# Patient Record
Sex: Female | Born: 1940 | Race: Black or African American | Hispanic: No | Smoking: Former smoker
Health system: Southern US, Community
[De-identification: ages and names within clinical notes are randomized; demographics above are authoritative.]

## PROBLEM LIST (undated history)

## (undated) DIAGNOSIS — E039 Hypothyroidism, unspecified: Secondary | ICD-10-CM

## (undated) DIAGNOSIS — N6019 Diffuse cystic mastopathy of unspecified breast: Secondary | ICD-10-CM

## (undated) DIAGNOSIS — M549 Dorsalgia, unspecified: Secondary | ICD-10-CM

## (undated) DIAGNOSIS — I872 Venous insufficiency (chronic) (peripheral): Secondary | ICD-10-CM

## (undated) DIAGNOSIS — R200 Anesthesia of skin: Secondary | ICD-10-CM

## (undated) DIAGNOSIS — D219 Benign neoplasm of connective and other soft tissue, unspecified: Secondary | ICD-10-CM

## (undated) DIAGNOSIS — I38 Endocarditis, valve unspecified: Secondary | ICD-10-CM

## (undated) DIAGNOSIS — M1990 Unspecified osteoarthritis, unspecified site: Secondary | ICD-10-CM

## (undated) DIAGNOSIS — E278 Other specified disorders of adrenal gland: Secondary | ICD-10-CM

## (undated) DIAGNOSIS — Z8679 Personal history of other diseases of the circulatory system: Secondary | ICD-10-CM

## (undated) DIAGNOSIS — M21612 Bunion of left foot: Secondary | ICD-10-CM

## (undated) DIAGNOSIS — J302 Other seasonal allergic rhinitis: Secondary | ICD-10-CM

## (undated) DIAGNOSIS — I10 Essential (primary) hypertension: Secondary | ICD-10-CM

## (undated) DIAGNOSIS — R7303 Prediabetes: Secondary | ICD-10-CM

## (undated) DIAGNOSIS — E279 Disorder of adrenal gland, unspecified: Secondary | ICD-10-CM

## (undated) DIAGNOSIS — Z8719 Personal history of other diseases of the digestive system: Secondary | ICD-10-CM

## (undated) DIAGNOSIS — Z862 Personal history of diseases of the blood and blood-forming organs and certain disorders involving the immune mechanism: Secondary | ICD-10-CM

## (undated) DIAGNOSIS — R600 Localized edema: Secondary | ICD-10-CM

## (undated) DIAGNOSIS — K5730 Diverticulosis of large intestine without perforation or abscess without bleeding: Secondary | ICD-10-CM

## (undated) DIAGNOSIS — M2042 Other hammer toe(s) (acquired), left foot: Secondary | ICD-10-CM

## (undated) DIAGNOSIS — I350 Nonrheumatic aortic (valve) stenosis: Secondary | ICD-10-CM

## (undated) DIAGNOSIS — L309 Dermatitis, unspecified: Secondary | ICD-10-CM

## (undated) DIAGNOSIS — M2550 Pain in unspecified joint: Secondary | ICD-10-CM

## (undated) DIAGNOSIS — N83209 Unspecified ovarian cyst, unspecified side: Secondary | ICD-10-CM

## (undated) DIAGNOSIS — K8020 Calculus of gallbladder without cholecystitis without obstruction: Secondary | ICD-10-CM

## (undated) HISTORY — DX: Diffuse cystic mastopathy of unspecified breast: N60.19

## (undated) HISTORY — DX: Benign neoplasm of connective and other soft tissue, unspecified: D21.9

## (undated) HISTORY — DX: Pain in unspecified joint: M25.50

## (undated) HISTORY — PX: KNEE ARTHROSCOPY: SUR90

## (undated) HISTORY — DX: Unspecified osteoarthritis, unspecified site: M19.90

## (undated) HISTORY — DX: Bunion of left foot: M21.612

## (undated) HISTORY — DX: Other hammer toe(s) (acquired), left foot: M20.42

## (undated) HISTORY — DX: Dorsalgia, unspecified: M54.9

## (undated) HISTORY — DX: Endocarditis, valve unspecified: I38

## (undated) HISTORY — DX: Other seasonal allergic rhinitis: J30.2

## (undated) HISTORY — DX: Essential (primary) hypertension: I10

## (undated) HISTORY — DX: Unspecified ovarian cyst, unspecified side: N83.209

## (undated) HISTORY — PX: SHOULDER SURGERY: SHX246

## (undated) HISTORY — PX: REDUCTION MAMMAPLASTY: SUR839

## (undated) HISTORY — DX: Dermatitis, unspecified: L30.9

## (undated) HISTORY — DX: Hypothyroidism, unspecified: E03.9

## (undated) HISTORY — DX: Personal history of other diseases of the digestive system: Z87.19

## (undated) HISTORY — PX: TUBAL LIGATION: SHX77

## (undated) HISTORY — DX: Localized edema: R60.0

## (undated) HISTORY — DX: Other specified disorders of adrenal gland: E27.8

## (undated) HISTORY — DX: Disorder of adrenal gland, unspecified: E27.9

## (undated) HISTORY — DX: Nonrheumatic aortic (valve) stenosis: I35.0

## (undated) HISTORY — PX: OTHER SURGICAL HISTORY: SHX169

---
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
## 1974-06-13 HISTORY — PX: BREAST RECONSTRUCTION: SHX9

## 1977-06-13 HISTORY — PX: BREAST EXCISIONAL BIOPSY: SUR124

## 1977-06-13 HISTORY — PX: MASTECTOMY, PARTIAL: SHX709

## 1979-06-14 HISTORY — PX: CERVICAL CONE BIOPSY: SUR198

## 2000-04-13 ENCOUNTER — Encounter: Payer: Self-pay | Admitting: Neurosurgery

## 2000-04-13 ENCOUNTER — Ambulatory Visit (HOSPITAL_COMMUNITY): Admission: RE | Admit: 2000-04-13 | Discharge: 2000-04-13 | Payer: Self-pay | Admitting: Neurosurgery

## 2001-03-14 ENCOUNTER — Ambulatory Visit (HOSPITAL_COMMUNITY): Admission: RE | Admit: 2001-03-14 | Discharge: 2001-03-14 | Payer: Self-pay | Admitting: Family Medicine

## 2001-03-14 ENCOUNTER — Encounter: Payer: Self-pay | Admitting: Family Medicine

## 2001-04-05 ENCOUNTER — Encounter: Payer: Self-pay | Admitting: Family Medicine

## 2001-04-05 ENCOUNTER — Ambulatory Visit (HOSPITAL_COMMUNITY): Admission: RE | Admit: 2001-04-05 | Discharge: 2001-04-05 | Payer: Self-pay | Admitting: Family Medicine

## 2002-01-31 ENCOUNTER — Emergency Department (HOSPITAL_COMMUNITY): Admission: EM | Admit: 2002-01-31 | Discharge: 2002-01-31 | Payer: Self-pay | Admitting: Emergency Medicine

## 2002-08-09 ENCOUNTER — Emergency Department (HOSPITAL_COMMUNITY): Admission: EM | Admit: 2002-08-09 | Discharge: 2002-08-09 | Payer: Self-pay | Admitting: Emergency Medicine

## 2002-08-09 ENCOUNTER — Encounter: Payer: Self-pay | Admitting: Emergency Medicine

## 2006-12-25 ENCOUNTER — Telehealth (INDEPENDENT_AMBULATORY_CARE_PROVIDER_SITE_OTHER): Payer: Self-pay | Admitting: *Deleted

## 2006-12-25 ENCOUNTER — Ambulatory Visit: Payer: Self-pay | Admitting: Internal Medicine

## 2006-12-25 DIAGNOSIS — N879 Dysplasia of cervix uteri, unspecified: Secondary | ICD-10-CM | POA: Insufficient documentation

## 2006-12-25 DIAGNOSIS — E039 Hypothyroidism, unspecified: Secondary | ICD-10-CM | POA: Insufficient documentation

## 2006-12-25 DIAGNOSIS — M129 Arthropathy, unspecified: Secondary | ICD-10-CM | POA: Insufficient documentation

## 2006-12-25 DIAGNOSIS — N83209 Unspecified ovarian cyst, unspecified side: Secondary | ICD-10-CM

## 2006-12-25 DIAGNOSIS — R9431 Abnormal electrocardiogram [ECG] [EKG]: Secondary | ICD-10-CM

## 2006-12-25 DIAGNOSIS — N6019 Diffuse cystic mastopathy of unspecified breast: Secondary | ICD-10-CM

## 2006-12-25 DIAGNOSIS — R609 Edema, unspecified: Secondary | ICD-10-CM

## 2006-12-26 ENCOUNTER — Encounter (INDEPENDENT_AMBULATORY_CARE_PROVIDER_SITE_OTHER): Payer: Self-pay | Admitting: Internal Medicine

## 2006-12-28 ENCOUNTER — Ambulatory Visit: Payer: Self-pay | Admitting: Internal Medicine

## 2006-12-29 ENCOUNTER — Ambulatory Visit (HOSPITAL_COMMUNITY): Admission: RE | Admit: 2006-12-29 | Discharge: 2006-12-29 | Payer: Self-pay | Admitting: Internal Medicine

## 2006-12-29 ENCOUNTER — Ambulatory Visit: Payer: Self-pay | Admitting: Cardiology

## 2006-12-29 ENCOUNTER — Encounter (INDEPENDENT_AMBULATORY_CARE_PROVIDER_SITE_OTHER): Payer: Self-pay | Admitting: Internal Medicine

## 2007-01-01 ENCOUNTER — Ambulatory Visit (HOSPITAL_COMMUNITY): Admission: RE | Admit: 2007-01-01 | Discharge: 2007-01-01 | Payer: Self-pay | Admitting: Internal Medicine

## 2007-01-01 ENCOUNTER — Telehealth (INDEPENDENT_AMBULATORY_CARE_PROVIDER_SITE_OTHER): Payer: Self-pay | Admitting: *Deleted

## 2007-01-02 ENCOUNTER — Telehealth (INDEPENDENT_AMBULATORY_CARE_PROVIDER_SITE_OTHER): Payer: Self-pay | Admitting: *Deleted

## 2007-01-02 ENCOUNTER — Encounter (INDEPENDENT_AMBULATORY_CARE_PROVIDER_SITE_OTHER): Payer: Self-pay | Admitting: Internal Medicine

## 2007-01-02 LAB — CONVERTED CEMR LAB
Alkaline Phosphatase: 74 units/L (ref 39–117)
BUN: 15 mg/dL (ref 6–23)
CO2: 24 meq/L (ref 19–32)
Cholesterol: 174 mg/dL (ref 0–200)
Creatinine, Ser: 0.79 mg/dL (ref 0.40–1.20)
Glucose, Bld: 85 mg/dL (ref 70–99)
HDL: 47 mg/dL (ref >39)
Total Bilirubin: 0.5 mg/dL (ref 0.3–1.2)
Total Protein: 6.9 g/dL (ref 6.0–8.3)
Triglycerides: 80 mg/dL (ref <150)
VLDL: 16 mg/dL (ref 0–40)

## 2007-01-04 ENCOUNTER — Ambulatory Visit: Payer: Self-pay | Admitting: Internal Medicine

## 2007-01-04 DIAGNOSIS — M76899 Other specified enthesopathies of unspecified lower limb, excluding foot: Secondary | ICD-10-CM | POA: Insufficient documentation

## 2007-01-04 DIAGNOSIS — L988 Other specified disorders of the skin and subcutaneous tissue: Secondary | ICD-10-CM | POA: Insufficient documentation

## 2007-01-05 ENCOUNTER — Encounter (INDEPENDENT_AMBULATORY_CARE_PROVIDER_SITE_OTHER): Payer: Self-pay | Admitting: Internal Medicine

## 2007-01-11 ENCOUNTER — Ambulatory Visit (HOSPITAL_COMMUNITY): Admission: RE | Admit: 2007-01-11 | Discharge: 2007-01-11 | Payer: Self-pay | Admitting: Internal Medicine

## 2007-01-12 ENCOUNTER — Telehealth (INDEPENDENT_AMBULATORY_CARE_PROVIDER_SITE_OTHER): Payer: Self-pay | Admitting: *Deleted

## 2007-01-16 ENCOUNTER — Encounter (INDEPENDENT_AMBULATORY_CARE_PROVIDER_SITE_OTHER): Payer: Self-pay | Admitting: Internal Medicine

## 2007-01-29 ENCOUNTER — Telehealth (INDEPENDENT_AMBULATORY_CARE_PROVIDER_SITE_OTHER): Payer: Self-pay | Admitting: *Deleted

## 2007-01-31 ENCOUNTER — Telehealth (INDEPENDENT_AMBULATORY_CARE_PROVIDER_SITE_OTHER): Payer: Self-pay | Admitting: *Deleted

## 2007-01-31 ENCOUNTER — Encounter (INDEPENDENT_AMBULATORY_CARE_PROVIDER_SITE_OTHER): Payer: Self-pay | Admitting: Internal Medicine

## 2007-02-05 ENCOUNTER — Telehealth (INDEPENDENT_AMBULATORY_CARE_PROVIDER_SITE_OTHER): Payer: Self-pay | Admitting: Internal Medicine

## 2007-02-06 ENCOUNTER — Telehealth (INDEPENDENT_AMBULATORY_CARE_PROVIDER_SITE_OTHER): Payer: Self-pay | Admitting: Internal Medicine

## 2007-02-06 ENCOUNTER — Encounter (INDEPENDENT_AMBULATORY_CARE_PROVIDER_SITE_OTHER): Payer: Self-pay | Admitting: Internal Medicine

## 2007-02-09 ENCOUNTER — Encounter (INDEPENDENT_AMBULATORY_CARE_PROVIDER_SITE_OTHER): Payer: Self-pay | Admitting: Internal Medicine

## 2007-07-10 ENCOUNTER — Ambulatory Visit: Payer: Self-pay | Admitting: Internal Medicine

## 2007-09-03 ENCOUNTER — Ambulatory Visit: Payer: Self-pay | Admitting: Internal Medicine

## 2007-09-03 DIAGNOSIS — J309 Allergic rhinitis, unspecified: Secondary | ICD-10-CM | POA: Insufficient documentation

## 2007-09-03 DIAGNOSIS — D179 Benign lipomatous neoplasm, unspecified: Secondary | ICD-10-CM | POA: Insufficient documentation

## 2007-09-06 ENCOUNTER — Encounter (INDEPENDENT_AMBULATORY_CARE_PROVIDER_SITE_OTHER): Payer: Self-pay | Admitting: Internal Medicine

## 2007-10-02 ENCOUNTER — Ambulatory Visit: Payer: Self-pay | Admitting: Internal Medicine

## 2007-10-02 DIAGNOSIS — R143 Flatulence: Secondary | ICD-10-CM

## 2007-10-02 DIAGNOSIS — H6690 Otitis media, unspecified, unspecified ear: Secondary | ICD-10-CM | POA: Insufficient documentation

## 2007-10-02 DIAGNOSIS — R142 Eructation: Secondary | ICD-10-CM

## 2007-10-02 DIAGNOSIS — R141 Gas pain: Secondary | ICD-10-CM

## 2007-10-18 ENCOUNTER — Telehealth (INDEPENDENT_AMBULATORY_CARE_PROVIDER_SITE_OTHER): Payer: Self-pay | Admitting: *Deleted

## 2007-12-19 ENCOUNTER — Telehealth (INDEPENDENT_AMBULATORY_CARE_PROVIDER_SITE_OTHER): Payer: Self-pay | Admitting: Internal Medicine

## 2007-12-25 ENCOUNTER — Ambulatory Visit: Payer: Self-pay | Admitting: Internal Medicine

## 2007-12-25 DIAGNOSIS — R209 Unspecified disturbances of skin sensation: Secondary | ICD-10-CM

## 2007-12-25 DIAGNOSIS — M79609 Pain in unspecified limb: Secondary | ICD-10-CM | POA: Insufficient documentation

## 2007-12-25 DIAGNOSIS — I10 Essential (primary) hypertension: Secondary | ICD-10-CM

## 2007-12-25 DIAGNOSIS — L259 Unspecified contact dermatitis, unspecified cause: Secondary | ICD-10-CM

## 2007-12-26 ENCOUNTER — Encounter (INDEPENDENT_AMBULATORY_CARE_PROVIDER_SITE_OTHER): Payer: Self-pay | Admitting: Internal Medicine

## 2007-12-26 LAB — CONVERTED CEMR LAB
BUN: 12 mg/dL (ref 6–23)
Cholesterol: 172 mg/dL (ref 0–200)
Creatinine, Ser: 0.83 mg/dL (ref 0.40–1.20)
Free T4: 1.58 ng/dL (ref 0.89–1.80)
Glucose, Bld: 88 mg/dL (ref 70–99)
LDL Cholesterol: 109 mg/dL — ABNORMAL HIGH (ref 0–99)
Potassium: 4.5 meq/L (ref 3.5–5.3)
VLDL: 20 mg/dL (ref 0–40)

## 2007-12-28 ENCOUNTER — Ambulatory Visit (HOSPITAL_COMMUNITY): Admission: RE | Admit: 2007-12-28 | Discharge: 2007-12-28 | Payer: Self-pay | Admitting: Internal Medicine

## 2008-01-11 ENCOUNTER — Encounter (INDEPENDENT_AMBULATORY_CARE_PROVIDER_SITE_OTHER): Payer: Self-pay | Admitting: Internal Medicine

## 2008-01-18 ENCOUNTER — Encounter (INDEPENDENT_AMBULATORY_CARE_PROVIDER_SITE_OTHER): Payer: Self-pay | Admitting: Internal Medicine

## 2008-01-22 ENCOUNTER — Telehealth: Payer: Self-pay | Admitting: Family Medicine

## 2008-01-30 ENCOUNTER — Ambulatory Visit: Payer: Self-pay | Admitting: Vascular Surgery

## 2008-02-11 ENCOUNTER — Ambulatory Visit: Payer: Self-pay | Admitting: Internal Medicine

## 2008-02-11 DIAGNOSIS — R252 Cramp and spasm: Secondary | ICD-10-CM

## 2008-02-13 ENCOUNTER — Encounter (INDEPENDENT_AMBULATORY_CARE_PROVIDER_SITE_OTHER): Payer: Self-pay | Admitting: Internal Medicine

## 2008-02-13 LAB — CONVERTED CEMR LAB
CO2: 26 meq/L (ref 19–32)
Calcium: 9.3 mg/dL (ref 8.4–10.5)
Magnesium: 2.2 mg/dL (ref 1.5–2.5)
Potassium: 4.3 meq/L (ref 3.5–5.3)
Sodium: 142 meq/L (ref 135–145)

## 2008-06-25 ENCOUNTER — Ambulatory Visit: Payer: Self-pay | Admitting: Internal Medicine

## 2008-07-23 ENCOUNTER — Encounter (INDEPENDENT_AMBULATORY_CARE_PROVIDER_SITE_OTHER): Payer: Self-pay | Admitting: Internal Medicine

## 2008-08-08 ENCOUNTER — Encounter (INDEPENDENT_AMBULATORY_CARE_PROVIDER_SITE_OTHER): Payer: Self-pay | Admitting: Internal Medicine

## 2008-08-15 ENCOUNTER — Ambulatory Visit: Payer: Self-pay | Admitting: Internal Medicine

## 2008-11-26 ENCOUNTER — Ambulatory Visit: Payer: Self-pay | Admitting: Internal Medicine

## 2008-11-26 DIAGNOSIS — N390 Urinary tract infection, site not specified: Secondary | ICD-10-CM | POA: Insufficient documentation

## 2008-11-26 LAB — CONVERTED CEMR LAB
Glucose, Urine, Semiquant: NEGATIVE
pH: 5.5

## 2008-11-27 ENCOUNTER — Encounter (INDEPENDENT_AMBULATORY_CARE_PROVIDER_SITE_OTHER): Payer: Self-pay | Admitting: Internal Medicine

## 2008-11-27 LAB — CONVERTED CEMR LAB: TSH: 2.429 microintl units/mL (ref 0.350–4.500)

## 2009-01-19 ENCOUNTER — Encounter: Payer: Self-pay | Admitting: Orthopedic Surgery

## 2009-01-19 ENCOUNTER — Ambulatory Visit (HOSPITAL_COMMUNITY): Admission: RE | Admit: 2009-01-19 | Discharge: 2009-01-19 | Payer: Self-pay | Admitting: Family Medicine

## 2009-01-22 ENCOUNTER — Encounter: Payer: Self-pay | Admitting: Orthopedic Surgery

## 2009-01-22 ENCOUNTER — Ambulatory Visit (HOSPITAL_COMMUNITY): Admission: RE | Admit: 2009-01-22 | Discharge: 2009-01-22 | Payer: Self-pay | Admitting: Family Medicine

## 2009-01-28 ENCOUNTER — Ambulatory Visit: Payer: Self-pay | Admitting: Orthopedic Surgery

## 2009-01-28 DIAGNOSIS — M25569 Pain in unspecified knee: Secondary | ICD-10-CM

## 2009-01-28 DIAGNOSIS — M1710 Unilateral primary osteoarthritis, unspecified knee: Secondary | ICD-10-CM

## 2009-01-28 DIAGNOSIS — M23302 Other meniscus derangements, unspecified lateral meniscus, unspecified knee: Secondary | ICD-10-CM

## 2009-02-04 ENCOUNTER — Telehealth: Payer: Self-pay | Admitting: Orthopedic Surgery

## 2009-02-06 ENCOUNTER — Ambulatory Visit (HOSPITAL_COMMUNITY): Admission: RE | Admit: 2009-02-06 | Discharge: 2009-02-06 | Payer: Self-pay | Admitting: Orthopedic Surgery

## 2009-02-06 ENCOUNTER — Ambulatory Visit: Payer: Self-pay | Admitting: Orthopedic Surgery

## 2009-02-11 ENCOUNTER — Encounter (HOSPITAL_COMMUNITY): Admission: RE | Admit: 2009-02-11 | Discharge: 2009-03-11 | Payer: Self-pay | Admitting: Orthopedic Surgery

## 2009-02-11 ENCOUNTER — Encounter: Payer: Self-pay | Admitting: Orthopedic Surgery

## 2009-02-13 ENCOUNTER — Encounter: Payer: Self-pay | Admitting: Orthopedic Surgery

## 2009-02-13 ENCOUNTER — Telehealth: Payer: Self-pay | Admitting: Orthopedic Surgery

## 2009-02-17 ENCOUNTER — Telehealth: Payer: Self-pay | Admitting: Orthopedic Surgery

## 2009-02-20 ENCOUNTER — Ambulatory Visit: Payer: Self-pay | Admitting: Internal Medicine

## 2009-02-24 ENCOUNTER — Ambulatory Visit: Payer: Self-pay | Admitting: Orthopedic Surgery

## 2009-03-13 ENCOUNTER — Encounter (HOSPITAL_COMMUNITY): Admission: RE | Admit: 2009-03-13 | Discharge: 2009-04-12 | Payer: Self-pay | Admitting: Orthopedic Surgery

## 2009-03-13 ENCOUNTER — Encounter: Payer: Self-pay | Admitting: Orthopedic Surgery

## 2009-04-10 ENCOUNTER — Encounter: Payer: Self-pay | Admitting: Orthopedic Surgery

## 2009-04-14 ENCOUNTER — Encounter: Payer: Self-pay | Admitting: Orthopedic Surgery

## 2009-04-27 ENCOUNTER — Ambulatory Visit: Payer: Self-pay | Admitting: Orthopedic Surgery

## 2009-07-28 ENCOUNTER — Ambulatory Visit: Payer: Self-pay | Admitting: Orthopedic Surgery

## 2010-04-13 ENCOUNTER — Encounter: Payer: Self-pay | Admitting: Orthopedic Surgery

## 2010-05-04 ENCOUNTER — Ambulatory Visit (HOSPITAL_COMMUNITY)
Admission: RE | Admit: 2010-05-04 | Discharge: 2010-05-04 | Payer: Self-pay | Source: Home / Self Care | Admitting: Family Medicine

## 2010-06-16 ENCOUNTER — Encounter: Payer: Self-pay | Admitting: Orthopedic Surgery

## 2010-06-16 ENCOUNTER — Ambulatory Visit
Admission: RE | Admit: 2010-06-16 | Discharge: 2010-06-16 | Payer: Self-pay | Source: Home / Self Care | Attending: Orthopedic Surgery | Admitting: Orthopedic Surgery

## 2010-06-23 ENCOUNTER — Encounter: Payer: Self-pay | Admitting: Orthopedic Surgery

## 2010-06-24 ENCOUNTER — Other Ambulatory Visit
Admission: RE | Admit: 2010-06-24 | Discharge: 2010-06-24 | Payer: Self-pay | Source: Home / Self Care | Admitting: Obstetrics & Gynecology

## 2010-06-25 ENCOUNTER — Telehealth: Payer: Self-pay | Admitting: Orthopedic Surgery

## 2010-06-29 ENCOUNTER — Ambulatory Visit (HOSPITAL_COMMUNITY): Admission: RE | Admit: 2010-06-29 | Payer: Self-pay | Source: Home / Self Care | Admitting: Obstetrics & Gynecology

## 2010-06-29 ENCOUNTER — Telehealth: Payer: Self-pay | Admitting: Orthopedic Surgery

## 2010-06-29 ENCOUNTER — Telehealth (INDEPENDENT_AMBULATORY_CARE_PROVIDER_SITE_OTHER): Payer: Self-pay | Admitting: *Deleted

## 2010-07-04 ENCOUNTER — Encounter: Payer: Self-pay | Admitting: Internal Medicine

## 2010-07-05 ENCOUNTER — Encounter: Payer: Self-pay | Admitting: Internal Medicine

## 2010-07-05 ENCOUNTER — Encounter: Payer: Self-pay | Admitting: Family Medicine

## 2010-07-08 ENCOUNTER — Encounter (HOSPITAL_COMMUNITY)
Admission: RE | Admit: 2010-07-08 | Discharge: 2010-07-13 | Payer: Self-pay | Source: Home / Self Care | Attending: Orthopedic Surgery | Admitting: Orthopedic Surgery

## 2010-07-13 ENCOUNTER — Telehealth: Payer: Self-pay | Admitting: Orthopedic Surgery

## 2010-07-13 NOTE — Letter (Signed)
Summary: M  M   Imported By: Jacklynn Ganong 05/13/2010 14:08:30  _____________________________________________________________________  External Attachment:    Type:   Image     Comment:   External Document

## 2010-07-13 NOTE — Assessment & Plan Note (Signed)
Summary: FOL UP LT KNEE/HAD ARTH SURG AUG2010/SEC HORIZ/CAF   Visit Type:  Follow-up Referring Provider:  Dr Loleta Chance  CC:  left knee pain.  History of Present Illness:  DOS: 8.27.2010  status post arthroscopy LEFT knee.  She wanted to do her own therapy she said she had too much to do and could not really go to the therapy sessions.  She comes back today saying her knee is tight and doesn't quite feel RIGHT and will go down.  She has a flexion contracture and lack of therapy after surgery  She has an anterior LEFT knee lipoma just superior to the patella it is bothering her and she wants it removed  She has a RIGHT shoulder lipoma and she also has a RIGHT medial ankle lipoma.  She would eventually like all of these removed.      Allergies: No Known Drug Allergies  Past History:  Past Medical History: Last updated: Feb 21, 2009 Hypothyroidism ? arthritis fibrocystic breast ovarian cyst genital herpes--with infrequent outbreaks hypertension Allergic rhinitis eczema  Family History: Last updated: 2009-02-21 father-deceased-pancreatic cancer mother-84-HTN,diet controlled DM, CVA x3, dementia brother-64-Crohns disease sister-62 sister-60-MS, DM 5 other siblings son-47 daughter-39 son-39 son-38-HTN daughter-38-hiatal hernia daughter-30 FH of Cancer:  Family History of Diabetes Family History of Arthritis  Past Surgical History: Tubal ligation-1979 L "partial mastectomy" for breast reduction breast reconstruction/? Coopers ligament [phone] cervix removal secondary to dysplasia--1993 Lipoma excision 1993--right shoulder arthroscopy LEFT knee, Dr. Romeo Apple, 2010.  Review of Systems MS:  See HPI. Derm:  See HPI.  Physical Exam  Additional Exam:  Examination of the LEFT knee there is a superior lipoma over the superior aspect of the patella over the quad tendon.  It is tender.  It is large proximally 4 cm x 3 cm its in the subcutaneous tissue.  She does  ambulate with a flexion contracture type gait.  Has no tenderness around the lateral knee or superior medial knee but the medial soft tissue structures are tender the joint lines a little tender the knee is stable she has full flexion her muscle strength is normal  RIGHT shoulder skin old scar and another lipoma under the scar.  This was previously removed 20 years ago.  Medial LEFT ankle and another lipoma large 5 x 4 cm  Reflexes are good sensation is normal she joined x3 mood is normal  Pulses are good in the upper and lower extremities  Mood and affect normal  Normal development attrition grooming hygiene body habitus large.   Impression & Recommendations:  Problem # 1:  LIPOMA (ICD-214.9) Assessment New  multiple lipomas LEFT knee, medial RIGHT ankle, RIGHT shoulder anteriorly  She will schedule removal of the LEFT knee lipoma and then removal of the shoulder and ankle lipoma and a separate sitting  Orders: Est. 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 One by mouth q 4 hrs as needed pain  Orders: Est. Patient Level IV (91478)  Patient Instructions: 1)  Please do exercises on sheet every other day 2)  Do prone hang every day for 30 minutes (the one with you laying on your stomach and letting your leg hand) 3)  call us for date to have lipoma excised from left knee

## 2010-07-14 ENCOUNTER — Encounter: Payer: Self-pay | Admitting: Obstetrics & Gynecology

## 2010-07-14 ENCOUNTER — Ambulatory Visit (HOSPITAL_COMMUNITY): Payer: Self-pay

## 2010-07-15 ENCOUNTER — Other Ambulatory Visit: Payer: Self-pay | Admitting: Obstetrics & Gynecology

## 2010-07-15 DIAGNOSIS — Z139 Encounter for screening, unspecified: Secondary | ICD-10-CM

## 2010-07-15 NOTE — Progress Notes (Signed)
Summary: phone with patient fol'g her contact to insurer  Phone Note Call from Patient   Caller: Patient Summary of Call: Call back from patient fol'g her contact to insurer on Fri, 06/25/10.  States spoke w/Barbara at [phone], and that insurance needs more clinical information.  Advised patient that denial letter received did not indicate that additional clinicals needed.  NOTE:  Case #[id number] Initial call taken by: Cammie Sickle,  June 29, 2010 6:08 PM Call placed to: Patient  Follow-up for Phone Call        Per contact w/insurer,provider line # [phone], spoke w/Ms. Sharlett Iles, clinical support specialist, transf'd to Mr. Aurora Mask, who states patient may have spoken w/either customer service or w/Appeals directly.  States patient can start a 1st level appeal if she has not already done so. Called back to patient; left voice mail message. Follow-up by: Cammie Sickle,  June 29, 2010 6:11 PM  Additional Follow-up for Phone Call Additional follow up Details #1::        Called back to patient to fol/up, left message w/ patient's brother. Additional Follow-up by: Cammie Sickle,  July 09, 2010 11:35 AM

## 2010-07-15 NOTE — Progress Notes (Signed)
Summary: spoke with patient, MRI denied by insurer  Phone Note Call from Patient   Summary of Call: 06/24/10 called patient to relay that, per insurer [employer],  MRI is denied. Lft message. 06/25/10 - patient called back.  I advised of denial per letter received-insurance company sending copy of letter to patient.   Patient will fol/up with her insurance company. Initial call taken by: Cammie Sickle,  June 25, 2010 11:10 AM

## 2010-07-15 NOTE — Letter (Signed)
Summary: HISTORY FORM   HISTORY FORM   Imported By: Eugenio Hoes 06/16/2010 12:24:13  _____________________________________________________________________  External Attachment:    Type:   Image     Comment:   External Document

## 2010-07-15 NOTE — Assessment & Plan Note (Signed)
Summary: rt knee pain/needs xray/medicare   Visit Type:  new problem Referring Provider:  Dr Loleta Chance  CC:  right knee pain.  History of Present Illness: I saw Erin Vazquez in the office today for an initial visit.  She is a 70 years old woman with the complaint of:  right knee  Medications: Levothyroxin 75 micrograms, Ibuprofen.  This is a 70 year old female had a LEFT knee arthroscopy in the past presents now with 3 weeks of severe RIGHT knee pain on the medial side with inability to ambulate without a significant limp and significant pain, which she rates 8/10 and described as sharp with locking and catching and severe swelling. Symptoms seemed to start with wearing the shape. Since that time. She has got a pair of orthotics from the [employer] store, and a new pair of shoes, and that seems to have taken away some of her calf and posterior thigh pain.    Allergies: No Known Drug Allergies  Review of Systems Constitutional:  Denies weight loss, weight gain, fever, chills, and fatigue. Cardiovascular:  Denies chest pain, palpitations, fainting, and murmurs. Respiratory:  Denies short of breath, wheezing, couch, tightness, pain on inspiration, and snoring . Gastrointestinal:  Denies heartburn, nausea, vomiting, diarrhea, constipation, and blood in your stools. Genitourinary:  Denies frequency, urgency, difficulty urinating, painful urination, flank pain, and bleeding in urine. Neurologic:  Complains of tingling; denies numbness, unsteady gait, dizziness, tremors, and seizure. Musculoskeletal:  Complains of joint pain, swelling, instability, stiffness, and muscle pain; denies redness and heat. Endocrine:  Denies excessive thirst, exessive urination, and heat or cold intolerance. Psychiatric:  Denies nervousness, depression, anxiety, and hallucinations. Skin:  Denies changes in the skin, poor healing, rash, itching, and redness. HEENT:  Denies blurred or double vision, eye pain, redness, and  watering. Immunology:  Denies seasonal allergies, sinus problems, and allergic to bee stings. Hemoatologic:  Denies easy bleeding and brusing.  Physical Exam  Additional Exam:  GEN: well developed, well nourished, normal grooming and hygiene, no deformity and normal body habitus.   CDV: pulses are normal, no edema, no erythema. no tenderness  Lymph: normal lymph nodes   Skin: no rashes, skin lesions or open sores   NEURO: normal coordination, reflexes, sensation.   Psyche: awake, alert and oriented. Mood normal   Gait: slightly abnormal with a slight limp.  RIGHT knee medial joint line tenderness is severe and significant. Her range of motion is approximately 115 with leg morphology preventing further flexion. She has positive McMurray sign. Normal motor strength and knee stability.   The upper extremities have normal appearance, ROM, strength and stability.      Impression & Recommendations:  Problem # 1:  DERANGEMENT MENISCUS (ICD-717.5)  Torn medial meniscus. Previous x-rays show symmetric joint space narrowing mild.  Recommend MRI.  Recommend Ultracet q.4 p.r.n. may take up to 2 tablets, #40, one refill  Orders: Est. Patient Level IV (29528)  Patient Instructions: 1)  mri come back for review   Orders Added: 1)  Est. Patient Level IV [41324]

## 2010-07-15 NOTE — Progress Notes (Signed)
Summary: Knee is still hurting a lot  Phone Note Call from Patient   Summary of Call: Erin Vazquez (02/26/1941) says the Tramadol has not helped her knee pain much at all.  She has been taking 800 mg  of Ibuprofen which has helped but she does not want to continue taking it because of the potential effects on her  liver.  The MRI was denied by her insurance because they require exercise and medicine to reduce swelling before an MRI is ok'd.  She says she needs to know what is wrong with her knee and asked if she can schedule another appointment or do you have other suggestions.  She is in a lot of pain.Please advise. Her # [phone] Initial call taken by: Jacklynn Ganong,  June 29, 2010 11:50 AM  Follow-up for Phone Call        continu the ibuprofen it has the least postential for problems  Follow-up by: Fuller Canada MD,  June 29, 2010 11:54 AM  Additional Follow-up for Phone Call Additional follow up Details #1::        She is concerned that she may have a torn meniscus. Has seen a chiropractor about 3 months for manipulation,electrode and home exercises and wants to know if the exercises would help or hinder a torn meniscus.  Said she will go to physical therapy if you think it would help her pain and then possibly get the MRI approved.  Said she will need pain medicine if she does PT Additional Follow-up by: Jacklynn Ganong,  June 29, 2010 12:15 PM    Additional Follow-up for Phone Call Additional follow up Details #2::    therapy is ok   again advise ibuprofen  Follow-up by: Fuller Canada MD,  June 29, 2010 12:23 PM  Additional Follow-up for Phone Call Additional follow up Details #3:: Details for Additional Follow-up Action Taken: Patient wants to go to [facility] for therapy, advised to take Ibuprofen  ok faxed order to [facility] Additional Follow-up by: Jacklynn Ganong,  June 29, 2010 1:31 PM

## 2010-07-15 NOTE — Letter (Signed)
Summary: [employer] Un.Healthcare denial  [place] Un.Healthcare denial   Imported By: Cammie Sickle 06/25/2010 16:22:50  _____________________________________________________________________  External Attachment:    Type:   Image     Comment:   External Document

## 2010-07-20 ENCOUNTER — Ambulatory Visit (HOSPITAL_COMMUNITY)
Admission: RE | Admit: 2010-07-20 | Discharge: 2010-07-20 | Disposition: A | Discharge status: Home / Self Care | Payer: Medicare Other | Source: Ambulatory Visit | Attending: Orthopedic Surgery | Admitting: Orthopedic Surgery

## 2010-07-20 ENCOUNTER — Ambulatory Visit (HOSPITAL_COMMUNITY)
Admission: RE | Admit: 2010-07-20 | Discharge: 2010-07-20 | Disposition: A | Discharge status: Home / Self Care | Payer: Medicare Other | Source: Ambulatory Visit | Attending: Obstetrics & Gynecology | Admitting: Obstetrics & Gynecology

## 2010-07-20 DIAGNOSIS — Z139 Encounter for screening, unspecified: Secondary | ICD-10-CM

## 2010-07-20 DIAGNOSIS — IMO0001 Reserved for inherently not codable concepts without codable children: Secondary | ICD-10-CM | POA: Insufficient documentation

## 2010-07-20 DIAGNOSIS — M25569 Pain in unspecified knee: Secondary | ICD-10-CM | POA: Insufficient documentation

## 2010-07-20 DIAGNOSIS — M25669 Stiffness of unspecified knee, not elsewhere classified: Secondary | ICD-10-CM | POA: Insufficient documentation

## 2010-07-20 DIAGNOSIS — M6281 Muscle weakness (generalized): Secondary | ICD-10-CM | POA: Insufficient documentation

## 2010-07-20 DIAGNOSIS — R262 Difficulty in walking, not elsewhere classified: Secondary | ICD-10-CM | POA: Insufficient documentation

## 2010-07-20 DIAGNOSIS — Z1231 Encounter for screening mammogram for malignant neoplasm of breast: Secondary | ICD-10-CM | POA: Insufficient documentation

## 2010-07-21 ENCOUNTER — Ambulatory Visit (HOSPITAL_COMMUNITY)
Admission: RE | Admit: 2010-07-21 | Discharge: 2010-07-21 | Disposition: A | Discharge status: Home / Self Care | Payer: Medicare Other | Source: Ambulatory Visit

## 2010-07-21 NOTE — Progress Notes (Signed)
Summary: [facility] insurance company will request records  Phone Note Call from Patient   Summary of Call: Patient says she received a letter from [employer] (auto ins) and the insurance company is going to request her records before she continues with any further treatment. Initial call taken by: Jacklynn Ganong,  July 13, 2010 11:49 AM

## 2010-07-23 ENCOUNTER — Ambulatory Visit (HOSPITAL_COMMUNITY)
Admission: RE | Admit: 2010-07-23 | Discharge: 2010-07-23 | Disposition: A | Discharge status: Home / Self Care | Payer: Medicare Other | Source: Ambulatory Visit

## 2010-07-27 ENCOUNTER — Ambulatory Visit (HOSPITAL_COMMUNITY)
Admission: RE | Admit: 2010-07-27 | Discharge: 2010-07-27 | Disposition: A | Discharge status: Home / Self Care | Payer: Medicare Other | Source: Ambulatory Visit | Attending: Physical Therapy | Admitting: Physical Therapy

## 2010-07-28 ENCOUNTER — Ambulatory Visit (HOSPITAL_COMMUNITY)
Admission: RE | Admit: 2010-07-28 | Discharge: 2010-07-28 | Disposition: A | Discharge status: Home / Self Care | Payer: Medicare Other | Source: Ambulatory Visit | Attending: Orthopedic Surgery | Admitting: Orthopedic Surgery

## 2010-07-28 DIAGNOSIS — IMO0001 Reserved for inherently not codable concepts without codable children: Secondary | ICD-10-CM | POA: Insufficient documentation

## 2010-07-28 DIAGNOSIS — M25669 Stiffness of unspecified knee, not elsewhere classified: Secondary | ICD-10-CM | POA: Insufficient documentation

## 2010-07-28 DIAGNOSIS — R262 Difficulty in walking, not elsewhere classified: Secondary | ICD-10-CM | POA: Insufficient documentation

## 2010-07-28 DIAGNOSIS — M25569 Pain in unspecified knee: Secondary | ICD-10-CM | POA: Insufficient documentation

## 2010-07-28 DIAGNOSIS — M6281 Muscle weakness (generalized): Secondary | ICD-10-CM | POA: Insufficient documentation

## 2010-07-30 ENCOUNTER — Ambulatory Visit (HOSPITAL_COMMUNITY)
Admission: RE | Admit: 2010-07-30 | Discharge: 2010-07-30 | Disposition: A | Discharge status: Home / Self Care | Payer: MEDICARE | Source: Ambulatory Visit | Attending: Orthopedic Surgery | Admitting: Orthopedic Surgery

## 2010-07-30 DIAGNOSIS — IMO0001 Reserved for inherently not codable concepts without codable children: Secondary | ICD-10-CM | POA: Insufficient documentation

## 2010-07-30 DIAGNOSIS — M25669 Stiffness of unspecified knee, not elsewhere classified: Secondary | ICD-10-CM | POA: Insufficient documentation

## 2010-07-30 DIAGNOSIS — M25569 Pain in unspecified knee: Secondary | ICD-10-CM | POA: Insufficient documentation

## 2010-08-03 ENCOUNTER — Ambulatory Visit (HOSPITAL_COMMUNITY): Payer: MEDICARE | Admitting: Physical Therapy

## 2010-08-04 ENCOUNTER — Ambulatory Visit (HOSPITAL_COMMUNITY)
Admission: RE | Admit: 2010-08-04 | Discharge: 2010-08-04 | Disposition: A | Discharge status: Home / Self Care | Payer: Medicare Other | Source: Ambulatory Visit | Attending: *Deleted | Admitting: *Deleted

## 2010-08-05 ENCOUNTER — Ambulatory Visit (HOSPITAL_COMMUNITY)
Admission: RE | Admit: 2010-08-05 | Discharge: 2010-08-05 | Disposition: A | Discharge status: Home / Self Care | Payer: Medicare Other | Source: Ambulatory Visit | Attending: *Deleted | Admitting: *Deleted

## 2010-08-09 ENCOUNTER — Ambulatory Visit (HOSPITAL_COMMUNITY)
Admission: RE | Admit: 2010-08-09 | Discharge: 2010-08-09 | Disposition: A | Discharge status: Home / Self Care | Payer: Medicare Other | Source: Ambulatory Visit | Attending: *Deleted | Admitting: *Deleted

## 2010-08-11 ENCOUNTER — Ambulatory Visit (HOSPITAL_COMMUNITY)
Admission: RE | Admit: 2010-08-11 | Discharge: 2010-08-11 | Disposition: A | Discharge status: Home / Self Care | Payer: Medicare Other | Source: Ambulatory Visit | Attending: Orthopedic Surgery | Admitting: Orthopedic Surgery

## 2010-08-13 ENCOUNTER — Inpatient Hospital Stay (HOSPITAL_COMMUNITY): Admission: RE | Admit: 2010-08-13 | Payer: MEDICARE | Source: Ambulatory Visit

## 2010-08-16 ENCOUNTER — Ambulatory Visit (HOSPITAL_COMMUNITY)
Admission: RE | Admit: 2010-08-16 | Discharge: 2010-08-16 | Disposition: A | Discharge status: Home / Self Care | Payer: Medicare Other | Source: Ambulatory Visit | Attending: Orthopedic Surgery | Admitting: Orthopedic Surgery

## 2010-08-16 DIAGNOSIS — M25569 Pain in unspecified knee: Secondary | ICD-10-CM | POA: Insufficient documentation

## 2010-08-16 DIAGNOSIS — IMO0001 Reserved for inherently not codable concepts without codable children: Secondary | ICD-10-CM | POA: Insufficient documentation

## 2010-08-16 DIAGNOSIS — M25669 Stiffness of unspecified knee, not elsewhere classified: Secondary | ICD-10-CM | POA: Insufficient documentation

## 2010-08-16 DIAGNOSIS — R262 Difficulty in walking, not elsewhere classified: Secondary | ICD-10-CM | POA: Insufficient documentation

## 2010-08-16 DIAGNOSIS — M6281 Muscle weakness (generalized): Secondary | ICD-10-CM | POA: Insufficient documentation

## 2010-08-18 ENCOUNTER — Ambulatory Visit (HOSPITAL_COMMUNITY)
Admission: RE | Admit: 2010-08-18 | Discharge: 2010-08-18 | Disposition: A | Discharge status: Home / Self Care | Payer: Medicare Other | Source: Ambulatory Visit | Attending: *Deleted | Admitting: *Deleted

## 2010-08-18 ENCOUNTER — Encounter: Payer: Self-pay | Admitting: Orthopedic Surgery

## 2010-08-19 ENCOUNTER — Ambulatory Visit (HOSPITAL_COMMUNITY)
Admission: RE | Admit: 2010-08-19 | Discharge: 2010-08-19 | Disposition: A | Discharge status: Home / Self Care | Payer: Medicare Other | Source: Ambulatory Visit | Attending: *Deleted | Admitting: *Deleted

## 2010-08-27 ENCOUNTER — Telehealth: Payer: Self-pay | Admitting: Orthopedic Surgery

## 2010-08-30 ENCOUNTER — Encounter: Payer: Self-pay | Admitting: Orthopedic Surgery

## 2010-08-31 NOTE — Miscellaneous (Signed)
Summary: Rehab Report  Rehab Report   Imported By: Cammie Sickle 08/26/2010 20:04:36  _____________________________________________________________________  External Attachment:    Type:   Image     Comment:   External Document

## 2010-09-06 ENCOUNTER — Telehealth: Payer: Self-pay | Admitting: *Deleted

## 2010-09-06 NOTE — Telephone Encounter (Signed)
Waiting on response from insurance company.

## 2010-09-07 NOTE — Telephone Encounter (Signed)
Waiting on insurance to give authorization for MRI

## 2010-09-09 NOTE — Miscellaneous (Signed)
  PT has been completed   no change in condition   still having knee pain   Clinical Lists Changes

## 2010-09-09 NOTE — Progress Notes (Signed)
Summary: Can we schedule appointment or try again to get MRI authorized?  Phone Note Call from Patient   Summary of Call: Erin Vazquez (09/24/1940) her insurance company denied the MRI you ordered because she had not had any PT.  She is now finished with PT and  is asking for an appointment to follow-up here for her knee pain.  Wants to know if we can try again to get the MRI authorized by her insurance  company, or if she can schedule an appointment here without getting the MRI.  Please advise. Her # is [phone] Initial call taken by: Jacklynn Ganong,  August 27, 2010 10:50 AM  Follow-up for Phone Call        try mri again  Follow-up by: Fuller Canada MD,  August 29, 2010 6:26 PM  Additional Follow-up for Phone Call Additional follow up Details #1::        Routed to [facility] and Bradly Chris for referral, thanks Additional Follow-up by: Jacklynn Ganong,  August 30, 2010 10:43 AM    Additional Follow-up for Phone Call Additional follow up Details #2::    I do not think they will approve it if she has not been reevaluated since having her PT. Follow-up by: Waldon Reining,  August 30, 2010 12:20 PM

## 2010-09-15 ENCOUNTER — Other Ambulatory Visit: Payer: Self-pay | Admitting: Orthopedic Surgery

## 2010-09-15 DIAGNOSIS — S83209A Unspecified tear of unspecified meniscus, current injury, unspecified knee, initial encounter: Secondary | ICD-10-CM

## 2010-09-18 LAB — CBC
Platelets: 250 10*3/uL (ref 150–400)
RDW: 14.4 % (ref 11.5–15.5)

## 2010-09-18 LAB — BASIC METABOLIC PANEL
BUN: 10 mg/dL (ref 6–23)
Calcium: 9.4 mg/dL (ref 8.4–10.5)
Creatinine, Ser: 0.83 mg/dL (ref 0.4–1.2)
GFR calc non Af Amer: >60 mL/min (ref >60)
Glucose, Bld: 80 mg/dL (ref 70–99)

## 2010-09-24 ENCOUNTER — Ambulatory Visit (HOSPITAL_COMMUNITY): Admission: RE | Admit: 2010-09-24 | Payer: Medicare Other | Source: Ambulatory Visit

## 2010-09-29 ENCOUNTER — Ambulatory Visit (HOSPITAL_COMMUNITY)
Admission: RE | Admit: 2010-09-29 | Discharge: 2010-09-29 | Disposition: A | Discharge status: Home / Self Care | Payer: Medicare Other | Source: Ambulatory Visit | Attending: Orthopedic Surgery | Admitting: Orthopedic Surgery

## 2010-09-29 DIAGNOSIS — M25569 Pain in unspecified knee: Secondary | ICD-10-CM | POA: Insufficient documentation

## 2010-09-29 DIAGNOSIS — M7120 Synovial cyst of popliteal space [Baker], unspecified knee: Secondary | ICD-10-CM | POA: Insufficient documentation

## 2010-09-29 DIAGNOSIS — M23329 Other meniscus derangements, posterior horn of medial meniscus, unspecified knee: Secondary | ICD-10-CM | POA: Insufficient documentation

## 2010-09-29 DIAGNOSIS — S83209A Unspecified tear of unspecified meniscus, current injury, unspecified knee, initial encounter: Secondary | ICD-10-CM

## 2010-10-19 ENCOUNTER — Telehealth: Payer: Self-pay | Admitting: Orthopedic Surgery

## 2010-10-19 NOTE — Telephone Encounter (Signed)
Appointment 10/20/10 with Dr Romeo Apple re-scheduled per patient (due to transportation) to 11/02/10.

## 2010-10-20 ENCOUNTER — Ambulatory Visit: Payer: Medicare Other | Admitting: Orthopedic Surgery

## 2010-10-26 NOTE — Op Note (Signed)
NAME:  Erin Vazquez, Erin Vazquez                ACCOUNT NO.:  [id number]   MEDICAL RECORD NO.:  [id number]          PATIENT TYPE:  AMB   LOCATION:  DAY                           FACILITY:  [facility]   PHYSICIAN:  Vickki Hearing, M.D.DATE OF BIRTH:  [birth year]   DATE OF PROCEDURE:  02/06/2009  DATE OF DISCHARGE:  02/06/2009                               OPERATIVE REPORT   HISTORY:  This is a 70 year old female who injured her left knee in her  yard.  She was walking.  Her flip flops got caught in a root and her  knee twisted and popped.  She felt medial knee pain and it worsened over  several days.  She also complained of some mild stiffness and aching  pain before her injury consistent with osteoarthritis.  She was taking  Vicodin for pain.  It did not help.  She had an MRI and x-rays and it  showed she had osteoarthritis, torn medial meniscus, a ruptured Baker  cyst, and extensive synovitis.   PREOPERATIVE DIAGNOSES:  Osteoarthritis, torn medial meniscus, left  knee.   POSTOPERATIVE DIAGNOSES:  Osteoarthritis, torn medial meniscus, left  knee.   PROCEDURE:  Arthroscopy left knee, partial medial meniscectomy.   SURGEON:  Vickki Hearing, M.D.   ASSISTANTS:  None.   ANESTHESIA:  General.   OPERATIVE FINDINGS:  Generalized osteoarthritis most notable on the  trochlear region, the patella, the lateral tibial plateau.  There was  also a root tear of the medial meniscus.   SPECIMENS:  There were no specimens.   COMPLICATIONS:  None.   DISPOSITION:  Counts were correct.  The patient to PACU in good  condition.   DESCRIPTION OF PROCEDURE:  Site marking was performed in the preop area,  patient identification was as well.  Chart update was done.  The patient  was taken to surgery, given Ancef.  She had general anesthesia.  Her  left leg was prepped and draped with chlorhexidine.  The right leg was  placed in a padded well leg holder.  After time-out was completed,  procedure began as  follows.   Scope was introduced through the lateral portal.  Diagnostic arthroscopy  was performed starting medially and progressing laterally and then into  the suprapatellar pouch.  Findings are as noted.   A medial working portal was established and a combination of Duckbill  forceps were used to trim the meniscal root tear until a stable rim was  created.  Meniscal fragments were removed with a motorized shaver.  A 50-  degree ArthroCare wand was used to continue balancing of the meniscus.  Meniscal stability was confirmed with a probe.   The scope was then placed in a lateral compartment and a gentle  debridement was done at the tibial plateau and then as well of the  patellofemoral trochlear region.   The knee was irrigated and injected with Marcaine with epinephrine.  Portals were closed with Steri-Strips.  Sterile dressing and Cryo Cuff  were applied.  The patient was reversed from anesthesia, taken to  recovery room in stable condition.  Therapy will  start on Wednesday.  She will be discharged with Norco 7.5 one q.4 p.r.n. for pain, #42 with  three refills.  She is discharged with a prescription for a walker.  She  is full weightbearing.  She is use her Cryo Cuff for 3 days, change the  bandage on Saturday and start knee exercises on Sunday.      Vickki Hearing, M.D.  Electronically Signed     SEH/MEDQ  D:  02/06/2009  T:  02/07/2009  Job:  [id number]

## 2010-10-26 NOTE — Procedures (Signed)
LOWER EXTREMITY VENOUS REFLUX EXAM   INDICATION:  Bilateral leg varicose veins with pain and swelling.   EXAM:  Using color-flow imaging and pulse Doppler spectral analysis, the  right and left common femoral, superficial femoral, popliteal, posterior  tibial, greater and lesser saphenous veins are evaluated.  There is no  evidence suggesting deep venous insufficiency in the right and left  lower extremity.   The right and left saphenofemoral junction is competent.  The right and  left GSV is competent with the caliber as described below.  The left  greater saphenous branch appears incompetent from mid thigh to below  knee.   The right and left proximal short saphenous vein demonstrates  competency.   GSV Diameter (used if found to be incompetent only)                                            Right    Left  Proximal Greater Saphenous Vein           cm       cm  Proximal-to-mid-thigh                     cm       cm  Mid thigh                                 cm       cm  Mid-distal thigh                          cm       cm  Distal thigh                              cm       cm  Knee                                      cm       cm   IMPRESSION:  1. Right and left greater saphenous vein appears to have no reflux.  2. The right and left greater saphenous vein is not aneurysmal.  3. The right and left greater saphenous vein is not tortuous.  The      deep system is competent.  4. The right and left lesser saphenous vein is competent.  5. There is a branch in the left mid thigh of the greater saphenous      vein which appears to have reflux.  6. No evidence of DVT noted in bilateral legs.       ___________________________________________  Larina Earthly, M.D.   MG/MEDQ  D:  01/30/2008  T:  01/30/2008  Job:  [id number]

## 2010-10-26 NOTE — Consult Note (Signed)
NAME:  Erin Vazquez, Erin Vazquez                ACCOUNT NO.:  [id number]   MEDICAL RECORD NO.:  [id number]          PATIENT TYPE:  OUT   LOCATION:  RAD                           FACILITY:  [facility]   PHYSICIAN:  Kassie Mends, M.D.      DATE OF BIRTH:  June 03, 1941   DATE OF CONSULTATION:  DATE OF DISCHARGE:                                 CONSULTATION   CHIEF COMPLAINT:  Abdominal pain, diarrhea, and reflux.   PHYSICIAN REQUESTING CONSULTATION:  W. Simone Curia, MD   HISTORY OF PRESENT ILLNESS:  Ms. Tolleson is a very pleasant 70 year old  lady who presents today for further evaluation of chronic abdominal  pain, diarrhea, and reflux.  She has a history of chronic GERD.  She  states she has been on multiple PPIs in the past.  She is taking  Prilosec 40 mg daily, Nexium 40 mg daily, Zegerid 40 mg daily, Prevacid  30 mg daily, all with minimal improvement.  She has never been on  Aciphex.  Currently, she is on Protonix and states it is not working.  She says she has heartburn daily.  Her symptoms are really worse at  night.  She often has regurgitation of fluid.  She complains some  burning.  She occasionally has dysphagia to solid foods.  Denies any  odynophagia.  She has occasional lower abdominal pain.  She states she  has had problems with a bowel movements for about 3 months.  She has had  a change to loose stools.  She has 3 and 4 every morning, sometimes in  the evenings as well.  If she eats too late, she has nocturnal diarrhea.  She denies any melena or rectal bleeding.  She never has a solid stool.  She denies any recent medication changes.  She denies any NSAID or  aspirin use.   CURRENT MEDICATIONS:  1. Verapamil 10 mg daily.  2. Metformin 500 mg b.i.d.  3. Premarin daily.  4. Protonix 40 mg daily.  5. Caltrate 600 mg plus vitamin D daily.   ALLERGIES:  SULFA and CODEINE.   PAST MEDICAL HISTORY:  1. Chronic GERD.  2. 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  She is due this week for another injection.  3. Hypertension.  4. Diabetes mellitus.  5. Back pain.  6. Hysterectomy.  7. Left breast cyst removed.  8. Left ankle surgery due to fracture.  9. Cholecystectomy.  10.Hysterectomy.  11.Colonoscopy by Dr. Karilyn Cota in 2005.  She had a transverse colon      polyp, which is benign and a very redundant colon.  12.History of IBS.  She had a tubular adenoma removed in April 2001,      at the time of colonoscopy in the mid sigmoid colon.  13.The patient has a history of pancreatitis in April 2006 after      undergoing a cholecystectomy 2 weeks prior.  MRCP was negative for      choledocholithiasis.   FAMILY HISTORY:  Mother had colon cancer and died at age 34.  SOCIAL HISTORY:  She is married, has 2 children.  She is retired from  [employer].  No tobacco, alcohol or drug use.   REVIEW OF SYSTEMS:  See HPI for GI.  CONSTITUTIONAL:  No unintentional  weight loss.  CARDIOPULMONARY:  No chest pain or shortness of breath.  GENITOURINARY:  No dysuria or hematuria.   PHYSICAL EXAMINATION:  VITAL SIGNS:  Weight 195, height 5 feet 5-1/2  inches, temperature 98, blood pressure 122/88, and pulse 80.  GENERAL:  Pleasant well-nourished, well-developed black female in no  acute distress.  SKIN:  Warm and dry.  No jaundice.  HEENT:  Sclerae nonicteric.  Peripheral mucosa moist and pink.  No  lesions, erythema or exudate.  NECK:  No lymphadenopathy or thyromegaly.  CHEST:  Lungs are clear to auscultation.  CARDIAC:  Reveals regular rate and rhythm.  Normal S1 and S2.  No  murmurs, rubs or gallops.  ABDOMEN:  Positive bowel sounds.  Abdomen is soft, nontender, and  nondistended.  No organomegaly or masses.  No rebound or guarding.  No  abdominal bruits or hernias.  LOWER EXTREMITIES:  No edema.   IMPRESSION:  The patient is very pleasant 70 year old lady who complains  of chronic gastroesophageal reflux disease with breakthrough symptoms  on  Protonix.  She has some occasional dysphagia to solid foods.  She has  failed multiple PPIs while in the course in the past.  In addition, she  has had a change in bowel movements and over the last 3 months, has had  more diarrhea.  She never has solid stool.  Her last colonoscopy was 3-  1/2 years ago and she does have history of tubular adenoma and family  history of colorectal cancer.  Based on change in bowel movements, we  will proceed with colonoscopy at this time.   PLAN:  1. Colonoscopy and EGD in the near future with Dr. Kassie Mends.  2. Stop Protonix.  Try Kapidex 60 mg daily, #15 samples provided.  3. Further recommendations to follow.      Tana Coast, P.A.      Kassie Mends, M.D.     LL/MEDQ  D:  02/04/2008  T:  02/05/2008  Job:  [id number]   cc:   Donna Bernard, M.D.  Fax: [phone]

## 2010-10-26 NOTE — Procedures (Signed)
NAME:  Erin Vazquez, Erin Vazquez                ACCOUNT NO.:  [id number]   MEDICAL RECORD NO.:  [id number]          PATIENT TYPE:  OUT   LOCATION:  RAD                           FACILITY:  [facility]   PHYSICIAN:  Gerrit Friends. Dietrich Pates, MD, FACCDATE OF BIRTH:  02/08/1941   DATE OF PROCEDURE:  12/29/2006  DATE OF DISCHARGE:                                ECHOCARDIOGRAM   REFERRING:  Erle Crocker, M.D.   CLINICAL DATA:  A 70 year old woman with EKG abnormalities and  peripheral edema suggesting congestive heart failure; history of  hypertension.   M-mode:  Aorta 3.3, left atrium 3.8, septum 1.6, posterior wall 1.3, LV  diastole 4.4, LV systole 3.3.   1. Technically adequate echocardiographic study.  2. Mild left atrial enlargement; right atrial size at the upper limit      of normal.  3. Normal right ventricular size and function.  4. Normal diameter of the proximal ascending aorta; mild calcification      of the wall and annulus.  5. Mild aortic valvular sclerosis.  6. Normal mitral, tricuspid and pulmonic valve; normal proximal      pulmonary artery.  7. Normal left ventricular size; mild to moderate hypertrophy; normal      regional and global function.  8. Normal IVC.      Gerrit Friends. Dietrich Pates, MD, [facility]  Electronically Signed     RMR/MEDQ  D:  12/29/2006  T:  12/30/2006  Job:  [id number]

## 2010-10-26 NOTE — Consult Note (Signed)
NEW PATIENT CONSULTATION   Vazquez, Erin L  DOB:  21-Sep-1940                                       01/30/2008  CHART#:[id number]   The patient presents today for evaluation of lower extremity discomfort.  She has multiple components of this.  She reports a tingling sensation  in her toes bilaterally, worse on the right than on the left, and also  reports swelling at the end of the day on both legs.  She does not have  any history of deep venous thrombosis or superficial thrombophlebitis.  She had undergone noninvasive vascular laboratory studies recently to  rule out lower extremity arterial insufficiency.  I have a copy of these  revealing a normal ankle arm indices bilaterally.  She does not have any  history of any tissue loss.  She has undergone a prior nerve conduction  study which she reported was normal and has undergone a prior CT scan  which I have for review which shows no evidence of pelvic abnormalities.  She does report tingling in her fingers and toes.   PAST MEDICAL HISTORY:  Significant for hypertension, hypothyroidism and  contact dermatitis.   SOCIAL HISTORY:  She quit smoking in 1967.  Does not drink alcohol.   PHYSICAL EXAMINATION:  General:  A well-developed, well-nourished black  female appearing her stated age of 89.  Vital signs:  Her blood pressure  136/74, pulse 71, respirations 18.  Vascular:  She does have palpable  dorsalis pedis pulses bilaterally.  She does not have any pitting edema.  She does have thickening in her lower extremities bilaterally.  She  underwent a formal venous duplex in our office today and this reveals no  evidence of reflux in her deep system.  She does have mild reflux in a  tributary branch rising off of her left great saphenous vein with no  evidence of reflux in her main great saphenous vein or small saphenous  vein.   I discussed this with the patient.  She essentially has had any arterial  or venous  pathology ruled out as the etiology for her discomfort.  I  explained the tingling sensation would typically be more related to  neuropathy than venous or arterial pathology.  She was reassured with  this and will see [name] again on an as-needed basis.  She understands the  importance of elevation when possible.   Larina Earthly, M.D.  Electronically Signed   TFE/MEDQ  D:  01/30/2008  T:  01/31/2008  Job:  [id number]   cc:   Erle Crocker, M.D.

## 2010-11-02 ENCOUNTER — Encounter: Payer: Self-pay | Admitting: Orthopedic Surgery

## 2010-11-02 ENCOUNTER — Ambulatory Visit (INDEPENDENT_AMBULATORY_CARE_PROVIDER_SITE_OTHER): Payer: Medicare Other | Admitting: Orthopedic Surgery

## 2010-11-02 DIAGNOSIS — M23302 Other meniscus derangements, unspecified lateral meniscus, unspecified knee: Secondary | ICD-10-CM

## 2010-11-02 DIAGNOSIS — IMO0002 Reserved for concepts with insufficient information to code with codable children: Secondary | ICD-10-CM

## 2010-11-02 DIAGNOSIS — M1710 Unilateral primary osteoarthritis, unspecified knee: Secondary | ICD-10-CM

## 2010-11-02 NOTE — Progress Notes (Signed)
MRI followup RIGHT knee  Complains of 3/10 pain  Currently on ibuprofen 800  MRI reviewed  Significant severe 3 compartment degenerative changes most notably medial compartment also has radial tear posterior horn medial meniscus  Discussed treatment options nonoperative treatment, arthroscopy, a replacement  Patient is not sure which route she wants to go, we'll call her back when she decides

## 2011-11-30 ENCOUNTER — Encounter: Payer: Self-pay | Admitting: Orthopedic Surgery

## 2011-11-30 ENCOUNTER — Ambulatory Visit: Payer: Medicare Other | Admitting: Orthopedic Surgery

## 2013-08-08 ENCOUNTER — Other Ambulatory Visit: Payer: Self-pay | Admitting: Obstetrics & Gynecology

## 2013-09-02 ENCOUNTER — Emergency Department (HOSPITAL_COMMUNITY)
Admission: EM | Admit: 2013-09-02 | Discharge: 2013-09-02 | Disposition: A | Discharge status: Home / Self Care | Payer: Medicare HMO | Attending: Emergency Medicine | Admitting: Emergency Medicine

## 2013-09-02 ENCOUNTER — Encounter (HOSPITAL_COMMUNITY): Payer: Self-pay | Admitting: Emergency Medicine

## 2013-09-02 ENCOUNTER — Emergency Department (HOSPITAL_COMMUNITY): Payer: Medicare HMO

## 2013-09-02 DIAGNOSIS — Z8742 Personal history of other diseases of the female genital tract: Secondary | ICD-10-CM | POA: Insufficient documentation

## 2013-09-02 DIAGNOSIS — S61209A Unspecified open wound of unspecified finger without damage to nail, initial encounter: Secondary | ICD-10-CM | POA: Insufficient documentation

## 2013-09-02 DIAGNOSIS — I10 Essential (primary) hypertension: Secondary | ICD-10-CM | POA: Insufficient documentation

## 2013-09-02 DIAGNOSIS — Y929 Unspecified place or not applicable: Secondary | ICD-10-CM | POA: Insufficient documentation

## 2013-09-02 DIAGNOSIS — E039 Hypothyroidism, unspecified: Secondary | ICD-10-CM | POA: Insufficient documentation

## 2013-09-02 DIAGNOSIS — Z79899 Other long term (current) drug therapy: Secondary | ICD-10-CM | POA: Insufficient documentation

## 2013-09-02 DIAGNOSIS — Z23 Encounter for immunization: Secondary | ICD-10-CM | POA: Insufficient documentation

## 2013-09-02 DIAGNOSIS — S61218A Laceration without foreign body of other finger without damage to nail, initial encounter: Secondary | ICD-10-CM

## 2013-09-02 DIAGNOSIS — Z87891 Personal history of nicotine dependence: Secondary | ICD-10-CM | POA: Insufficient documentation

## 2013-09-02 DIAGNOSIS — Z791 Long term (current) use of non-steroidal anti-inflammatories (NSAID): Secondary | ICD-10-CM | POA: Insufficient documentation

## 2013-09-02 DIAGNOSIS — M129 Arthropathy, unspecified: Secondary | ICD-10-CM | POA: Insufficient documentation

## 2013-09-02 DIAGNOSIS — Z8619 Personal history of other infectious and parasitic diseases: Secondary | ICD-10-CM | POA: Insufficient documentation

## 2013-09-02 DIAGNOSIS — W298XXA Contact with other powered powered hand tools and household machinery, initial encounter: Secondary | ICD-10-CM | POA: Insufficient documentation

## 2013-09-02 DIAGNOSIS — Y9389 Activity, other specified: Secondary | ICD-10-CM | POA: Insufficient documentation

## 2013-09-02 DIAGNOSIS — Z872 Personal history of diseases of the skin and subcutaneous tissue: Secondary | ICD-10-CM | POA: Insufficient documentation

## 2013-09-02 MED ORDER — CEPHALEXIN 500 MG PO CAPS: 500.0000 mg | ORAL_CAPSULE | Freq: Three times a day (TID) | ORAL | Status: DC

## 2013-09-02 MED ORDER — BUPIVACAINE HCL (PF) 0.5 % IJ SOLN
INTRAMUSCULAR | Status: AC
  Administered 2013-09-02: 30 mL
  Filled 2013-09-02: qty 30

## 2013-09-02 MED ORDER — BUPIVACAINE HCL (PF) 0.5 % IJ SOLN
30.0000 mL | Freq: Once | INTRAMUSCULAR | Status: AC
  Administered 2013-09-02: 30 mL

## 2013-09-02 MED ORDER — TETANUS-DIPHTH-ACELL PERTUSSIS 5-2.5-18.5 LF-MCG/0.5 IM SUSP
0.5000 mL | Freq: Once | INTRAMUSCULAR | Status: AC
  Administered 2013-09-02: 0.5 mL via INTRAMUSCULAR
  Filled 2013-09-02: qty 0.5

## 2013-09-02 MED ORDER — OXYCODONE-ACETAMINOPHEN 5-325 MG PO TABS
1.0000 | ORAL_TABLET | Freq: Once | ORAL | Status: DC
  Filled 2013-09-02: qty 1

## 2013-09-02 MED ORDER — HYDROCODONE-ACETAMINOPHEN 5-325 MG PO TABS: ORAL_TABLET | ORAL | Status: DC

## 2013-09-02 MED ORDER — NAPROXEN 375 MG PO TABS: ORAL_TABLET | ORAL | Status: DC

## 2013-09-02 NOTE — Discharge Instructions (Signed)
Elevate your hand. Use ice packs for pain. Take the pain medications as prescribed. Take the antibiotic to prevent infection. The sutures need to be removed in about 12 days. Recheck sooner for any signs of infection such as increased swelling, pain, drainage of pus, red streaks or fever. You should have the wound rechecked in the next 2-3 days.     Sutured Wound Care Sutures are stitches that can be used to close wounds. Wound care helps prevent pain and infection.  HOME CARE INSTRUCTIONS   Rest and elevate the injured area until all the pain and swelling are gone.  Only take over-the-counter or prescription medicines for pain, discomfort, or fever as directed by your caregiver.  After 48 hours, gently wash the area with mild soap and water once a day, or as directed. Rinse off the soap. Pat the area dry with a clean towel. Do not rub the wound. This may cause bleeding.  Follow your caregiver's instructions for how often to change the bandage (dressing). Stop using a dressing after 2 days or after the wound stops draining.  If the dressing sticks, moisten it with soapy water and gently remove it.  Apply ointment on the wound as directed.  Avoid stretching a sutured wound.  Drink enough fluids to keep your urine clear or pale yellow.  Follow up with your caregiver for suture removal as directed.  Use sunscreen on your wound for the next 3 to 6 months so the scar will not darken. SEEK IMMEDIATE MEDICAL CARE IF:   Your wound becomes red, swollen, hot, or tender.  You have increasing pain in the wound.  You have a red streak that extends from the wound.  There is pus coming from the wound.  You have a fever.  You have shaking chills.  There is a bad smell coming from the wound.  You have persistent bleeding from the wound. MAKE SURE YOU:   Understand these instructions.  Will watch your condition.  Will get help right away if you are not doing well or get  worse. Document Released: 07/07/2004 Document Revised: 08/22/2011 Document Reviewed: 10/03/2010 [facility] Patient Information 2014 [place], [place].    Laceration Care, Adult A laceration is a cut or lesion that goes through all layers of the skin and into the tissue just beneath the skin. TREATMENT  Some lacerations may not require closure. Some lacerations may not be able to be closed due to an increased risk of infection. It is important to see your caregiver as soon as possible after an injury to minimize the risk of infection and maximize the opportunity for successful closure. If closure is appropriate, pain medicines may be given, if needed. The wound will be cleaned to help prevent infection. Your caregiver will use stitches (sutures), staples, wound glue (adhesive), or skin adhesive strips to repair the laceration. These tools bring the skin edges together to allow for faster healing and a better cosmetic outcome. However, all wounds will heal with a scar. Once the wound has healed, scarring can be minimized by covering the wound with sunscreen during the day for 1 full year. HOME CARE INSTRUCTIONS  For sutures or staples:  Keep the wound clean and dry.  If you were given a bandage (dressing), you should change it at least once a day. Also, change the dressing if it becomes wet or dirty, or as directed by your caregiver.  Wash the wound with soap and water 2 times a day. Rinse the wound off  with water to remove all soap. Pat the wound dry with a clean towel.  After cleaning, apply a thin layer of the antibiotic ointment as recommended by your caregiver. This will help prevent infection and keep the dressing from sticking.  You may shower as usual after the first 24 hours. Do not soak the wound in water until the sutures are removed.  Only take over-the-counter or prescription medicines for pain, discomfort, or fever as directed by your caregiver.  Get your sutures or staples removed  as directed by your caregiver. For skin adhesive strips:  Keep the wound clean and dry.  Do not get the skin adhesive strips wet. You may bathe carefully, using caution to keep the wound dry.  If the wound gets wet, pat it dry with a clean towel.  Skin adhesive strips will fall off on their own. You may trim the strips as the wound heals. Do not remove skin adhesive strips that are still stuck to the wound. They will fall off in time. For wound adhesive:  You may briefly wet your wound in the shower or bath. Do not soak or scrub the wound. Do not swim. Avoid periods of heavy perspiration until the skin adhesive has fallen off on its own. After showering or bathing, gently pat the wound dry with a clean towel.  Do not apply liquid medicine, cream medicine, or ointment medicine to your wound while the skin adhesive is in place. This may loosen the film before your wound is healed.  If a dressing is placed over the wound, be careful not to apply tape directly over the skin adhesive. This may cause the adhesive to be pulled off before the wound is healed.  Avoid prolonged exposure to sunlight or tanning lamps while the skin adhesive is in place. Exposure to ultraviolet light in the first year will darken the scar.  The skin adhesive will usually remain in place for 5 to 10 days, then naturally fall off the skin. Do not pick at the adhesive film. You may need a tetanus shot if:  You cannot remember when you had your last tetanus shot.  You have never had a tetanus shot. If you get a tetanus shot, your arm may swell, get red, and feel warm to the touch. This is common and not a problem. If you need a tetanus shot and you choose not to have one, there is a rare chance of getting tetanus. Sickness from tetanus can be serious. SEEK MEDICAL CARE IF:   You have redness, swelling, or increasing pain in the wound.  You see a red line that goes away from the wound.  You have yellowish-white fluid  (pus) coming from the wound.  You have a fever.  You notice a bad smell coming from the wound or dressing.  Your wound breaks open before or after sutures have been removed.  You notice something coming out of the wound such as wood or glass.  Your wound is on your hand or foot and you cannot move a finger or toe. SEEK IMMEDIATE MEDICAL CARE IF:   Your pain is not controlled with prescribed medicine.  You have severe swelling around the wound causing pain and numbness or a change in color in your arm, hand, leg, or foot.  Your wound splits open and starts bleeding.  You have worsening numbness, weakness, or loss of function of any joint around or beyond the wound.  You develop painful lumps near the wound or  on the skin anywhere on your body. MAKE SURE YOU:   Understand these instructions.  Will watch your condition.  Will get help right away if you are not doing well or get worse. Document Released: 05/30/2005 Document Revised: 08/22/2011 Document Reviewed: 11/23/2010 [facility] Patient Information 2014 [place], [place].

## 2013-09-02 NOTE — ED Notes (Signed)
Pt calm,less anxious and reports pain improving. Pt alert and no longer diaphoretic.

## 2013-09-02 NOTE — ED Provider Notes (Signed)
CSN: [id number]     Arrival date & time 09/02/13  1642 History  This chart was scribed for Janice Norrie, MD by Maree Erie, ED Scribe. The patient was seen in room APA17/APA17. Patient's care was started at 4:55 PM.     Chief Complaint  Patient presents with  . Extremity Laceration     The history is provided by the patient. No language interpreter was used.    HPI Comments: Erin Vazquez is a 73 y.o. female who presents to the Emergency Department complaining of multiple left finger injuries that occurred just prior to arrival. She states that she was trimming her hedges with electric hedge trimmers when she lifted up the hedge trimmer and accidentally hit her index and middle left finger. She states she got diaphoretic immediately.  The bleeding is currently controlled. She is complaining of severe, constant pain to the lacerated fingers onset immediately after the injury was sustained. The patient is right hand dominant. She is unsure if she is up to date on her Tetanus vaccination. She is taking medication for HTN and hypothyroidism. She denies smoking or alcohol use.   PCP Dr French Ana  Past Medical History  Diagnosis Date  . Hypothyroid   . Arthritis   . Fibrocystic breast   . Ovarian cyst   . Genital herpes   . HTN (hypertension)   . Seasonal allergies   . Eczema    Past Surgical History  Procedure Laterality Date  . Tubal ligation    . Mastectomy, partial  left  . Breast reconstruction    . Cervix removal    . Shoulder surgery  lipoma excision  . Knee arthroscopy  left 2010 Dr. Aline Brochure   Family History  Problem Relation Age of Onset  . Cancer    . Heart disease    . Crohn's disease    . Diabetes    . Multiple sclerosis    . Hernia    . Arthritis     History  Substance Use Topics  . Smoking status: Former Research scientist (life sciences)  . Smokeless tobacco: Not on file  . Alcohol Use: No   employed  OB History   Grav Para Term Preterm Abortions TAB SAB Ect Mult Living                  Review of Systems  Constitutional: Negative for fever and chills.  Skin: Positive for wound.  All other systems reviewed and are negative.      Allergies  Review of patient's allergies indicates no known allergies.  Home Medications   Current Outpatient Rx  Name  Route  Sig  Dispense  Refill  . ibuprofen (ADVIL,MOTRIN) 200 MG tablet   Oral   Take 800 mg by mouth daily as needed for moderate pain or cramping.         Marland Kitchen levothyroxine (SYNTHROID, LEVOTHROID) 75 MCG tablet   Oral   Take 75 mcg by mouth daily.           Marland Kitchen losartan (COZAAR) 25 MG tablet   Oral   Take 25 mg by mouth daily.           . cephALEXin (KEFLEX) 500 MG capsule   Oral   Take 1 capsule (500 mg total) by mouth 3 (three) times daily.   21 capsule   0   . naproxen (NAPROSYN) 375 MG tablet      Take 1 po BID with a meal PRN pain  20 tablet   0    Triage Vitals: BP 210/97  Pulse 97  Resp 22  Ht 5\' 6"  (1.676 m)  Wt 225 lb (102.059 kg)  BMI 36.33 kg/m2  SpO2 100%  Vital signs normal except for hypertension which improved after her pain was controlled with the digital block   Physical Exam  Nursing note and vitals reviewed. Constitutional: She is oriented to person, place, and time. She appears well-developed and well-nourished.  Non-toxic appearance. She does not appear ill. No distress.  HENT:  Head: Normocephalic and atraumatic.  Right Ear: External ear normal.  Left Ear: External ear normal.  Nose: Nose normal. No mucosal edema or rhinorrhea.  Mouth/Throat: Oropharynx is clear and moist and mucous membranes are normal. No dental abscesses or uvula swelling.  Eyes: Conjunctivae and EOM are normal. Pupils are equal, round, and reactive to light.  Neck: Normal range of motion and full passive range of motion without pain. Neck supple.  Cardiovascular: Normal rate, regular rhythm and normal heart sounds.  Exam reveals no gallop and no friction rub.   No murmur  heard. Pulmonary/Chest: Effort normal and breath sounds normal. No respiratory distress. She has no wheezes. She has no rhonchi. She has no rales. She exhibits no tenderness and no crepitus.  Abdominal: Soft. Normal appearance and bowel sounds are normal. She exhibits no distension. There is no tenderness. There is no rebound and no guarding.  Musculoskeletal: Normal range of motion. She exhibits no edema and no tenderness.  Moves all extremities well.  Pt has intact flex/ext of her distal fingers.   Neurological: She is alert and oriented to person, place, and time. She has normal strength. No cranial nerve deficit.  Skin: Skin is warm, dry and intact. No rash noted. No erythema. No pallor.  Left middle finger has stellate laceration on volar aspect at the crease of DIP joint radially. Left index finger has flap laceration on volar aspect of distal phalanx.  See attached photos below of left finger lacerations.   Psychiatric: She has a normal mood and affect. Her speech is normal and behavior is normal. Her mood appears not anxious.           ED Course  Procedures (including critical care time) Medications  oxyCODONE-acetaminophen (PERCOCET/ROXICET) 5-325 MG per tablet 1 tablet (1 tablet Oral Not Given 09/02/13 1905)  bupivacaine (MARCAINE) 0.5 % injection 30 mL (30 mLs Infiltration Given by Other 09/02/13 1707)  Tdap (BOOSTRIX) injection 0.5 mL (0.5 mLs Intramuscular Given 09/02/13 1907)     DIAGNOSTIC STUDIES: Oxygen Saturation is 100% on [place], normal by my interpretation.    COORDINATION OF CARE: 5:02 PM -Will perform digital block of the second and third metatarsals on her left hand.Patient verbalizes understanding and agrees with treatment plan.  5:03 PM- Performed digital block successfully of the second and third left metatarsals using 13 cc of 0.5% bupivacaine. Patient tolerated procedure well. Her pain is improving. PT is more calm now.  Will send to radiology for imaging  prior to repairing lacerations.  6:11 PM -Updated patient on radiology results. She denies any pain post-digital block. Will perform laceration repair. Patient verbalizes understanding and agrees with treatment plan.  LACERATION REPAIR PROCEDURE NOTE The patient's identification was confirmed and consent was obtained. This procedure was performed by Janice Norrie, MD at 6:17 PM. Site: volar aspect of left middle finger  Sterile procedures observed Anesthetic used (type and amt): digital block described above Suture type/size: 4-0 Ethilon  Length:  0.5 cm # of Sutures: 2 Technique: simple, interrupted Antibx ointment applied Tetanus ordered Site anesthetized cleaned with sterile saline/betadine mix, explored without evidence of foreign body, wound well approximated, site covered with dry, sterile dressing.  Patient tolerated procedure well without complications. Instructions for care discussed verbally and patient provided with additional written instructions for homecare and f/u.  LACERATION REPAIR PROCEDURE NOTE The patient's identification was confirmed and consent was obtained. This procedure was performed by Janice Norrie, MD at 6:23 PM. Site: volar aspect of left index finger Sterile procedures observed Anesthetic used (type and amt): digital block described above Suture type/size:4-0 Ethilon Length: 2 cm # of Sutures: 5 Technique:simple, interrupted Antibx ointment applied Tetanus ordered Site anesthetized, cleaned with sterile saline/betadine mix, explored without evidence of foreign body, wound well approximated, site covered with dry, sterile dressing.  Patient tolerated procedure well without complications. Instructions for care discussed verbally and patient provided with additional written instructions for homecare and f/u.    Imaging Review Dg Hand Complete Left  09/02/2013   CLINICAL DATA:  Laceration of the index and middle fingers while trimming hedges.  EXAM:  LEFT HAND - COMPLETE 3+ VIEW  COMPARISON:  None.  FINDINGS: Laceration with flap of tissue noted distally in the index finger, without underlying foreign body or acute bony finding.  Smaller laceration radially in the midportion of the middle finger. Likewise, no acute bony findings or foreign body.  Degenerative findings at the first carpometacarpal joint. Atherosclerosis noted.  IMPRESSION: 1. Lacerations of the second and third fingers, without underlying bony abnormality or foreign body. 2. 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   Electronically Signed   By: Sherryl Barters M.D.   On: 09/02/2013 17:48     EKG Interpretation None      MDM  patient presents with stellate type lacerations on the volar aspect of her left index and middle finger. There was no obvious deep breathing in the wound however she was using a Market researcher. She has no evidence for bony injury. She has good range of motion. Her wounds were sutured and she was placed on prophylactic antibiotics.    Final diagnoses:  Laceration of finger, index  Laceration of middle finger   New Prescriptions   CEPHALEXIN (KEFLEX) 500 MG CAPSULE    Take 1 capsule (500 mg total) by mouth 3 (three) times daily.   HYDROCODONE-ACETAMINOPHEN (NORCO/VICODIN) 5-325 MG PER TABLET    Take 1 or 2 po Q 6hrs for pain   NAPROXEN (NAPROSYN) 375 MG TABLET    Take 1 po BID with a meal PRN pain    Plan discharge  Rolland Porter, MD, FACEP    I personally performed the services described in this documentation, which was scribed in my presence. The recorded information has been reviewed and considered.  Rolland Porter, MD, FACEP    Janice Norrie, MD 09/02/13 [phone]

## 2013-09-02 NOTE — ED Notes (Addendum)
Pt reports using an Chief of Staff when she cut two of her fingers on her left hand. Bleeding controlled. Pt alert and oriented but diaphoretic. Pt anxious and tearful in triage.

## 2013-09-02 NOTE — ED Notes (Signed)
Pt suture sites dressed with telfa and wrapped with conform dressing per verbal order of EDP. Pt tolerated well. CNS intact post dressing.

## 2013-09-02 NOTE — ED Notes (Signed)
EDP aware of pt pain level and v/s. EDP give verbal order to pull Bupivacaine for digital block. Med at bedside.

## 2013-09-13 ENCOUNTER — Encounter (HOSPITAL_COMMUNITY): Payer: Self-pay | Admitting: Emergency Medicine

## 2013-09-13 ENCOUNTER — Emergency Department (HOSPITAL_COMMUNITY)
Admission: EM | Admit: 2013-09-13 | Discharge: 2013-09-13 | Disposition: A | Discharge status: Home / Self Care | Payer: Medicare HMO | Attending: Emergency Medicine | Admitting: Emergency Medicine

## 2013-09-13 DIAGNOSIS — Z792 Long term (current) use of antibiotics: Secondary | ICD-10-CM | POA: Insufficient documentation

## 2013-09-13 DIAGNOSIS — Z4802 Encounter for removal of sutures: Secondary | ICD-10-CM

## 2013-09-13 DIAGNOSIS — Z87891 Personal history of nicotine dependence: Secondary | ICD-10-CM | POA: Insufficient documentation

## 2013-09-13 DIAGNOSIS — Z4801 Encounter for change or removal of surgical wound dressing: Secondary | ICD-10-CM | POA: Insufficient documentation

## 2013-09-13 DIAGNOSIS — Z79899 Other long term (current) drug therapy: Secondary | ICD-10-CM | POA: Insufficient documentation

## 2013-09-13 DIAGNOSIS — E039 Hypothyroidism, unspecified: Secondary | ICD-10-CM | POA: Insufficient documentation

## 2013-09-13 DIAGNOSIS — Z8739 Personal history of other diseases of the musculoskeletal system and connective tissue: Secondary | ICD-10-CM | POA: Insufficient documentation

## 2013-09-13 DIAGNOSIS — Z872 Personal history of diseases of the skin and subcutaneous tissue: Secondary | ICD-10-CM | POA: Insufficient documentation

## 2013-09-13 DIAGNOSIS — I10 Essential (primary) hypertension: Secondary | ICD-10-CM | POA: Insufficient documentation

## 2013-09-13 DIAGNOSIS — Z8619 Personal history of other infectious and parasitic diseases: Secondary | ICD-10-CM | POA: Insufficient documentation

## 2013-09-13 DIAGNOSIS — Z87448 Personal history of other diseases of urinary system: Secondary | ICD-10-CM | POA: Insufficient documentation

## 2013-09-13 MED ORDER — CEPHALEXIN 500 MG PO CAPS: 500.0000 mg | ORAL_CAPSULE | Freq: Three times a day (TID) | ORAL | Status: DC

## 2013-09-13 NOTE — Discharge Instructions (Signed)
Suture Removal, Care After Refer to this sheet in the next few weeks. These instructions provide you with information on caring for yourself after your procedure. Your health care provider may also give you more specific instructions. Your treatment has been planned according to current medical practices, but problems sometimes occur. Call your health care provider if you have any problems or questions after your procedure. WHAT TO EXPECT AFTER THE PROCEDURE After your stitches (sutures) are removed, it is typical to have the following:  Some discomfort and swelling in the wound area.  Slight redness in the area. HOME CARE INSTRUCTIONS   If you have skin adhesive strips over the wound area, do not take the strips off. They will fall off on their own in a few days. If the strips remain in place after 14 days, you may remove them.  Change any bandages (dressings) at least once a day or as directed by your health care provider. If the bandage sticks, soak it off with warm, soapy water.  Apply cream or ointment only as directed by your health care provider. If using cream or ointment, wash the area with soap and water 2 times a day to remove all the cream or ointment. Rinse off the soap and pat the area dry with a clean towel.  Keep the wound area dry and clean. If the bandage becomes wet or dirty, or if it develops a bad smell, change it as soon as possible.  Continue to protect the wound from injury.  Use sunscreen when out in the sun. New scars become sunburned easily. SEEK MEDICAL CARE IF:  You have increasing redness, swelling, or pain in the wound.  You see pus coming from the wound.  You have a fever.  You notice a bad smell coming from the wound or dressing.  Your wound breaks open (edges not staying together). Document Released: 02/22/2001 Document Revised: 03/20/2013 Document Reviewed: 01/09/2013 [employer] Patient Information 2014 [employer].  

## 2013-09-13 NOTE — ED Notes (Signed)
Pt has stitches in middle and index finger of lt hand, states she thinks the wound is infected, red, and swollen. Pt got the stitches 2 weeks ago.

## 2013-09-13 NOTE — ED Notes (Signed)
Ready for d/c when seen by me 

## 2013-09-15 NOTE — ED Provider Notes (Signed)
Medical screening examination/treatment/procedure(s) were performed by non-physician practitioner and as supervising physician I was immediately available for consultation/collaboration.  Leota Jacobsen, MD 09/15/13 [phone]

## 2013-09-15 NOTE — ED Provider Notes (Addendum)
CSN: [id number]     Arrival date & time 09/13/13  1713 History   First MD Initiated Contact with Patient 09/13/13 1806     Chief Complaint  Patient presents with  . Finger Injury     (Consider location/radiation/quality/duration/timing/severity/associated sxs/prior Treatment) HPI Comments: Erin Vazquez is a 73 y.o. female who presents to the Emergency Department requesting suture removal.  She reports laceration to her middle and index fingers that occurred two week ago and states she is "past due" to have sutures removed.  She also c/o redness and swelling to the affected fingers surrounding the wounds.  She denies fever, chills, drainage, difficulty moving the fingers, numbness or weakness of the fingers.    The history is provided by the patient.    Past Medical History  Diagnosis Date  . Hypothyroid   . Arthritis   . Fibrocystic breast   . Ovarian cyst   . Genital herpes   . HTN (hypertension)   . Seasonal allergies   . Eczema    Past Surgical History  Procedure Laterality Date  . Tubal ligation    . Mastectomy, partial  left  . Breast reconstruction    . Cervix removal    . Shoulder surgery  lipoma excision  . Knee arthroscopy  left 2010 Dr. Aline Brochure   Family History  Problem Relation Age of Onset  . Cancer    . Heart disease    . Crohn's disease    . Diabetes    . Multiple sclerosis    . Hernia    . Arthritis     History  Substance Use Topics  . Smoking status: Former Research scientist (life sciences)  . Smokeless tobacco: Not on file  . Alcohol Use: No   OB History   Grav Para Term Preterm Abortions TAB SAB Ect Mult Living                 Review of Systems  Constitutional: Negative for fever and chills.  Musculoskeletal: Negative for arthralgias, back pain and joint swelling.  Skin: Positive for wound.       Laceration left index and middle fingers  Neurological: Negative for dizziness, weakness and numbness.  Hematological: Does not bruise/bleed easily.  All other systems  reviewed and are negative.     Allergies  Review of patient's allergies indicates no known allergies.  Home Medications   Current Outpatient Rx  Name  Route  Sig  Dispense  Refill  . ibuprofen (ADVIL,MOTRIN) 200 MG tablet   Oral   Take 800 mg by mouth daily as needed for moderate pain or cramping.         Marland Kitchen levothyroxine (SYNTHROID, LEVOTHROID) 75 MCG tablet   Oral   Take 75 mcg by mouth daily.           Marland Kitchen losartan-hydrochlorothiazide (HYZAAR) 50-12.5 MG per tablet   Oral   Take 1 tablet by mouth daily.         . naproxen (NAPROSYN) 375 MG tablet   Oral   Take 375 mg by mouth 2 (two) times daily as needed for mild pain or moderate pain. Take 1 po BID with a meal PRN pain         . cephALEXin (KEFLEX) 500 MG capsule   Oral   Take 1 capsule (500 mg total) by mouth 3 (three) times daily.   21 capsule   0   . cephALEXin (KEFLEX) 500 MG capsule   Oral   Take 1 capsule (  500 mg total) by mouth 3 (three) times daily. For 7 days   21 capsule   0   . HYDROcodone-acetaminophen (NORCO/VICODIN) 5-325 MG per tablet   Oral   Take 1-2 tablets by mouth every 6 (six) hours as needed for moderate pain or severe pain. Take 1 or 2 po Q 6hrs for pain          BP 152/70  Pulse 64  Temp(Src) 97.8 F (36.6 C) (Oral)  Resp 18  Ht 5\' 6"  (1.676 m)  Wt 224 lb (101.606 kg)  BMI 36.17 kg/m2  SpO2 100% Physical Exam  Nursing note and vitals reviewed. Constitutional: She is oriented to person, place, and time. She appears well-developed and well-nourished. No distress.  HENT:  Head: Normocephalic and atraumatic.  Cardiovascular: Normal rate, regular rhythm, normal heart sounds and intact distal pulses.   No murmur heard. Pulmonary/Chest: Effort normal and breath sounds normal. No respiratory distress.  Musculoskeletal: Normal range of motion. She exhibits no edema and no tenderness.  Pt has full ROM of the fingers of the left hand.  Radial pulse brisk.  Distal sensation intact   Neurological: She is alert and oriented to person, place, and time. She exhibits normal muscle tone. Coordination normal.  Skin: Skin is warm. Laceration noted.  Laceration to the distal left index and middle fingers with sutures present.  Mild erythema surrounding the lac of the index finger.   Lacerations appears to be healing well. No  red streaks    ED Course  Procedures (including critical care time) Labs Review Labs Reviewed - No data to display Imaging Review No results found.   EKG Interpretation None       Sutures to the left middle and index fingers were removed by me without difficultly.  Wounds appear to be healing well.  Mild serous drainage present without clinical signs of infection.     MDM   Final diagnoses:  Visit for suture removal     Pt has slowly healing lacerations to the left middle and index fingers.  Remains NV intact.  Pt has full ROM of the fingers. Agrees to keep bandaged and f/u with her PMD for f/u.    Oneisha Ammons L. Morrisa Aldaba, PA-C 09/15/13 1224  Garvin Ellena L. Vanessa [place], PA-C 09/26/13 1528

## 2013-09-27 NOTE — ED Provider Notes (Signed)
Medical screening examination/treatment/procedure(s) were performed by non-physician practitioner and as supervising physician I was immediately available for consultation/collaboration.   EKG Interpretation None       Yannis Broce T Roseanne Juenger, MD 09/27/13 1820 

## 2013-10-30 ENCOUNTER — Emergency Department (HOSPITAL_COMMUNITY): Payer: Medicare HMO

## 2013-10-30 ENCOUNTER — Emergency Department (HOSPITAL_COMMUNITY)
Admission: EM | Admit: 2013-10-30 | Discharge: 2013-10-31 | Disposition: A | Discharge status: Home / Self Care | Payer: Medicare HMO | Attending: Emergency Medicine | Admitting: Emergency Medicine

## 2013-10-30 ENCOUNTER — Encounter (HOSPITAL_COMMUNITY): Payer: Self-pay | Admitting: Emergency Medicine

## 2013-10-30 DIAGNOSIS — M791 Myalgia, unspecified site: Secondary | ICD-10-CM

## 2013-10-30 DIAGNOSIS — Z8742 Personal history of other diseases of the female genital tract: Secondary | ICD-10-CM | POA: Insufficient documentation

## 2013-10-30 DIAGNOSIS — R509 Fever, unspecified: Secondary | ICD-10-CM | POA: Insufficient documentation

## 2013-10-30 DIAGNOSIS — H5789 Other specified disorders of eye and adnexa: Secondary | ICD-10-CM | POA: Insufficient documentation

## 2013-10-30 DIAGNOSIS — E860 Dehydration: Secondary | ICD-10-CM | POA: Insufficient documentation

## 2013-10-30 DIAGNOSIS — R5381 Other malaise: Secondary | ICD-10-CM | POA: Insufficient documentation

## 2013-10-30 DIAGNOSIS — Z87891 Personal history of nicotine dependence: Secondary | ICD-10-CM | POA: Insufficient documentation

## 2013-10-30 DIAGNOSIS — N390 Urinary tract infection, site not specified: Secondary | ICD-10-CM | POA: Insufficient documentation

## 2013-10-30 DIAGNOSIS — Z8619 Personal history of other infectious and parasitic diseases: Secondary | ICD-10-CM | POA: Insufficient documentation

## 2013-10-30 DIAGNOSIS — R5383 Other fatigue: Secondary | ICD-10-CM

## 2013-10-30 DIAGNOSIS — IMO0001 Reserved for inherently not codable concepts without codable children: Secondary | ICD-10-CM | POA: Insufficient documentation

## 2013-10-30 DIAGNOSIS — Z8709 Personal history of other diseases of the respiratory system: Secondary | ICD-10-CM | POA: Insufficient documentation

## 2013-10-30 DIAGNOSIS — Z8739 Personal history of other diseases of the musculoskeletal system and connective tissue: Secondary | ICD-10-CM | POA: Insufficient documentation

## 2013-10-30 DIAGNOSIS — J329 Chronic sinusitis, unspecified: Secondary | ICD-10-CM | POA: Insufficient documentation

## 2013-10-30 DIAGNOSIS — I10 Essential (primary) hypertension: Secondary | ICD-10-CM | POA: Insufficient documentation

## 2013-10-30 DIAGNOSIS — Z792 Long term (current) use of antibiotics: Secondary | ICD-10-CM | POA: Insufficient documentation

## 2013-10-30 DIAGNOSIS — J3489 Other specified disorders of nose and nasal sinuses: Secondary | ICD-10-CM | POA: Insufficient documentation

## 2013-10-30 DIAGNOSIS — E039 Hypothyroidism, unspecified: Secondary | ICD-10-CM | POA: Insufficient documentation

## 2013-10-30 DIAGNOSIS — M542 Cervicalgia: Secondary | ICD-10-CM | POA: Insufficient documentation

## 2013-10-30 DIAGNOSIS — Z872 Personal history of diseases of the skin and subcutaneous tissue: Secondary | ICD-10-CM | POA: Insufficient documentation

## 2013-10-30 LAB — COMPREHENSIVE METABOLIC PANEL
ALBUMIN: 3.5 g/dL (ref 3.5–5.2)
ALK PHOS: 91 U/L (ref 39–117)
ALT: 16 U/L (ref 0–35)
AST: 18 U/L (ref 0–37)
BUN: 21 mg/dL (ref 6–23)
CO2: 28 mEq/L (ref 19–32)
Calcium: 8.9 mg/dL (ref 8.4–10.5)
Chloride: 94 mEq/L — ABNORMAL LOW (ref 96–112)
Creatinine, Ser: 1.64 mg/dL — ABNORMAL HIGH (ref 0.50–1.10)
GFR calc Af Amer: 35 mL/min — ABNORMAL LOW (ref >90)
GFR calc non Af Amer: 30 mL/min — ABNORMAL LOW (ref >90)
Glucose, Bld: 118 mg/dL — ABNORMAL HIGH (ref 70–99)
POTASSIUM: 3.4 meq/L — AB (ref 3.7–5.3)
Sodium: 135 mEq/L — ABNORMAL LOW (ref 137–147)
Total Bilirubin: 1.2 mg/dL (ref 0.3–1.2)
Total Protein: 7.8 g/dL (ref 6.0–8.3)

## 2013-10-30 LAB — CBC WITH DIFFERENTIAL/PLATELET
BASOS ABS: 0 10*3/uL (ref 0.0–0.1)
Basophils Relative: 0 % (ref 0–1)
Eosinophils Absolute: 0.1 10*3/uL (ref 0.0–0.7)
Eosinophils Relative: 1 % (ref 0–5)
HCT: 37.3 % (ref 36.0–46.0)
Hemoglobin: 11.9 g/dL — ABNORMAL LOW (ref 12.0–15.0)
LYMPHS ABS: 1.1 10*3/uL (ref 0.7–4.0)
LYMPHS PCT: 9 % — AB (ref 12–46)
MCH: 22.4 pg — ABNORMAL LOW (ref 26.0–34.0)
MCHC: 31.9 g/dL (ref 30.0–36.0)
MCV: 70.2 fL — AB (ref 78.0–100.0)
MONOS PCT: 12 % (ref 3–12)
Monocytes Absolute: 1.4 10*3/uL — ABNORMAL HIGH (ref 0.1–1.0)
NEUTROS PCT: 78 % — AB (ref 43–77)
Neutro Abs: 9.1 10*3/uL — ABNORMAL HIGH (ref 1.7–7.7)
PLATELETS: 239 10*3/uL (ref 150–400)
RBC: 5.31 MIL/uL — ABNORMAL HIGH (ref 3.87–5.11)
RDW: 14.7 % (ref 11.5–15.5)
WBC: 11.7 10*3/uL — AB (ref 4.0–10.5)

## 2013-10-30 LAB — URINALYSIS, ROUTINE W REFLEX MICROSCOPIC
Bilirubin Urine: NEGATIVE
GLUCOSE, UA: NEGATIVE mg/dL
KETONES UR: NEGATIVE mg/dL
Nitrite: NEGATIVE
Protein, ur: 100 mg/dL — AB
Specific Gravity, Urine: 1.025 (ref 1.005–1.030)
Urobilinogen, UA: 1 mg/dL (ref 0.0–1.0)
pH: 6 (ref 5.0–8.0)

## 2013-10-30 LAB — URINE MICROSCOPIC-ADD ON

## 2013-10-30 MED ORDER — SODIUM CHLORIDE 0.9 % IV BOLUS (SEPSIS)
1000.0000 mL | Freq: Once | INTRAVENOUS | Status: AC
  Administered 2013-10-30: 1000 mL via INTRAVENOUS

## 2013-10-30 MED ORDER — KETOROLAC TROMETHAMINE 30 MG/ML IJ SOLN
30.0000 mg | Freq: Once | INTRAMUSCULAR | Status: AC
  Administered 2013-10-30: 30 mg via INTRAVENOUS
  Filled 2013-10-30: qty 1

## 2013-10-30 MED ORDER — DEXTROSE 5 % IV SOLN
1.0000 g | Freq: Once | INTRAVENOUS | Status: AC
  Administered 2013-10-30: 1 g via INTRAVENOUS
  Filled 2013-10-30: qty 10

## 2013-10-30 NOTE — ED Notes (Signed)
Patient c/o productive cough with thick white sputum x1 week, runny nose, and watery eyes x2 weeks. Patient reports taking claritin. Patient then states she started having burning with urination and cloudy urine on Sunday-started taking cranberry cystex and cystex plus. Patient reports burning sensation gone. Patient states "Now I just feel weak all over I have pain in my l started running a fever today 100.3 and my neck hurts."

## 2013-10-30 NOTE — ED Provider Notes (Signed)
CSN: [id number]     Arrival date & time 10/30/13  1753 History  This chart was scribed for Julianne Rice, MD by Rolanda Lundborg, ED Scribe. This patient was seen in room APA08/[facility]08 and the patient's care was started at 11:05 PM.    Chief Complaint  Patient presents with  . Fatigue  . Cough  . Fever   Patient is a 73 y.o. female presenting with cough. The history is provided by the patient. No language interpreter was used.  Cough Cough characteristics:  Productive Sputum characteristics:  White Severity:  Moderate Onset quality:  Gradual Duration:  1 week Timing:  Constant Associated symptoms: eye discharge (watery), fever, myalgias and rhinorrhea   Associated symptoms: no chest pain, no chills, no ear pain, no rash, no shortness of breath, no sore throat and no wheezing    HPI Comments: CHARDE MACFARLANE is a 73 y.o. female who presents to the Emergency Department complaining of a persistent productive cough with white sputum onset one week ago with associated fever, congestion, and generalized weakness. She states the fever started today with a Tmax of 100.3. She also reports seasonal allergies with watery eyes and rhinorrhea for the past 2 weeks. She has been taking Claritin.   She also reports bilateral lower back pain, dysuria, and cloudy urine that started 3 days ago. She started taking cranberry cystex and cystex plus with no relief. She has taken 3 doses of cephalexin 500mg  since yesterday that she had leftover.   She also reports non-radiating dull left thigh pain described as cramping. She also reports posterior neck pain.    Past Medical History  Diagnosis Date  . Hypothyroid   . Arthritis   . Fibrocystic breast   . Ovarian cyst   . Genital herpes   . HTN (hypertension)   . Seasonal allergies   . Eczema    Past Surgical History  Procedure Laterality Date  . Tubal ligation    . Mastectomy, partial  left  . Breast reconstruction    . Cervix removal    . Shoulder  surgery  lipoma excision  . Knee arthroscopy  left 2010 Dr. Aline Brochure   Family History  Problem Relation Age of Onset  . Cancer    . Heart disease    . Crohn's disease    . Diabetes    . Multiple sclerosis    . Hernia    . Arthritis     History  Substance Use Topics  . Smoking status: Former Smoker -- 1.00 packs/day for 4 years    Types: Cigarettes    Quit date: 06/14/1963  . Smokeless tobacco: Never Used  . Alcohol Use: No   OB History   Grav Para Term Preterm Abortions TAB SAB Ect Mult Living   6 6 4 2      6      Review of Systems  Constitutional: Positive for fever and fatigue. Negative for chills.  HENT: Positive for congestion, rhinorrhea and sinus pressure. Negative for ear discharge, ear pain and sore throat.   Eyes: Positive for discharge (watery).  Respiratory: Positive for cough. Negative for shortness of breath and wheezing.   Cardiovascular: Negative for chest pain, palpitations and leg swelling.  Gastrointestinal: Negative for nausea, vomiting, abdominal pain and diarrhea.  Genitourinary: Positive for dysuria. Negative for flank pain and pelvic pain.  Musculoskeletal: Positive for back pain, myalgias and neck pain. Negative for gait problem, joint swelling and neck stiffness.  Skin: Negative for rash and wound.  Neurological: Positive for weakness (generalized). Negative for dizziness and light-headedness.  All other systems reviewed and are negative.  A complete 10 system review of systems was obtained and all systems are negative except as noted in the HPI and PMH.     Allergies  Review of patient's allergies indicates no known allergies.  Home Medications   Prior to Admission medications   Medication Sig Start Date End Date Taking? Authorizing Provider  cephALEXin (KEFLEX) 500 MG capsule Take 1 capsule (500 mg total) by mouth 3 (three) times daily. For 7 days 09/13/13  Yes Tammy L. Triplett, PA-C  levothyroxine (SYNTHROID, LEVOTHROID) 75 MCG tablet Take  75 mcg by mouth daily.     Yes Historical Provider, MD  losartan-hydrochlorothiazide (HYZAAR) 50-12.5 MG per tablet Take 1 tablet by mouth daily. 08/18/13  Yes Historical Provider, MD   BP 130/67  Pulse 78  Temp(Src) 99.2 F (37.3 C) (Oral)  Resp 18  Ht 5\' 6"  (1.676 m)  Wt 225 lb (102.059 kg)  BMI 36.33 kg/m2  SpO2 100% Physical Exam  Nursing note and vitals reviewed. Constitutional: She is oriented to person, place, and time. She appears well-developed and well-nourished. No distress.  HENT:  Head: Normocephalic and atraumatic.  Mouth/Throat: Oropharynx is clear and moist. No oropharyngeal exudate.  Bilateral nasal mucosal edema. Left maxillary tenderness to percussion.  Eyes: EOM are normal. Pupils are equal, round, and reactive to light.  Neck: Normal range of motion. Neck supple.  No meningismus. No midline cervical tenderness. Mild bilateral cervical paraspinal muscle tenderness to palpation. Patient has full range of motion of her neck.  Cardiovascular: Normal rate and regular rhythm.   Pulmonary/Chest: Effort normal and breath sounds normal. No respiratory distress. She has no wheezes. She has no rales. She exhibits no tenderness.  Abdominal: Soft. Bowel sounds are normal. She exhibits no distension and no mass. There is tenderness (mild suprapubic tenderness to palpation. No rebound or guarding.). There is no rebound and no guarding.  Musculoskeletal: Normal range of motion. She exhibits no edema and no tenderness.  No CVA tenderness. Patient has bilateral lower lumbar paraspinal muscle tenderness to palpation. No midline thoracic or lumbar tenderness.  Neurological: She is alert and oriented to person, place, and time.  5/5 motor in all extremities. Sensation is intact.  Skin: Skin is warm and dry. No rash noted. No erythema.  Psychiatric: She has a normal mood and affect. Her behavior is normal.    ED Course  Procedures (including critical care time) Medications - No data  to display  DIAGNOSTIC STUDIES: Oxygen Saturation is 100% on RA, normal by my interpretation.    COORDINATION OF CARE: 11:17 PM- Discussed treatment plan with pt. Pt agrees to plan.    Labs Review Labs Reviewed  CBC WITH DIFFERENTIAL - Abnormal; Notable for the following:    WBC 11.7 (*)    RBC 5.31 (*)    Hemoglobin 11.9 (*)    MCV 70.2 (*)    MCH 22.4 (*)    Neutrophils Relative % 78 (*)    Lymphocytes Relative 9 (*)    Neutro Abs 9.1 (*)    Monocytes Absolute 1.4 (*)    All other components within normal limits  COMPREHENSIVE METABOLIC PANEL - Abnormal; Notable for the following:    Sodium 135 (*)    Potassium 3.4 (*)    Chloride 94 (*)    Glucose, Bld 118 (*)    Creatinine, Ser 1.64 (*)    GFR calc non  Af Amer 30 (*)    GFR calc Af Amer 35 (*)    All other components within normal limits  URINALYSIS, ROUTINE W REFLEX MICROSCOPIC - Abnormal; Notable for the following:    Color, Urine ORANGE (*)    APPearance CLOUDY (*)    Hgb urine dipstick MODERATE (*)    Protein, ur 100 (*)    Leukocytes, UA LARGE (*)    All other components within normal limits  URINE MICROSCOPIC-ADD ON - Abnormal; Notable for the following:    Squamous Epithelial / LPF FEW (*)    Bacteria, UA MANY (*)    All other components within normal limits    Imaging Review Dg Chest 2 View  10/30/2013   CLINICAL DATA:  Productive cough.  EXAM: CHEST  2 VIEW  COMPARISON:  None.  FINDINGS: There is cardiomegaly without edema. The lungs are clear. No pneumothorax or pleural effusion.  IMPRESSION: Cardiomegaly without acute disease.   Electronically Signed   By: Inge Rise M.D.   On: 10/30/2013 19:18     EKG Interpretation None      MDM   Final diagnoses:  None    I personally performed the services described in this documentation, which was scribed in my presence. The recorded information has been reviewed and is accurate. Patient says she's feeling much better after the IV fluids. She's  been given a dose of IV Rocephin in the emergency department. She's been informed that she needs to followup with her primary Dr. Return precautions have been given and she is voiced understanding.   Julianne Rice, MD 10/31/13 [phone]

## 2013-10-31 MED ORDER — IBUPROFEN 400 MG PO TABS: 400.0000 mg | ORAL_TABLET | Freq: Four times a day (QID) | ORAL | Status: DC | PRN

## 2013-10-31 MED ORDER — SULFAMETHOXAZOLE-TMP DS 800-160 MG PO TABS: 1.0000 | ORAL_TABLET | Freq: Two times a day (BID) | ORAL | Status: DC

## 2013-10-31 MED ORDER — OXYMETAZOLINE HCL 0.05 % NA SOLN: 1.0000 | Freq: Two times a day (BID) | NASAL | Status: DC

## 2013-10-31 MED ORDER — MOMETASONE FUROATE 50 MCG/ACT NA SUSP: 2.0000 | Freq: Every day | NASAL | Status: DC

## 2013-10-31 NOTE — Discharge Instructions (Signed)
Dehydration, Adult °Dehydration is when you lose more fluids from the body than you take in. Vital organs like the kidneys, brain, and heart cannot function without a proper amount of fluids and salt. Any loss of fluids from the body can cause dehydration.  °CAUSES  °· Vomiting. °· Diarrhea. °· Excessive sweating. °· Excessive urine output. °· Fever. °SYMPTOMS  °Mild dehydration °· Thirst. °· Dry lips. °· Slightly dry mouth. °Moderate dehydration °· Very dry mouth. °· Sunken eyes. °· Skin does not bounce back quickly when lightly pinched and released. °· Dark urine and decreased urine production. °· Decreased tear production. °· Headache. °Severe dehydration °· Very dry mouth. °· Extreme thirst. °· Rapid, weak pulse (more than 100 beats per minute at rest). °· Cold hands and feet. °· Not able to sweat in spite of heat and temperature. °· Rapid breathing. °· Blue lips. °· Confusion and lethargy. °· Difficulty being awakened. °· Minimal urine production. °· No tears. °DIAGNOSIS  °Your caregiver will diagnose dehydration based on your symptoms and your exam. Blood and urine tests will help confirm the diagnosis. The diagnostic evaluation should also identify the cause of dehydration. °TREATMENT  °Treatment of mild or moderate dehydration can often be done at home by increasing the amount of fluids that you drink. It is best to drink small amounts of fluid more often. Drinking too much at one time can make vomiting worse. Refer to the home care instructions below. °Severe dehydration needs to be treated at the hospital where you will probably be given intravenous (IV) fluids that contain water and electrolytes. °HOME CARE INSTRUCTIONS  °· Ask your caregiver about specific rehydration instructions. °· Drink enough fluids to keep your urine clear or pale yellow. °· Drink small amounts frequently if you have nausea and vomiting. °· Eat as you normally do. °· Avoid: °· Foods or drinks high in sugar. °· Carbonated  drinks. °· Juice. °· Extremely hot or cold fluids. °· Drinks with caffeine. °· Fatty, greasy foods. °· Alcohol. °· Tobacco. °· Overeating. °· Gelatin desserts. °· Wash your hands well to avoid spreading bacteria and viruses. °· Only take over-the-counter or prescription medicines for pain, discomfort, or fever as directed by your caregiver. °· Ask your caregiver if you should continue all prescribed and over-the-counter medicines. °· Keep all follow-up appointments with your caregiver. °SEEK MEDICAL CARE IF: °· You have abdominal pain and it increases or stays in one area (localizes). °· You have a rash, stiff neck, or severe headache. °· You are irritable, sleepy, or difficult to awaken. °· You are weak, dizzy, or extremely thirsty. °SEEK IMMEDIATE MEDICAL CARE IF:  °· You are unable to keep fluids down or you get worse despite treatment. °· You have frequent episodes of vomiting or diarrhea. °· You have blood or green matter (bile) in your vomit. °· You have blood in your stool or your stool looks black and tarry. °· You have not urinated in 6 to 8 hours, or you have only urinated a small amount of very dark urine. °· You have a fever. °· You faint. °MAKE SURE YOU:  °· Understand these instructions. °· Will watch your condition. °· Will get help right away if you are not doing well or get worse. °Document Released: 05/30/2005 Document Revised: 08/22/2011 Document Reviewed: 01/17/2011 °Exit[employer]® Patient Information ©2014 [employer]. °Urinary Tract Infection °Urinary tract infections (UTIs) can develop anywhere along your urinary tract. Your urinary tract is your body's drainage system for removing wastes and extra water.   water. Your urinary tract includes two kidneys, two ureters, a bladder, and a urethra. Your kidneys are a pair of bean-shaped organs. Each kidney is about the size of your fist. They are located below your ribs, one on each side of your spine. CAUSES Infections are caused by microbes, which are  microscopic organisms, including fungi, viruses, and bacteria. These organisms are so small that they can only be seen through a microscope. Bacteria are the microbes that most commonly cause UTIs. SYMPTOMS  Symptoms of UTIs may vary by age and gender of the patient and by the location of the infection. Symptoms in young women typically include a frequent and intense urge to urinate and a painful, burning feeling in the bladder or urethra during urination. Older women and men are more likely to be tired, shaky, and weak and have muscle aches and abdominal pain. A fever may mean the infection is in your kidneys. Other symptoms of a kidney infection include pain in your back or sides below the ribs, nausea, and vomiting. DIAGNOSIS To diagnose a UTI, your caregiver will ask you about your symptoms. Your caregiver also will ask to provide a urine sample. The urine sample will be tested for bacteria and white blood cells. White blood cells are made by your body to help fight infection. TREATMENT  Typically, UTIs can be treated with medication. Because most UTIs are caused by a bacterial infection, they usually can be treated with the use of antibiotics. The choice of antibiotic and length of treatment depend on your symptoms and the type of bacteria causing your infection. HOME CARE INSTRUCTIONS  If you were prescribed antibiotics, take them exactly as your caregiver instructs you. Finish the medication even if you feel better after you have only taken some of the medication.  Drink enough water and fluids to keep your urine clear or pale yellow.  Avoid caffeine, tea, and carbonated beverages. They tend to irritate your bladder.  Empty your bladder often. Avoid holding urine for long periods of time.  Empty your bladder before and after sexual intercourse.  After a bowel movement, women should cleanse from front to back. Use each tissue only once. SEEK MEDICAL CARE IF:   You have back pain.  You  develop a fever.  Your symptoms do not begin to resolve within 3 days. SEEK IMMEDIATE MEDICAL CARE IF:   You have severe back pain or lower abdominal pain.  You develop chills.  You have nausea or vomiting.  You have continued burning or discomfort with urination. MAKE SURE YOU:   Understand these instructions.  Will watch your condition.  Will get help right away if you are not doing well or get worse. Document Released: 03/09/2005 Document Revised: 11/29/2011 Document Reviewed: 07/08/2011 [facility] Patient Information 2014 [place].  Sinusitis Sinusitis is redness, soreness, and swelling (inflammation) of the paranasal sinuses. Paranasal sinuses are air pockets within the bones of your face (beneath the eyes, the middle of the forehead, or above the eyes). In healthy paranasal sinuses, mucus is able to drain out, and air is able to circulate through them by way of your nose. However, when your paranasal sinuses are inflamed, mucus and air can become trapped. This can allow bacteria and other germs to grow and cause infection. Sinusitis can develop quickly and last only a short time (acute) or continue over a long period (chronic). Sinusitis that lasts for more than 12 weeks is considered chronic.  CAUSES  Causes of sinusitis include:  Allergies.  Structural abnormalities, such as displacement of the cartilage that separates your nostrils (deviated septum), which can decrease the air flow through your nose and sinuses and affect sinus drainage.  Functional abnormalities, such as when the small hairs (cilia) that line your sinuses and help remove mucus do not work properly or are not present. SYMPTOMS  Symptoms of acute and chronic sinusitis are the same. The primary symptoms are pain and pressure around the affected sinuses. Other symptoms include:  Upper toothache.  Earache.  Headache.  Bad breath.  Decreased sense of smell and taste.  A cough, which worsens when  you are lying flat.  Fatigue.  Fever.  Thick drainage from your nose, which often is green and may contain pus (purulent).  Swelling and warmth over the affected sinuses. DIAGNOSIS  Your caregiver will perform a physical exam. During the exam, your caregiver may:  Look in your nose for signs of abnormal growths in your nostrils (nasal polyps).  Tap over the affected sinus to check for signs of infection.  View the inside of your sinuses (endoscopy) with a special imaging device with a light attached (endoscope), which is inserted into your sinuses. If your caregiver suspects that you have chronic sinusitis, one or more of the following tests may be recommended:  Allergy tests.  Nasal culture A sample of mucus is taken from your nose and sent to a lab and screened for bacteria.  Nasal cytology A sample of mucus is taken from your nose and examined by your caregiver to determine if your sinusitis is related to an allergy. TREATMENT  Most cases of acute sinusitis are related to a viral infection and will resolve on their own within 10 days. Sometimes medicines are prescribed to help relieve symptoms (pain medicine, decongestants, nasal steroid sprays, or saline sprays).  However, for sinusitis related to a bacterial infection, your caregiver will prescribe antibiotic medicines. These are medicines that will help kill the bacteria causing the infection.  Rarely, sinusitis is caused by a fungal infection. In theses cases, your caregiver will prescribe antifungal medicine. For some cases of chronic sinusitis, surgery is needed. Generally, these are cases in which sinusitis recurs more than 3 times per year, despite other treatments. HOME CARE INSTRUCTIONS   Drink plenty of water. Water helps thin the mucus so your sinuses can drain more easily.  Use a humidifier.  Inhale steam 3 to 4 times a day (for example, sit in the bathroom with the shower running).  Apply a warm, moist washcloth  to your face 3 to 4 times a day, or as directed by your caregiver.  Use saline nasal sprays to help moisten and clean your sinuses.  Take over-the-counter or prescription medicines for pain, discomfort, or fever only as directed by your caregiver. SEEK IMMEDIATE MEDICAL CARE IF:  You have increasing pain or severe headaches.  You have nausea, vomiting, or drowsiness.  You have swelling around your face.  You have vision problems.  You have a stiff neck.  You have difficulty breathing. MAKE SURE YOU:   Understand these instructions.  Will watch your condition.  Will get help right away if you are not doing well or get worse. Document Released: 05/30/2005 Document Revised: 08/22/2011 Document Reviewed: 06/14/2011 [facility] Patient Information 2014 [place], [place].

## 2013-10-31 NOTE — ED Notes (Signed)
Pt alert & oriented x4, stable gait. Patient given discharge instructions, paperwork & prescription(s). Patient  instructed to stop at the registration desk to finish any additional paperwork. Patient verbalized understanding. Pt left department w/ no further questions. 

## 2014-04-14 ENCOUNTER — Encounter (HOSPITAL_COMMUNITY): Payer: Self-pay | Admitting: Emergency Medicine

## 2014-12-17 ENCOUNTER — Ambulatory Visit (INDEPENDENT_AMBULATORY_CARE_PROVIDER_SITE_OTHER): Payer: Medicare Other | Admitting: Obstetrics and Gynecology

## 2014-12-17 ENCOUNTER — Encounter: Payer: Self-pay | Admitting: Obstetrics and Gynecology

## 2014-12-17 VITALS — BP 128/70 | HR 80 | Ht 66.5 in | Wt 221.0 lb

## 2014-12-17 DIAGNOSIS — N390 Urinary tract infection, site not specified: Secondary | ICD-10-CM | POA: Diagnosis not present

## 2014-12-17 DIAGNOSIS — N898 Other specified noninflammatory disorders of vagina: Secondary | ICD-10-CM | POA: Diagnosis not present

## 2014-12-17 LAB — POCT URINALYSIS DIPSTICK
GLUCOSE UA: NEGATIVE
Ketones, UA: NEGATIVE
NITRITE UA: POSITIVE

## 2014-12-17 NOTE — Progress Notes (Signed)
Erin Vazquez  Patient name: Erin Vazquez MRN [id number]  Date of birth: 30-Jun-1940  CC & HPI:  Erin Vazquez is a 74 y.o. female presenting today for constant vaginal discharge which has worsened recently. She reports having excess moisture throughout her life but states it continues to be bothersome. She denies vaginal pain at this time. She is not sexually active, has not been for "years".    She is also interested in becoming active and lifting weights and would like to be cleared for this.   ROS:  +vaginal discharge -vaginal pain Otherwise negative ROS  Pertinent History Reviewed:   Reviewed: Significant for  Medical         Past Medical History  Diagnosis Date  . Hypothyroid   . Arthritis   . Fibrocystic breast   . Ovarian cyst   . Genital herpes   . HTN (hypertension)   . Seasonal allergies   . Eczema                               Surgical Hx:    Past Surgical History  Procedure Laterality Date  . Tubal ligation    . Mastectomy, partial  left  . Breast reconstruction    . Cervix removal    . Shoulder surgery  lipoma excision  . Knee arthroscopy  left 2010 Dr. Aline Brochure   Medications: Reviewed & Updated - see associated section                       Current outpatient prescriptions:  .  levothyroxine (SYNTHROID, LEVOTHROID) 75 MCG tablet, Take 75 mcg by mouth daily.  , Disp: , Rfl:  .  losartan-hydrochlorothiazide (HYZAAR) 50-12.5 MG per tablet, Take 1 tablet by mouth daily., Disp: , Rfl:    Social History: Reviewed -  reports that she quit smoking about 49 years ago. Her smoking use included Cigarettes. She has a 4 pack-year smoking history. She has never used smokeless tobacco.  Objective Findings:  Vitals: Blood pressure 128/70, pulse 80, height 5' 6.5" (1.689 m), weight 221 lb (100.245 kg).  Physical Examination: General appearance - alert, well appearing, and in no distress and oriented to person, place, and time Mental status - alert,  oriented to person, place, and time, normal mood, behavior, speech, dress, motor activity, and thought processes Pelvic - VULVA: normal appearing vulva with no masses, tenderness or lesions,  VAGINA: normal appearing vagina with normal color, generous secretions clear, no lesions,  CERVIX: normal appearing cervix without discharge or lesions,  UTERUS: uterus is normal size, shape, consistency and nontender,  ADNEXA: normal adnexa in size, nontender and no masses, Skin: actinic keratosis  present left breast. Axilla: No adenopathy, right humeral head more prominent as noted by patient, patient reassured    Assessment & Plan:   A:  1. Normal axilla 2. Physiologic normal vaginal secretions, generous volume  P:  1. Reassure of normalcy of vaginal secretions  2. Follow up PRN 3. Consider brief tampon use 1-2 times weekly  This chart was scribed by Ludger Nutting, Medical Scribe, for Dr. Mallory Shirk on 12/17/14 at 11:08 AM. This chart was reviewed by Dr. Mallory Shirk for accuracy.   I personally performed the services described in this documentation, which was SCRIBED in my presence. The recorded information has been reviewed and considered accurate. It has been edited as necessary during review. Mallory Shirk  V, MD

## 2014-12-17 NOTE — Progress Notes (Signed)
Patient ID: Erin Vazquez, female   DOB: 1940/09/17, 74 y.o.   MRN: [id number] Pt here today for vaginal discharge. Pt states " I have always been wet, but I thought at my age it is a problem". Pt states that she has lost 8 pounds in a month and has not really been trying. Pt denies any vaginal pain. Pt states that she gets pain in her left thigh at times and it is very uncomfortable, and her toes will stretch out and spasm. Urine was dipped and showed positive nitrates, a trace of blood and protein and 1 of leukocytes. Urine will be sent to lab for culture. Pt denies any symptoms of this at this time.

## 2014-12-19 LAB — URINE CULTURE

## 2014-12-22 ENCOUNTER — Other Ambulatory Visit: Payer: Self-pay | Admitting: Obstetrics and Gynecology

## 2014-12-22 DIAGNOSIS — N3000 Acute cystitis without hematuria: Secondary | ICD-10-CM

## 2014-12-22 MED ORDER — CEPHALEXIN 500 MG PO CAPS: 500.0000 mg | ORAL_CAPSULE | Freq: Four times a day (QID) | ORAL | Status: DC

## 2015-01-01 DIAGNOSIS — I10 Essential (primary) hypertension: Secondary | ICD-10-CM | POA: Diagnosis not present

## 2015-01-01 DIAGNOSIS — E039 Hypothyroidism, unspecified: Secondary | ICD-10-CM | POA: Diagnosis not present

## 2015-01-01 DIAGNOSIS — D649 Anemia, unspecified: Secondary | ICD-10-CM | POA: Diagnosis not present

## 2015-01-01 DIAGNOSIS — D510 Vitamin B12 deficiency anemia due to intrinsic factor deficiency: Secondary | ICD-10-CM | POA: Diagnosis not present

## 2015-03-20 ENCOUNTER — Encounter: Payer: Self-pay | Admitting: Obstetrics and Gynecology

## 2015-03-20 ENCOUNTER — Ambulatory Visit (INDEPENDENT_AMBULATORY_CARE_PROVIDER_SITE_OTHER): Payer: Medicare Other | Admitting: Obstetrics and Gynecology

## 2015-03-20 VITALS — BP 130/80 | HR 64 | Ht 66.5 in | Wt 220.0 lb

## 2015-03-20 DIAGNOSIS — R202 Paresthesia of skin: Secondary | ICD-10-CM | POA: Diagnosis not present

## 2015-03-20 DIAGNOSIS — B360 Pityriasis versicolor: Secondary | ICD-10-CM

## 2015-03-20 MED ORDER — KETOCONAZOLE 2 % EX CREA: 1.0000 "application " | TOPICAL_CREAM | Freq: Every day | CUTANEOUS | Status: DC

## 2015-03-20 NOTE — Progress Notes (Signed)
[facility] Visit  Patient name: Erin Vazquez MRN [id number]  Date of birth: 12/08/1940  CC & HPI:  Erin Vazquez is a 74 y.o. female presenting today for a rash to her vaginal area. She has several unassociated concerns .  She is unsure if she could possibly be having an other outbreak of herpes.  1. She states she is also having a an itchy, burning, rash to her bilateral arms, thighs and neck. Pt reports  myalgias in left arm associated with long-term phone use, when she holds her left arm and holes found her history of prolonged period of time she develops pain and discomfort in the arm 2. She has a 4 cm cervical of lightened skin on the left forearm that may be yeast. She went to her PCP and was given Triamcinolone which has not given her any relief. 3. Pt notes that she has constant, vaginal moisture and wears a pad on a regular basis. She put tea tree oil on her pad to help with this and she began to have vaginal irritation that she states is not painful.  4. She states she has pain to her left 2nd finger and it feels tender to the touch. She previously cut the area with a hedge trimmer 1 year ago. She denies any known foreign body. She denies any redness to the area or swelling.  ROS:  10 Systems reviewed and all are negative for acute change except as noted in the HPI.  Pertinent History Reviewed:   Reviewed: Significant for genital herpes, arthritis, hypothyroid, and HTN Medical         Past Medical History  Diagnosis Date  . Hypothyroid   . Arthritis   . Fibrocystic breast   . Ovarian cyst   . Genital herpes   . HTN (hypertension)   . Seasonal allergies   . Eczema                               Surgical Hx:    Past Surgical History  Procedure Laterality Date  . Tubal ligation    . Mastectomy, partial  left  . Breast reconstruction    . Cervix removal    . Shoulder surgery  lipoma excision  . Knee arthroscopy  left 2010 Dr. Aline Brochure   Medications:  Reviewed & Updated - see associated section                       Current outpatient prescriptions:  .  Ascorbic Acid (VITAMIN C) 1000 MG tablet, Take 5,000 mg by mouth daily., Disp: , Rfl:  .  BIOTIN PO, Take by mouth daily., Disp: , Rfl:  .  Cholecalciferol (VITAMIN D-3 PO), Take 5,000 Units by mouth daily., Disp: , Rfl:  .  ferrous sulfate 325 (65 FE) MG tablet, Take 325 mg by mouth daily with breakfast., Disp: , Rfl:  .  levothyroxine (SYNTHROID, LEVOTHROID) 75 MCG tablet, Take 75 mcg by mouth daily.  , Disp: , Rfl:  .  losartan-hydrochlorothiazide (HYZAAR) 50-12.5 MG per tablet, Take 1 tablet by mouth daily., Disp: , Rfl:  .  UNABLE TO FIND, Tumeric-1 daily; Hawthorne FHLKT-6-2 tabs daily; Garlic- 1 daily, Disp: , Rfl:    Social History: Reviewed -  reports that she quit smoking about 49 years ago. Her smoking use included Cigarettes. She has a 4 pack-year smoking history. She has never used  smokeless tobacco.  Objective Findings:  Vitals: Blood pressure 130/80, pulse 64, height 5' 6.5" (1.689 m), weight 220 lb (99.791 kg).  Physical Examination: General appearance - alert, well appearing, and in no distress Mental status - alert, oriented to person, place, and time Pelvic -  1 cm depigmentation of left labia majora. Healthy appearance of internal lip. No masses normal epithelium otherwise Musculoskeletal - no joint tenderness, deformity or swelling Skin - 4cm circular area of blanched whitened skin around hair follicles most consistent with yeast   Assessment & Plan:   A:  1. Finger sensitivity due nerve regeneration 2. Tinea corpus 3. Shoulder and arm discomforts consider musculoskeletal   P:   1.  Pt educated about process of skin healing 2. Will give rx for ketoconazole 3. Gave stretches to do for mgmt of shoulder and arm pain. ?compartment syndrome   By signing my name below, I, Erling Conte, attest that this documentation has been prepared under the direction and  in the presence of Jonnie Kind, MD. Electronically Signed: Erling Conte, ED Scribe. 03/20/2015. 12:41 PM.  I personally performed the services described in this documentation, which was SCRIBED in my presence. The recorded information has been reviewed and considered accurate. It has been edited as necessary during review. Jonnie Kind, MD

## 2015-10-28 DIAGNOSIS — B081 Molluscum contagiosum: Secondary | ICD-10-CM | POA: Diagnosis not present

## 2015-10-28 DIAGNOSIS — L2089 Other atopic dermatitis: Secondary | ICD-10-CM | POA: Diagnosis not present

## 2015-12-07 DIAGNOSIS — I10 Essential (primary) hypertension: Secondary | ICD-10-CM | POA: Diagnosis not present

## 2015-12-07 DIAGNOSIS — Z6839 Body mass index (BMI) 39.0-39.9, adult: Secondary | ICD-10-CM | POA: Diagnosis not present

## 2015-12-07 DIAGNOSIS — E039 Hypothyroidism, unspecified: Secondary | ICD-10-CM | POA: Diagnosis not present

## 2015-12-07 DIAGNOSIS — E669 Obesity, unspecified: Secondary | ICD-10-CM | POA: Diagnosis not present

## 2016-01-06 DIAGNOSIS — R143 Flatulence: Secondary | ICD-10-CM | POA: Diagnosis not present

## 2016-01-06 DIAGNOSIS — N644 Mastodynia: Secondary | ICD-10-CM | POA: Diagnosis not present

## 2016-01-07 ENCOUNTER — Other Ambulatory Visit (HOSPITAL_COMMUNITY): Payer: Self-pay | Admitting: Family Medicine

## 2016-01-07 DIAGNOSIS — R143 Flatulence: Secondary | ICD-10-CM

## 2016-01-11 ENCOUNTER — Encounter: Payer: Self-pay | Admitting: Obstetrics and Gynecology

## 2016-01-11 ENCOUNTER — Ambulatory Visit (INDEPENDENT_AMBULATORY_CARE_PROVIDER_SITE_OTHER): Payer: PPO | Admitting: Obstetrics and Gynecology

## 2016-01-11 VITALS — BP 150/80 | Ht 66.5 in | Wt 242.0 lb

## 2016-01-11 DIAGNOSIS — D1720 Benign lipomatous neoplasm of skin and subcutaneous tissue of unspecified limb: Secondary | ICD-10-CM

## 2016-01-11 DIAGNOSIS — N644 Mastodynia: Secondary | ICD-10-CM | POA: Diagnosis not present

## 2016-01-11 DIAGNOSIS — R102 Pelvic and perineal pain: Secondary | ICD-10-CM

## 2016-01-11 NOTE — Progress Notes (Signed)
Patient ID: Erin Vazquez, female   DOB: [birth year], 75 y.o.   MRN: [id number]    [facility] Visit  @DATE @            Patient name: Erin Vazquez MRN [id number]  Date of birth: Apr 26, 1941  CC & HPI:  Erin Vazquez is a 75 y.o. female presenting today for increased belching. She also reports intermittent lateral right breast pain, which is worsened with right arm abduction. Pt also notes that she has noticed a mass in the area of her pain, but states this area has been evaluated by her PCP within the last month. She denies discharge from the area or from the nipple. Pt has a diagnostic mammogram scheduled tomorrow. Pt has h/o lipoma removal.   She also complains of ongoing, intermittent, stabbing pelvic pain, which is occasionally worsened with positional changes.   ROS:  Review of Systems  Genitourinary:       +pelvic pain  Musculoskeletal:       +lateral right breast pain, mass on right breast -breast discharge   Pertinent History Reviewed:   Reviewed: Significant for fibrocystic breast, breast reconstruction, partial mastectomy  Medical         Past Medical History:  Diagnosis Date  . Arthritis   . Eczema   . Fibrocystic breast   . Genital herpes   . HTN (hypertension)   . Hypothyroid   . Ovarian cyst   . Seasonal allergies                               Surgical Hx:    Past Surgical History:  Procedure Laterality Date  . BREAST RECONSTRUCTION    . CERVIX REMOVAL    . KNEE ARTHROSCOPY  left 2010 Dr. Aline Brochure  . MASTECTOMY, PARTIAL  left  . SHOULDER SURGERY  lipoma excision  . TUBAL LIGATION     Medications: Reviewed & Updated - see associated section                       Current Outpatient Prescriptions:  .  BIOTIN PO, Take by mouth daily., Disp: , Rfl:  .  Cholecalciferol (VITAMIN D-3 PO), Take 5,000 Units by mouth daily., Disp: , Rfl:  .  ferrous sulfate 325 (65 FE) MG tablet, Take 325 mg by mouth daily with breakfast., Disp: , Rfl:  .  levothyroxine  (SYNTHROID, LEVOTHROID) 75 MCG tablet, Take 75 mcg by mouth daily.  , Disp: , Rfl:  .  losartan-hydrochlorothiazide (HYZAAR) 50-12.5 MG per tablet, Take 1 tablet by mouth daily., Disp: , Rfl:  .  UNABLE TO FIND, Tumeric-1 daily; Hawthorne [id number] tabs daily; Garlic- 1 daily, Disp: , Rfl:    Social History: Reviewed -  reports that she quit smoking about 50 years ago. Her smoking use included Cigarettes. She has a 4.00 pack-year smoking history. She has never used smokeless tobacco.  Objective Findings:  Vitals: Blood pressure (!) 150/80, height 5' 6.5" (1.689 m), weight 242 lb (109.8 kg).  Physical Examination: General appearance - alert, well appearing, and in no distress Mental status - alert, oriented to person, place, and time Abdomen - soft, nontender, nondistended, no masses or organomegaly Breasts - breasts appear normal, no skin or nipple changes. 3.5 cm lipoma on right axilla, definitely separate from the humeral head.  Musculoskeletal - no joint tenderness, deformity or swelling. Evidence of a residual lipoma on  the right shoulder.  Extremities - peripheral pulses normal, no pedal edema, no clubbing or cyanosis Skin - normal coloration and turgor, no rashes, no suspicious skin lesions noted   Assessment & Plan:   A:  1. 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 No tenderness on abdominal exam  P:  1. F/u for diagnostic mammogram as scheduled  2. F/u in office in 1 year or as needed    By signing my name below, I, Hansel Feinstein, attest that this documentation has been prepared under the direction and in the presence of Jonnie Kind, MD. Electronically Signed: Hansel Feinstein, ED Scribe. 01/11/16. 10:59 AM.  I personally performed the services described in this documentation, which was SCRIBED in my presence. The recorded information has been reviewed and considered accurate. It has been edited as necessary during  review. Jonnie Kind, MD

## 2016-01-12 ENCOUNTER — Ambulatory Visit (HOSPITAL_COMMUNITY): Admission: RE | Admit: 2016-01-12 | Payer: PPO | Source: Ambulatory Visit

## 2016-01-12 ENCOUNTER — Ambulatory Visit (HOSPITAL_COMMUNITY)
Admission: RE | Admit: 2016-01-12 | Discharge: 2016-01-12 | Disposition: A | Discharge status: Home / Self Care | Payer: PPO | Source: Ambulatory Visit | Attending: Family Medicine | Admitting: Family Medicine

## 2016-01-12 DIAGNOSIS — K7689 Other specified diseases of liver: Secondary | ICD-10-CM | POA: Diagnosis not present

## 2016-01-12 DIAGNOSIS — R143 Flatulence: Secondary | ICD-10-CM | POA: Insufficient documentation

## 2016-01-12 DIAGNOSIS — N63 Unspecified lump in breast: Secondary | ICD-10-CM | POA: Diagnosis not present

## 2016-01-12 DIAGNOSIS — K8020 Calculus of gallbladder without cholecystitis without obstruction: Secondary | ICD-10-CM | POA: Insufficient documentation

## 2016-01-12 DIAGNOSIS — K760 Fatty (change of) liver, not elsewhere classified: Secondary | ICD-10-CM | POA: Diagnosis not present

## 2016-01-19 DIAGNOSIS — J209 Acute bronchitis, unspecified: Secondary | ICD-10-CM | POA: Diagnosis not present

## 2016-02-09 DIAGNOSIS — I10 Essential (primary) hypertension: Secondary | ICD-10-CM | POA: Diagnosis not present

## 2016-02-09 DIAGNOSIS — R143 Flatulence: Secondary | ICD-10-CM | POA: Diagnosis not present

## 2016-02-09 DIAGNOSIS — K8000 Calculus of gallbladder with acute cholecystitis without obstruction: Secondary | ICD-10-CM | POA: Diagnosis not present

## 2016-04-25 DIAGNOSIS — L258 Unspecified contact dermatitis due to other agents: Secondary | ICD-10-CM | POA: Diagnosis not present

## 2016-09-20 DIAGNOSIS — I10 Essential (primary) hypertension: Secondary | ICD-10-CM | POA: Diagnosis not present

## 2016-09-20 DIAGNOSIS — L26 Exfoliative dermatitis: Secondary | ICD-10-CM | POA: Diagnosis not present

## 2016-09-20 DIAGNOSIS — R32 Unspecified urinary incontinence: Secondary | ICD-10-CM | POA: Diagnosis not present

## 2016-09-20 DIAGNOSIS — R7303 Prediabetes: Secondary | ICD-10-CM | POA: Diagnosis not present

## 2016-09-20 DIAGNOSIS — Z111 Encounter for screening for respiratory tuberculosis: Secondary | ICD-10-CM | POA: Diagnosis not present

## 2016-09-20 DIAGNOSIS — E038 Other specified hypothyroidism: Secondary | ICD-10-CM | POA: Diagnosis not present

## 2016-11-02 DIAGNOSIS — R32 Unspecified urinary incontinence: Secondary | ICD-10-CM | POA: Diagnosis not present

## 2016-11-02 DIAGNOSIS — M179 Osteoarthritis of knee, unspecified: Secondary | ICD-10-CM | POA: Diagnosis not present

## 2016-11-02 DIAGNOSIS — E038 Other specified hypothyroidism: Secondary | ICD-10-CM | POA: Diagnosis not present

## 2016-11-02 DIAGNOSIS — I10 Essential (primary) hypertension: Secondary | ICD-10-CM | POA: Diagnosis not present

## 2016-11-02 DIAGNOSIS — R7303 Prediabetes: Secondary | ICD-10-CM | POA: Diagnosis not present

## 2016-12-07 DIAGNOSIS — M25572 Pain in left ankle and joints of left foot: Secondary | ICD-10-CM | POA: Diagnosis not present

## 2016-12-07 DIAGNOSIS — M1712 Unilateral primary osteoarthritis, left knee: Secondary | ICD-10-CM | POA: Diagnosis not present

## 2016-12-09 DIAGNOSIS — M1712 Unilateral primary osteoarthritis, left knee: Secondary | ICD-10-CM | POA: Diagnosis not present

## 2016-12-16 DIAGNOSIS — M1712 Unilateral primary osteoarthritis, left knee: Secondary | ICD-10-CM | POA: Diagnosis not present

## 2016-12-26 DIAGNOSIS — M1712 Unilateral primary osteoarthritis, left knee: Secondary | ICD-10-CM | POA: Diagnosis not present

## 2017-01-16 DIAGNOSIS — M7740 Metatarsalgia, unspecified foot: Secondary | ICD-10-CM | POA: Diagnosis not present

## 2017-01-16 DIAGNOSIS — M79675 Pain in left toe(s): Secondary | ICD-10-CM | POA: Diagnosis not present

## 2017-01-16 DIAGNOSIS — M1990 Unspecified osteoarthritis, unspecified site: Secondary | ICD-10-CM | POA: Diagnosis not present

## 2017-03-03 ENCOUNTER — Other Ambulatory Visit (HOSPITAL_COMMUNITY)
Admission: RE | Admit: 2017-03-03 | Discharge: 2017-03-03 | Disposition: A | Discharge status: Home / Self Care | Payer: PPO | Source: Ambulatory Visit | Attending: Obstetrics and Gynecology | Admitting: Obstetrics and Gynecology

## 2017-03-03 ENCOUNTER — Ambulatory Visit (INDEPENDENT_AMBULATORY_CARE_PROVIDER_SITE_OTHER): Payer: PPO | Admitting: Obstetrics and Gynecology

## 2017-03-03 ENCOUNTER — Encounter: Payer: Self-pay | Admitting: Obstetrics and Gynecology

## 2017-03-03 VITALS — BP 142/86 | HR 62 | Ht 66.0 in | Wt 237.6 lb

## 2017-03-03 DIAGNOSIS — Z01419 Encounter for gynecological examination (general) (routine) without abnormal findings: Secondary | ICD-10-CM

## 2017-03-03 DIAGNOSIS — Z1212 Encounter for screening for malignant neoplasm of rectum: Secondary | ICD-10-CM | POA: Diagnosis not present

## 2017-03-03 DIAGNOSIS — Z01411 Encounter for gynecological examination (general) (routine) with abnormal findings: Secondary | ICD-10-CM

## 2017-03-03 DIAGNOSIS — Z1211 Encounter for screening for malignant neoplasm of colon: Secondary | ICD-10-CM

## 2017-03-03 DIAGNOSIS — Z124 Encounter for screening for malignant neoplasm of cervix: Secondary | ICD-10-CM | POA: Diagnosis not present

## 2017-03-03 DIAGNOSIS — N898 Other specified noninflammatory disorders of vagina: Secondary | ICD-10-CM

## 2017-03-03 LAB — HEMOCCULT GUIAC POC 1CARD (OFFICE): FECAL OCCULT BLD: NEGATIVE

## 2017-03-03 LAB — POCT WET PREP WITH KOH: YEAST WET PREP PER HPF POC: NEGATIVE

## 2017-03-03 NOTE — Progress Notes (Addendum)
Assessment:  1. Annual Gyn Exam 2. 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 Clumps of small cells seen on wet prep, may represent an endocervical origin inflammation or other abnormal etiology, will treat for MetroGel and Diflucan and then reassess with endocervical curettage and endometrial biopsy  Plan:  1. pap smear done, next pap due in 3 years. 2. return annually or prn 3    Annual mammogram advised after age 79 4.   Prescribe Metrogel for 1 week and Diflucan 5.  Proceed withECC and endometrial biopsy after completion of MetroGel and Diflucan 6.  F/u in 3 weeks for ECC and endometrial biopsy   Subjective:  Erin Vazquez is a 76 y.o. female [phone] who presents for annual exam. No LMP recorded. Patient is postmenopausal. The patient has complaints today of constant heavy discharge that has been present for her entire life. She notes she has been to her PCP previously for the discharge but has not been diagnosed with anything. Pt is concerned about the smell associated with the discharge, but denies being able to smell it. She notes her sister was able to smell a 'fishy' smell. She has had constant, vaginal moisture and wears a pad on a regular basis. Pt was previously diagnosed with herpes, and is not sexually active, denies any active outbreaks in many years. She has had a mammogram in the past year.  Pt is also concerned about rashes around her neck, which have increased in frequency in the past 3 months.  Pt Is here today with her adopted daughter, she adopted her daughter in 2013, states she has had a few difficult years with her. There is a real control issue battle going on between the teenager and the adoptive mother  The following portions of the patient's history were reviewed and updated as appropriate: allergies, current medications, past family history, past medical history, past social history, past surgical history and  problem list. Past Medical History:  Diagnosis Date  . Arthritis   . Eczema   . Fibrocystic breast   . Genital herpes   . HTN (hypertension)   . Hypothyroid   . Ovarian cyst   . Seasonal allergies     Past Surgical History:  Procedure Laterality Date  . BREAST RECONSTRUCTION    . CERVIX REMOVAL    . KNEE ARTHROSCOPY  left 2010 Dr. Aline Brochure  . MASTECTOMY, PARTIAL  left  . SHOULDER SURGERY  lipoma excision  . TUBAL LIGATION       Current Outpatient Prescriptions:  .  BIOTIN PO, Take by mouth daily., Disp: , Rfl:  .  Cholecalciferol (VITAMIN D-3 PO), Take 5,000 Units by mouth daily., Disp: , Rfl:  .  ferrous sulfate 325 (65 FE) MG tablet, Take 325 mg by mouth daily with breakfast., Disp: , Rfl:  .  levothyroxine (SYNTHROID, LEVOTHROID) 75 MCG tablet, Take 75 mcg by mouth daily.  , Disp: , Rfl:  .  losartan-hydrochlorothiazide (HYZAAR) 50-12.5 MG per tablet, Take 1 tablet by mouth daily., Disp: , Rfl:  .  UNABLE TO FIND, Tumeric-1 daily; Hawthorne KVQQV-9-5 tabs daily; Garlic- 1 daily, Disp: , Rfl:   Review of Systems Constitutional: negative Gastrointestinal: negative Genitourinary: negative  Objective:  BP (!) 142/86 (BP Location: Left Arm, Patient Position: Sitting, Cuff Size: Large)   Pulse 62   Ht 5\' 6"  (1.676 m)   Wt 237 lb 9.6 oz (107.8 kg)   BMI 38.35 kg/m    BMI: Body mass index  is 38.35 kg/m.  General Appearance: Alert, appropriate appearance for age. No acute distress HEENT: Grossly normal Breast Exam: No masses or nodes.No dimpling, nipple retraction or discharge. Pelvic Exam:  External genitalia: appears normal, moderate amount of secretions Vaginal: atrophic vaginal tissue, Cervix: slight bleeding Adnexa: not done  Uterus:   Pap done, and GC/CHl  Wet Prep: -KOH -Yeast Small clumps of epithelial cells Extensive tiny thin Elongated rod shape bacteria equal in length to epithelial cells, present on both KOH and wet prep  Rectovaginal: normal  rectal, no masses and guaiac negative stool obtained  Skin: no rash or abnormalities Neurologic: Normal gait and speech, no tremor  Psychiatric: Alert and oriented, appropriate affect.  Urinalysis:Not done  Mallory Shirk. MD Pgr [phone] 8:59 AM    By signing my name below, I, Jabier Gauss, attest that this documentation has been prepared under the direction and in the presence of Jonnie Kind, MD. Electronically Signed: Jabier Gauss, Medical Scribe. 03/03/17. 8:59 AM.  I personally performed the services described in this documentation, which was SCRIBED in my presence. The recorded information has been reviewed and considered accurate. It has been edited as necessary during review. Jonnie Kind, MD

## 2017-03-06 ENCOUNTER — Telehealth: Payer: Self-pay | Admitting: Obstetrics and Gynecology

## 2017-03-06 NOTE — Telephone Encounter (Signed)
Patient called stating prescriptions were not sent to pharmacy from when she saw Dr Glo Herring last week. Informed patient I could call those in for her. Verbalized understanding.   Diflucan 150mg -take 1 tablet now then repeat in 3 days if symptoms persist. Dispense 2  Metrogel 0.45%-9 applicator at night x5 nights Dispense 70g  Orders called to pharmacy per last office note.

## 2017-03-07 LAB — CYTOLOGY - PAP
Chlamydia: NEGATIVE
Diagnosis: NEGATIVE
HPV (WINDOPATH): NOT DETECTED
Neisseria Gonorrhea: NEGATIVE

## 2017-03-24 ENCOUNTER — Ambulatory Visit (INDEPENDENT_AMBULATORY_CARE_PROVIDER_SITE_OTHER): Payer: PPO | Admitting: Obstetrics and Gynecology

## 2017-03-24 ENCOUNTER — Encounter: Payer: Self-pay | Admitting: Obstetrics and Gynecology

## 2017-03-24 VITALS — BP 170/70 | HR 75 | Ht 66.5 in | Wt 236.2 lb

## 2017-03-24 DIAGNOSIS — N898 Other specified noninflammatory disorders of vagina: Secondary | ICD-10-CM | POA: Diagnosis not present

## 2017-03-24 LAB — POCT WET PREP WITH KOH

## 2017-03-24 MED ORDER — METRONIDAZOLE 0.75 % VA GEL: 1.0000 | Freq: Every evening | VAGINAL | 1 refills | Status: DC | PRN

## 2017-03-24 NOTE — Progress Notes (Signed)
[facility] Visit  03/24/2017            Patient name: Erin Vazquez MRN [id number]  Date of birth: 10-21-1940  CC & HPI:  Erin Vazquez is a 76 y.o. female presenting today for f/u with ECC/endometrial biopsy for chronic leukorrhea, suspected vaginal infection. Pt was last seen on 03/03/17 for annual visit/pap smear (normal), and chronic leukorrhea with fishy smell. Pt was prescribed Diflucan and Metrogel, and asked to f/u in 3 weeks for further reassessment and endometrial biopsy/ECC. Pt states her discharge is less than it was 3 weeks ago, but still present.  Pt has had a tubal ligation.  Pt has an adopted daughter, who has given her a difficult time for the past few years.   ROS:  ROS  -fever +increased vaginal discharge All systems are negative except as noted in the HPI and PMH.     Pertinent History Reviewed:   Reviewed: Significant for tubal ligation, cervical removal, ovarian cyst, genital herpes Medical         Past Medical History:  Diagnosis Date  . Arthritis   . Eczema   . Fibrocystic breast   . Genital herpes   . HTN (hypertension)   . Hypothyroid   . Ovarian cyst   . Seasonal allergies                               Surgical Hx:    Past Surgical History:  Procedure Laterality Date  . BREAST RECONSTRUCTION    . CERVIX REMOVAL    . KNEE ARTHROSCOPY  left 2010 Dr. Aline Brochure  . MASTECTOMY, PARTIAL  left  . SHOULDER SURGERY  lipoma excision  . TUBAL LIGATION     Medications: Reviewed & Updated - see associated section                       Current Outpatient Prescriptions:  .  BIOTIN PO, Take by mouth daily., Disp: , Rfl:  .  Cholecalciferol (VITAMIN D-3 PO), Take 5,000 Units by mouth daily., Disp: , Rfl:  .  ferrous sulfate 325 (65 FE) MG tablet, Take 325 mg by mouth daily with breakfast., Disp: , Rfl:  .  levothyroxine (SYNTHROID, LEVOTHROID) 75 MCG tablet, Take 75 mcg by mouth daily.  , Disp: , Rfl:  .  losartan-hydrochlorothiazide (HYZAAR)  50-12.5 MG per tablet, Take 1 tablet by mouth daily., Disp: , Rfl:  .  UNABLE TO FIND, Tumeric-1 daily; Hawthorne OMVEH-2-0 tabs daily; Garlic- 1 daily; Herb for gallstones, Disp: , Rfl:    Social History: Reviewed -  reports that she quit smoking about 51 years ago. Her smoking use included Cigarettes. She has a 4.00 pack-year smoking history. She has never used smokeless tobacco.  Objective Findings:  Vitals: There were no vitals taken for this visit.  Physical Examination: General appearance - alert, well appearing, and in no distress Mental status - alert, oriented to person, place, and time Pelvic -  VULVA: normal appearing vulva with no masses, tenderness or lesions, modest amount of white , healthy looking discharge, looks like its coming from the cervix VAGINA: normal appearing vagina with normal color and discharge, no lesions,  CERVIX: normal UTERUS: not done ADNEXA: not done  Wet Prep/KOH done: Moderate white cells, non-specific   Assessment & Plan:   A:  1. Chronic leukorrhea, no source identified  P:  1. Continue Metrogel  1x/week, refill as needed 2. F/u PRN    By signing my name below, I, Izna Ahmed, attest that this documentation has been prepared under the direction and in the presence of Jonnie Kind, MD. Electronically Signed: Jabier Gauss, Medical Scribe. 03/24/17. 9:30 AM.  I personally performed the services described in this documentation, which was SCRIBED in my presence. The recorded information has been reviewed and considered accurate. It has been edited as necessary during review. Jonnie Kind, MD

## 2017-06-27 DIAGNOSIS — R7303 Prediabetes: Secondary | ICD-10-CM | POA: Diagnosis not present

## 2017-06-27 DIAGNOSIS — E038 Other specified hypothyroidism: Secondary | ICD-10-CM | POA: Diagnosis not present

## 2017-06-27 DIAGNOSIS — Z13220 Encounter for screening for lipoid disorders: Secondary | ICD-10-CM | POA: Diagnosis not present

## 2017-06-27 DIAGNOSIS — I10 Essential (primary) hypertension: Secondary | ICD-10-CM | POA: Diagnosis not present

## 2017-06-27 DIAGNOSIS — D179 Benign lipomatous neoplasm, unspecified: Secondary | ICD-10-CM | POA: Diagnosis not present

## 2017-06-27 DIAGNOSIS — M25579 Pain in unspecified ankle and joints of unspecified foot: Secondary | ICD-10-CM | POA: Diagnosis not present

## 2017-06-27 DIAGNOSIS — E2839 Other primary ovarian failure: Secondary | ICD-10-CM | POA: Diagnosis not present

## 2017-07-06 ENCOUNTER — Other Ambulatory Visit: Payer: Self-pay | Admitting: Internal Medicine

## 2017-07-06 DIAGNOSIS — E2839 Other primary ovarian failure: Secondary | ICD-10-CM

## 2017-07-19 ENCOUNTER — Other Ambulatory Visit: Payer: Self-pay

## 2017-07-19 DIAGNOSIS — I872 Venous insufficiency (chronic) (peripheral): Secondary | ICD-10-CM

## 2017-09-14 ENCOUNTER — Ambulatory Visit (HOSPITAL_COMMUNITY)
Admission: RE | Admit: 2017-09-14 | Discharge: 2017-09-14 | Disposition: A | Discharge status: Home / Self Care | Payer: PPO | Source: Ambulatory Visit | Attending: Vascular Surgery | Admitting: Vascular Surgery

## 2017-09-14 ENCOUNTER — Encounter: Payer: Self-pay | Admitting: Vascular Surgery

## 2017-09-14 ENCOUNTER — Ambulatory Visit: Payer: PPO | Admitting: Vascular Surgery

## 2017-09-14 VITALS — BP 152/92 | HR 61 | Temp 96.8°F | Resp 18 | Ht 64.0 in | Wt 234.0 lb

## 2017-09-14 DIAGNOSIS — I872 Venous insufficiency (chronic) (peripheral): Secondary | ICD-10-CM | POA: Insufficient documentation

## 2017-09-14 DIAGNOSIS — M7989 Other specified soft tissue disorders: Secondary | ICD-10-CM

## 2017-09-14 NOTE — Progress Notes (Signed)
Referring Physician: Dr Jeanie Cooks  Patient name: Erin Vazquez MRN: [id number] DOB: Aug 06, 1940 Sex: female  REASON FOR CONSULT: Left thigh pain leg swelling  HPI: Erin Vazquez is a 78 y.o. female, referred for evaluation of lateral left thigh pain and leg and ankle swelling.  She was previously seen by my partner Dr. early in 2012.  At that point she was complaining of swelling of her legs at the end of the day right worse than left.  She also had occasional numbness and tingling in her toes.  She had a noninvasive arterial exam at that point which was normal.  He also had a CT scan which showed no pelvic abnormalities.  The recommendation at that time was for leg elevation and compression therapy.  The patient states that she was intermittently compliant with her compression stockings currently she complains of primarily swelling at the ankle and tibial level.  She was also worried that she may be developing congestive heart failure.  She does not really complain of any significant varicosities or aching in her legs.  She gets an intermittent stabbing pain that occurs in her left lateral thigh.  This usually occurs while she is sitting or at nighttime when she is in bed.  Swelling gets worse around the ankles especially as she is on her feet all day.  He is trying to lose some weight.  Other medical problems include the joint arthritis, hypertension, of which are stable.  Past Medical History:  Diagnosis Date  . Arthritis   . Eczema   . Fibrocystic breast   . Genital herpes   . HTN (hypertension)   . Hypothyroid   . Ovarian cyst   . Seasonal allergies    Past Surgical History:  Procedure Laterality Date  . BREAST RECONSTRUCTION    . CERVIX REMOVAL    . KNEE ARTHROSCOPY  left 2010 Dr. Aline Brochure  . MASTECTOMY, PARTIAL  left  . SHOULDER SURGERY  lipoma excision  . TUBAL LIGATION      Family History  Problem Relation Age of Onset  . Diabetes Mother   . Hypertension Mother   .  Dementia Mother   . Cancer Father        pancreatic  . Multiple sclerosis Sister   . Drug abuse Brother   . Crohn's disease Brother   . Diabetes Maternal Grandmother   . Vision loss Maternal Grandmother   . Cancer Maternal Grandfather        prostate  . Cancer Paternal Grandmother        pancreatic    SOCIAL HISTORY: Social History   Socioeconomic History  . Marital status: Widowed    Spouse name: Not on file  . Number of children: Not on file  . Years of education: Not on file  . Highest education level: Not on file  Occupational History  . Occupation: retired    Fish farm manager: Engineer, manufacturing  Social Needs  . Financial resource strain: Not on file  . Food insecurity:    Worry: Not on file    Inability: Not on file  . Transportation needs:    Medical: Not on file    Non-medical: Not on file  Tobacco Use  . Smoking status: Former Smoker    Packs/day: 1.00    Years: 4.00    Pack years: 4.00    Types: Cigarettes    Last attempt to quit: 06/13/1965    Years since quitting: 52.2  . Smokeless tobacco: Never  Used  Substance and Sexual Activity  . Alcohol use: No  . Drug use: No  . Sexual activity: Not Currently    Birth control/protection: Post-menopausal  Lifestyle  . Physical activity:    Days per week: Not on file    Minutes per session: Not on file  . Stress: Not on file  Relationships  . Social connections:    Talks on phone: Not on file    Gets together: Not on file    Attends religious service: Not on file    Active member of club or organization: Not on file    Attends meetings of clubs or organizations: Not on file    Relationship status: Not on file  . Intimate partner violence:    Fear of current or ex partner: Not on file    Emotionally abused: Not on file    Physically abused: Not on file    Forced sexual activity: Not on file  Other Topics Concern  . Not on file  Social History Narrative  . Not on file    No Known Allergies  Current  Outpatient Medications  Medication Sig Dispense Refill  . BIOTIN PO Take by mouth daily.    . Cholecalciferol (VITAMIN D-3 PO) Take 5,000 Units by mouth daily.    . ferrous sulfate 325 (65 FE) MG tablet Take 325 mg by mouth daily with breakfast.    . levothyroxine (SYNTHROID, LEVOTHROID) 75 MCG tablet Take 75 mcg by mouth daily.      Marland Kitchen losartan-hydrochlorothiazide (HYZAAR) 50-12.5 MG per tablet Take 1 tablet by mouth daily.    . metroNIDAZOLE (METROGEL) 0.75 % vaginal gel Place 1 Applicatorful vaginally at bedtime as needed and may repeat dose one time if needed. Apply one applicatorful to vagina at bedtime for 5 days 70 g 1  . UNABLE TO FIND Tumeric-1 daily; Hawthorne BJSEG-3-1 tabs daily; Garlic- 1 daily; Herb for gallstones     No current facility-administered medications for this visit.     ROS:   General:  No weight loss, Fever, chills  HEENT: No recent headaches, no nasal bleeding, no visual changes, no sore throat  Neurologic: No dizziness, blackouts, seizures. No recent symptoms of stroke or mini- stroke. No recent episodes of slurred speech, or temporary blindness.  Cardiac: No recent episodes of chest pain/pressure, no shortness of breath at rest.  No shortness of breath with exertion.  Denies history of atrial fibrillation or irregular heartbeat  Vascular: No history of rest pain in feet.  No history of claudication.  No history of non-healing ulcer, No history of DVT   Pulmonary: No home oxygen, no productive cough, no hemoptysis,  No asthma or wheezing  Musculoskeletal:  [X]  Arthritis, [X]  Low back pain,  [X]  Joint pain  Hematologic:No history of hypercoagulable state.  No history of easy bleeding.  No history of anemia  Gastrointestinal: No hematochezia or melena,  No gastroesophageal reflux, no trouble swallowing  Urinary: [ ]  chronic Kidney disease, [ ]  on HD - [ ]  MWF or [ ]  TTHS, [ ]  Burning with urination, [ ]  Frequent urination, [ ]  Difficulty urinating;   Skin:  No rashes  Psychological: No history of anxiety,  No history of depression   Physical Examination  Vitals:   09/14/17 1216  BP: (!) 152/92  Pulse: 61  Resp: 18  Temp: (!) 96.8 F (36 C)  TempSrc: Oral  SpO2: 98%  Weight: 234 lb (106.1 kg)  Height: 5\' 4"  (1.626 m)  Body mass index is 40.17 kg/m.  General:  Alert and oriented, no acute distress HEENT: Normal Neck: No bruit or JVD Pulmonary: Clear to auscultation bilaterally Cardiac: Regular Rate and Rhythm without murmur Abdomen: Soft, non-tender, non-distended, no mass, obese  skin: No rash, few scattered reticular type varicosities in the thigh bilaterally also a few in the calf no prominent varicosities otherwise Extremity Pulses:  2+ radial, brachial, femoral, dorsalis pedis, absent posterior tibial pulses bilaterally Musculoskeletal: No deformity trace ankle and pretibial edema bilaterally  Neurologic: Upper and lower extremity motor 5/5 and symmetric  DATA:  Patient had a venous reflux exam today.  This did show evidence of reflux in the left common femoral and greater saphenous.  However the vein diameter was only 3 mm.  She also had some reflux in the right common femoral vein.  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  This probably contributes to her swelling symptoms.  I also discussed with her weight loss management as this can certainly improve leg swelling symptoms over time.  We also again discussed compression stockings.  Although she has reflux in her left greater saphenous vein it is of small diameter and not large enough to consider laser ablation.  I do not believe she has a component of peripheral arterial disease as she has palpable dorsalis pedis pulses bilaterally.  The patient also was concerned that her leg swelling might put her at risk for congestive heart failure.  I actually discussed with her that leg problems do not cause congestive heart failure however  that leg swelling can be an asymptomatic this and I would leave this at the discretion of Dr. Jeanie Cooks on whether or not she would need an echocardiogram.  She is not really symptomatic other than her leg swelling.   PLAN: Assured the patient today that her leg problems are not going to contribute to congestive heart failure.  I discussed with her wearing compression stockings and gave her a brochure regarding this to purchase these.  Also discussed with her leg elevation at the end of the day to relieve symptoms.  She will discuss further with Dr. Alvester Chou whether or not an echocardiogram is indicated.  She will follow-up with [name] on an as-needed basis.   Ruta Hinds, MD [facility] Office: [phone] Pager: [phone]

## 2017-10-06 ENCOUNTER — Encounter: Payer: Self-pay | Admitting: Podiatry

## 2017-10-06 ENCOUNTER — Ambulatory Visit (INDEPENDENT_AMBULATORY_CARE_PROVIDER_SITE_OTHER): Payer: PPO

## 2017-10-06 ENCOUNTER — Ambulatory Visit: Payer: PPO | Admitting: Podiatry

## 2017-10-06 DIAGNOSIS — M779 Enthesopathy, unspecified: Secondary | ICD-10-CM

## 2017-10-06 DIAGNOSIS — M19079 Primary osteoarthritis, unspecified ankle and foot: Secondary | ICD-10-CM

## 2017-10-06 NOTE — Progress Notes (Signed)
Subjective:    Patient ID: Erin Vazquez, female    DOB: 1940-07-26, 77 y.o.   MRN: [id number]  HPI [name] presents the office today for concerns of right foot pain which is been ongoing for about 2 years.  She states that she has flat feet as well.  She states that her ankle is also been hurting on both sides for the last couple of years.  She is tried over-the-counter inserts which helps some but she thinks that she is having more custom orthotic.  She gets pain mostly to the outside aspect of her ankle as well as an occasional sharp pain in the top of her foot.  She denies any recent injury or trauma.  She gets some occasional spasms in the muscles in her feet with walking at times.  She said no other treatment for this.  She denies any pain at night that wakes her up.  Denies any claudication symptoms.  No other concerns.   Review of Systems  All other systems reviewed and are negative.  Past Medical History:  Diagnosis Date  . Arthritis   . Eczema   . Fibrocystic breast   . Genital herpes   . HTN (hypertension)   . Hypothyroid   . Ovarian cyst   . Seasonal allergies     Past Surgical History:  Procedure Laterality Date  . BREAST RECONSTRUCTION    . CERVIX REMOVAL    . KNEE ARTHROSCOPY  left 2010 Dr. Aline Brochure  . MASTECTOMY, PARTIAL  left  . SHOULDER SURGERY  lipoma excision  . TUBAL LIGATION       Current Outpatient Medications:  .  BIOTIN PO, Take by mouth daily., Disp: , Rfl:  .  Cholecalciferol (VITAMIN D-3 PO), Take 5,000 Units by mouth daily., Disp: , Rfl:  .  ferrous sulfate 325 (65 FE) MG tablet, Take 325 mg by mouth daily with breakfast., Disp: , Rfl:  .  levothyroxine (SYNTHROID, LEVOTHROID) 75 MCG tablet, Take 75 mcg by mouth daily.  , Disp: , Rfl:  .  losartan-hydrochlorothiazide (HYZAAR) 50-12.5 MG per tablet, Take 1 tablet by mouth daily., Disp: , Rfl:  .  metroNIDAZOLE (METROGEL) 0.75 % vaginal gel, Place 1 Applicatorful vaginally at bedtime as needed  and may repeat dose one time if needed. Apply one applicatorful to vagina at bedtime for 5 days, Disp: 70 g, Rfl: 1 .  UNABLE TO FIND, Tumeric-1 daily; Hawthorne EZMOQ-9-4 tabs daily; Garlic- 1 daily; Herb for gallstones, Disp: , Rfl:   No Known Allergies  Social History   Socioeconomic History  . Marital status: Widowed    Spouse name: Not on file  . Number of children: Not on file  . Years of education: Not on file  . Highest education level: Not on file  Occupational History  . Occupation: retired    Fish farm manager: Engineer, manufacturing  Social Needs  . Financial resource strain: Not on file  . Food insecurity:    Worry: Not on file    Inability: Not on file  . Transportation needs:    Medical: Not on file    Non-medical: Not on file  Tobacco Use  . Smoking status: Former Smoker    Packs/day: 1.00    Years: 4.00    Pack years: 4.00    Types: Cigarettes    Last attempt to quit: 06/13/1965    Years since quitting: 52.3  . Smokeless tobacco: Never Used  Substance and Sexual Activity  . Alcohol use:  No  . Drug use: No  . Sexual activity: Not Currently    Birth control/protection: Post-menopausal  Lifestyle  . Physical activity:    Days per week: Not on file    Minutes per session: Not on file  . Stress: Not on file  Relationships  . Social connections:    Talks on phone: Not on file    Gets together: Not on file    Attends religious service: Not on file    Active member of club or organization: Not on file    Attends meetings of clubs or organizations: Not on file    Relationship status: Not on file  . Intimate partner violence:    Fear of current or ex partner: Not on file    Emotionally abused: Not on file    Physically abused: Not on file    Forced sexual activity: Not on file  Other Topics Concern  . Not on file  Social History Narrative  . Not on file         Objective:   Physical Exam General: AAO x3, NAD  Dermatological: Skin is warm, dry and supple  bilateral. Nails x 10 are well manicured; remaining integument appears unremarkable at this time. There are no open sores, no preulcerative lesions, no rash or signs of infection present.  Vascular: Dorsalis Pedis artery and Posterior Tibial artery pedal pulses are 2/4 bilateral with immedate capillary fill time. Pedal hair growth present. No varicosities and no lower extremity edema present bilateral. There is no pain with calf compression, swelling, warmth, erythema.   Neruologic: Grossly intact via light touch bilateral. Vibratory intact via tuning fork bilateral. Protective threshold with Semmes Wienstein monofilament intact to all pedal sites bilateral.   Musculoskeletal: There is a decrease in medial arch height upon weightbearing.  Nonweightbearing exam reveals ankle joint range of motion intact.  Subtalar joint is somewhat restricted.  There is tenderness mostly on the lateral aspect foot on the sinus tarsi bilaterally as well as the ATFL.  No pain to the PTFL, CFL, syndesmosis or deltoid ligament.  There is no gross ankle instability present bilaterally.  There is chronic swelling to bilateral ankles there is no erythema or increase in warmth.  Mild discomfort along the Lisfranc joint on the dorsal lateral aspect the right foot there is no specific area of tenderness today this is subjectively where she gets discomfort.  There is no specific area pinpoint bony tenderness or pain to vibratory sensation.  Achilles tendon appears to be intact.  Muscular strength 5/5 in all groups tested bilateral.  Gait: Unassisted, Nonantalgic.       Assessment & Plan:  77 year old female with chronic bilateral ankle, right foot pain likely due to arthritis/tendonitis, and mechanical changes -Treatment options discussed including all alternatives, risks, and complications -Etiology of symptoms were discussed -X-rays were obtained and reviewed with the patient.  No definitive evidence of acute fracture or  stress fracture identified.  Arthritic changes present. -Discussion regards to treatment options and she like to proceed with custom orthotics.  She is aware of the cost and she was molded for orthotics today. -We discussed shoe gear modifications as well. -Voltaren gel ordered. -We will see her back in 3 to 4 weeks to pick up orthotics or sooner if any issues are to arise.  She agrees with this plan.  Trula Slade DPM

## 2017-10-08 MED ORDER — DICLOFENAC SODIUM 1 % TD GEL: 2.0000 g | Freq: Four times a day (QID) | TRANSDERMAL | 2 refills | Status: DC

## 2017-10-10 DIAGNOSIS — S161XXA Strain of muscle, fascia and tendon at neck level, initial encounter: Secondary | ICD-10-CM | POA: Diagnosis not present

## 2017-10-10 DIAGNOSIS — S39012A Strain of muscle, fascia and tendon of lower back, initial encounter: Secondary | ICD-10-CM | POA: Diagnosis not present

## 2017-10-10 DIAGNOSIS — M25562 Pain in left knee: Secondary | ICD-10-CM | POA: Diagnosis not present

## 2017-10-10 DIAGNOSIS — S20211A Contusion of right front wall of thorax, initial encounter: Secondary | ICD-10-CM | POA: Diagnosis not present

## 2017-10-10 DIAGNOSIS — R1033 Periumbilical pain: Secondary | ICD-10-CM | POA: Diagnosis not present

## 2017-10-10 DIAGNOSIS — M25512 Pain in left shoulder: Secondary | ICD-10-CM | POA: Diagnosis not present

## 2017-10-10 DIAGNOSIS — K8020 Calculus of gallbladder without cholecystitis without obstruction: Secondary | ICD-10-CM | POA: Diagnosis not present

## 2017-10-10 DIAGNOSIS — R300 Dysuria: Secondary | ICD-10-CM | POA: Diagnosis not present

## 2017-10-18 DIAGNOSIS — D485 Neoplasm of uncertain behavior of skin: Secondary | ICD-10-CM | POA: Diagnosis not present

## 2017-10-19 DIAGNOSIS — M47812 Spondylosis without myelopathy or radiculopathy, cervical region: Secondary | ICD-10-CM | POA: Diagnosis not present

## 2017-10-19 DIAGNOSIS — D279 Benign neoplasm of unspecified ovary: Secondary | ICD-10-CM | POA: Diagnosis not present

## 2017-10-19 DIAGNOSIS — I10 Essential (primary) hypertension: Secondary | ICD-10-CM | POA: Diagnosis not present

## 2017-10-19 DIAGNOSIS — M4716 Other spondylosis with myelopathy, lumbar region: Secondary | ICD-10-CM | POA: Diagnosis not present

## 2017-10-19 DIAGNOSIS — R7303 Prediabetes: Secondary | ICD-10-CM | POA: Diagnosis not present

## 2017-10-19 DIAGNOSIS — M179 Osteoarthritis of knee, unspecified: Secondary | ICD-10-CM | POA: Diagnosis not present

## 2017-10-19 DIAGNOSIS — E038 Other specified hypothyroidism: Secondary | ICD-10-CM | POA: Diagnosis not present

## 2017-10-19 DIAGNOSIS — S8980XA Other specified injuries of unspecified lower leg, initial encounter: Secondary | ICD-10-CM | POA: Diagnosis not present

## 2017-10-23 ENCOUNTER — Other Ambulatory Visit: Payer: PPO | Admitting: Orthotics

## 2017-10-30 ENCOUNTER — Other Ambulatory Visit: Payer: PPO | Admitting: Orthotics

## 2017-11-08 ENCOUNTER — Ambulatory Visit: Payer: PPO | Admitting: Orthotics

## 2017-11-08 DIAGNOSIS — M1712 Unilateral primary osteoarthritis, left knee: Secondary | ICD-10-CM | POA: Diagnosis not present

## 2017-11-08 DIAGNOSIS — M779 Enthesopathy, unspecified: Secondary | ICD-10-CM

## 2017-11-08 DIAGNOSIS — M19079 Primary osteoarthritis, unspecified ankle and foot: Secondary | ICD-10-CM

## 2017-11-08 NOTE — Progress Notes (Signed)
Patient came in today to p/up functional foot orthotics.   The orthotics were assessed to both fit and function.  The F/O addressed the biomechanical issues/pathologies as intended, offering good longitudinal arch support, proper offloading, and foot support. There weren't any signs of discomfort or irritation.  The F/O fit properly in footwear with minimal trimming/adjustments. 

## 2017-11-21 DIAGNOSIS — I10 Essential (primary) hypertension: Secondary | ICD-10-CM | POA: Diagnosis not present

## 2017-11-21 DIAGNOSIS — M4716 Other spondylosis with myelopathy, lumbar region: Secondary | ICD-10-CM | POA: Diagnosis not present

## 2017-11-21 DIAGNOSIS — M179 Osteoarthritis of knee, unspecified: Secondary | ICD-10-CM | POA: Diagnosis not present

## 2017-11-21 DIAGNOSIS — R7303 Prediabetes: Secondary | ICD-10-CM | POA: Diagnosis not present

## 2017-11-21 DIAGNOSIS — E038 Other specified hypothyroidism: Secondary | ICD-10-CM | POA: Diagnosis not present

## 2017-11-29 DIAGNOSIS — M545 Low back pain: Secondary | ICD-10-CM | POA: Diagnosis not present

## 2017-12-05 ENCOUNTER — Other Ambulatory Visit: Payer: Self-pay

## 2017-12-05 DIAGNOSIS — D480 Neoplasm of uncertain behavior of bone and articular cartilage: Secondary | ICD-10-CM | POA: Diagnosis not present

## 2017-12-05 DIAGNOSIS — D1721 Benign lipomatous neoplasm of skin and subcutaneous tissue of right arm: Secondary | ICD-10-CM | POA: Diagnosis not present

## 2017-12-18 ENCOUNTER — Ambulatory Visit (HOSPITAL_COMMUNITY): Payer: PPO | Attending: Family Medicine

## 2017-12-18 DIAGNOSIS — G8929 Other chronic pain: Secondary | ICD-10-CM | POA: Diagnosis not present

## 2017-12-18 DIAGNOSIS — R262 Difficulty in walking, not elsewhere classified: Secondary | ICD-10-CM | POA: Insufficient documentation

## 2017-12-18 DIAGNOSIS — M545 Low back pain: Secondary | ICD-10-CM | POA: Insufficient documentation
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
## 2017-12-20 ENCOUNTER — Telehealth (HOSPITAL_COMMUNITY): Payer: Self-pay

## 2017-12-20 ENCOUNTER — Ambulatory Visit (HOSPITAL_COMMUNITY): Payer: PPO

## 2017-12-20 DIAGNOSIS — R262 Difficulty in walking, not elsewhere classified: Secondary | ICD-10-CM

## 2017-12-20 DIAGNOSIS — G8929 Other chronic pain: Secondary | ICD-10-CM

## 2017-12-20 DIAGNOSIS — M545 Low back pain: Secondary | ICD-10-CM | POA: Diagnosis not present

## 2017-12-20 NOTE — Telephone Encounter (Signed)
Called patient at 5:00 on her mobile number. PT explained that he spoke with referring ortho and they are in accordance for the patient to FU as able with orthopedics prior to resuming TP treatment. PT explained that her treatment appointments have been canceled through end of the month and that she can call to reschedule as needed once she has seen the doctor.  5:07 PM, 12/20/17 Etta Grandchild, PT, DPT Physical Therapist at [place] [phone] (office)
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
## 2017-12-22 DIAGNOSIS — M545 Low back pain: Secondary | ICD-10-CM | POA: Diagnosis not present

## 2017-12-26 DIAGNOSIS — I872 Venous insufficiency (chronic) (peripheral): Secondary | ICD-10-CM | POA: Diagnosis not present

## 2017-12-26 DIAGNOSIS — M4716 Other spondylosis with myelopathy, lumbar region: Secondary | ICD-10-CM | POA: Diagnosis not present

## 2017-12-26 DIAGNOSIS — R7303 Prediabetes: Secondary | ICD-10-CM | POA: Diagnosis not present

## 2017-12-26 DIAGNOSIS — I10 Essential (primary) hypertension: Secondary | ICD-10-CM | POA: Diagnosis not present

## 2017-12-26 DIAGNOSIS — M179 Osteoarthritis of knee, unspecified: Secondary | ICD-10-CM | POA: Diagnosis not present

## 2017-12-27 ENCOUNTER — Encounter (HOSPITAL_COMMUNITY): Payer: PPO | Admitting: Physical Therapy

## 2017-12-29 ENCOUNTER — Encounter (HOSPITAL_COMMUNITY): Payer: PPO | Admitting: Physical Therapy

## 2018-01-01 ENCOUNTER — Encounter (HOSPITAL_COMMUNITY): Payer: PPO | Admitting: Physical Therapy

## 2018-01-02 DIAGNOSIS — M545 Low back pain: Secondary | ICD-10-CM | POA: Diagnosis not present

## 2018-01-03 ENCOUNTER — Encounter (HOSPITAL_COMMUNITY): Payer: PPO | Admitting: Physical Therapy

## 2018-01-09 ENCOUNTER — Ambulatory Visit (HOSPITAL_COMMUNITY): Payer: PPO | Admitting: Physical Therapy

## 2018-01-09 ENCOUNTER — Telehealth (HOSPITAL_COMMUNITY): Payer: Self-pay | Admitting: Physical Therapy

## 2018-01-09 DIAGNOSIS — M25551 Pain in right hip: Secondary | ICD-10-CM | POA: Diagnosis not present

## 2018-01-09 DIAGNOSIS — M545 Low back pain: Secondary | ICD-10-CM | POA: Diagnosis not present

## 2018-01-09 DIAGNOSIS — M4316 Spondylolisthesis, lumbar region: Secondary | ICD-10-CM | POA: Diagnosis not present

## 2018-01-09 NOTE — Telephone Encounter (Signed)
PT did not show for appointment.  Left message regarding missed appointment and to call clinic as last note by Rebbeca Paul reflects a hold until patient returns to MD.  Marena Chancy if patient has called and scheduled remaining appointments or if these need to be cancelled.  Teena Irani, PTA/CLT [phone]

## 2018-01-10 ENCOUNTER — Telehealth (HOSPITAL_COMMUNITY): Payer: Self-pay | Admitting: Family Medicine

## 2018-01-10 NOTE — Telephone Encounter (Signed)
01/10/18  pt called to cx all appts for now.  she is getting an epidural on 8/9 and dr is going to have to figure out what is going on and she will be back so don't discharge her she says

## 2018-01-11 ENCOUNTER — Ambulatory Visit (HOSPITAL_COMMUNITY): Payer: PPO

## 2018-01-15 ENCOUNTER — Encounter (HOSPITAL_COMMUNITY): Payer: PPO | Admitting: Physical Therapy

## 2018-01-18 ENCOUNTER — Encounter (HOSPITAL_COMMUNITY): Payer: PPO | Admitting: Physical Therapy

## 2018-01-19 DIAGNOSIS — M4316 Spondylolisthesis, lumbar region: Secondary | ICD-10-CM | POA: Diagnosis not present

## 2018-01-19 DIAGNOSIS — M545 Low back pain: Secondary | ICD-10-CM | POA: Diagnosis not present

## 2018-01-22 ENCOUNTER — Encounter (HOSPITAL_COMMUNITY): Payer: PPO

## 2018-01-25 ENCOUNTER — Encounter (HOSPITAL_COMMUNITY): Payer: PPO | Admitting: Physical Therapy

## 2018-02-01 DIAGNOSIS — E038 Other specified hypothyroidism: Secondary | ICD-10-CM | POA: Diagnosis not present

## 2018-02-01 DIAGNOSIS — M4716 Other spondylosis with myelopathy, lumbar region: Secondary | ICD-10-CM | POA: Diagnosis not present

## 2018-02-01 DIAGNOSIS — R7303 Prediabetes: Secondary | ICD-10-CM | POA: Diagnosis not present

## 2018-02-01 DIAGNOSIS — I10 Essential (primary) hypertension: Secondary | ICD-10-CM | POA: Diagnosis not present

## 2018-02-22 DIAGNOSIS — M4316 Spondylolisthesis, lumbar region: Secondary | ICD-10-CM | POA: Diagnosis not present

## 2018-02-22 DIAGNOSIS — M545 Low back pain: Secondary | ICD-10-CM | POA: Diagnosis not present

## 2018-02-28 ENCOUNTER — Ambulatory Visit (HOSPITAL_COMMUNITY): Payer: PPO

## 2018-02-28 ENCOUNTER — Encounter (HOSPITAL_COMMUNITY): Payer: Self-pay

## 2018-03-08 ENCOUNTER — Other Ambulatory Visit: Payer: Self-pay

## 2018-03-08 ENCOUNTER — Ambulatory Visit (HOSPITAL_COMMUNITY): Payer: PPO | Attending: Physical Medicine and Rehabilitation

## 2018-03-08 ENCOUNTER — Encounter (HOSPITAL_COMMUNITY): Payer: Self-pay

## 2018-03-08 DIAGNOSIS — M545 Low back pain: Secondary | ICD-10-CM | POA: Diagnosis not present

## 2018-03-08 DIAGNOSIS — R29898 Other symptoms and signs involving the musculoskeletal system: Secondary | ICD-10-CM

## 2018-03-08 DIAGNOSIS — G8929 Other chronic pain: Secondary | ICD-10-CM | POA: Diagnosis not present

## 2018-03-08 DIAGNOSIS — M6281 Muscle weakness (generalized): Secondary | ICD-10-CM | POA: Diagnosis not present
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
## 2018-03-08 NOTE — Patient Instructions (Signed)
Access Code: [email or web address]  URL: [email or web address]  Date: 03/08/2018  Prepared by: Geraldine Solar   Exercises Supine Single Knee to Chest - 3-5 reps - 30-60sec hold - 1-2x daily - 7x weekly Supine Lower Trunk Rotation - 5-10 reps - 5-10seconds hold - 1x daily - 7x weekly

## 2018-03-09 ENCOUNTER — Ambulatory Visit (HOSPITAL_COMMUNITY): Payer: PPO

## 2018-03-09 ENCOUNTER — Encounter (HOSPITAL_COMMUNITY): Payer: Self-pay

## 2018-03-09 ENCOUNTER — Other Ambulatory Visit: Payer: Self-pay

## 2018-03-09 DIAGNOSIS — M545 Low back pain: Secondary | ICD-10-CM | POA: Diagnosis not present

## 2018-03-09 DIAGNOSIS — R29898 Other symptoms and signs involving the musculoskeletal system: Secondary | ICD-10-CM

## 2018-03-09 DIAGNOSIS — G8929 Other chronic pain: Secondary | ICD-10-CM

## 2018-03-09 DIAGNOSIS — M6281 Muscle weakness (generalized): Secondary | ICD-10-CM
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
## 2018-03-12 ENCOUNTER — Ambulatory Visit (HOSPITAL_COMMUNITY): Payer: PPO

## 2018-03-12 DIAGNOSIS — M545 Low back pain: Principal | ICD-10-CM

## 2018-03-12 DIAGNOSIS — G8929 Other chronic pain: Secondary | ICD-10-CM

## 2018-03-12 DIAGNOSIS — M6281 Muscle weakness (generalized): Secondary | ICD-10-CM

## 2018-03-12 DIAGNOSIS — R29898 Other symptoms and signs involving the musculoskeletal system: Secondary | ICD-10-CM
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
## 2018-03-14 ENCOUNTER — Telehealth (HOSPITAL_COMMUNITY): Payer: Self-pay | Admitting: Family Medicine

## 2018-03-14 ENCOUNTER — Ambulatory Visit (HOSPITAL_COMMUNITY): Payer: PPO

## 2018-03-14 NOTE — Telephone Encounter (Signed)
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

## 2018-03-15 ENCOUNTER — Ambulatory Visit (HOSPITAL_COMMUNITY): Payer: PPO | Attending: Physical Medicine and Rehabilitation

## 2018-03-15 ENCOUNTER — Other Ambulatory Visit: Payer: Self-pay

## 2018-03-15 ENCOUNTER — Encounter (HOSPITAL_COMMUNITY): Payer: Self-pay

## 2018-03-15 DIAGNOSIS — R29898 Other symptoms and signs involving the musculoskeletal system: Secondary | ICD-10-CM | POA: Insufficient documentation

## 2018-03-15 DIAGNOSIS — R262 Difficulty in walking, not elsewhere classified: Secondary | ICD-10-CM | POA: Diagnosis not present

## 2018-03-15 DIAGNOSIS — G8929 Other chronic pain: Secondary | ICD-10-CM | POA: Insufficient documentation

## 2018-03-15 DIAGNOSIS — M6281 Muscle weakness (generalized): Secondary | ICD-10-CM | POA: Diagnosis not present

## 2018-03-15 DIAGNOSIS — M545 Low back pain: Secondary | ICD-10-CM | POA: Diagnosis not present
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
## 2018-03-21 ENCOUNTER — Ambulatory Visit (HOSPITAL_COMMUNITY): Payer: PPO

## 2018-03-21 ENCOUNTER — Encounter (HOSPITAL_COMMUNITY): Payer: Self-pay

## 2018-03-21 DIAGNOSIS — R29898 Other symptoms and signs involving the musculoskeletal system: Secondary | ICD-10-CM

## 2018-03-21 DIAGNOSIS — M545 Low back pain, unspecified: Secondary | ICD-10-CM

## 2018-03-21 DIAGNOSIS — G8929 Other chronic pain: Secondary | ICD-10-CM

## 2018-03-21 DIAGNOSIS — R262 Difficulty in walking, not elsewhere classified: Secondary | ICD-10-CM

## 2018-03-21 DIAGNOSIS — M6281 Muscle weakness (generalized): Secondary | ICD-10-CM | POA: Diagnosis not present
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
## 2018-03-23 ENCOUNTER — Encounter (HOSPITAL_COMMUNITY): Payer: Self-pay

## 2018-03-23 ENCOUNTER — Ambulatory Visit (HOSPITAL_COMMUNITY): Payer: PPO

## 2018-03-23 DIAGNOSIS — M6281 Muscle weakness (generalized): Secondary | ICD-10-CM

## 2018-03-23 DIAGNOSIS — G8929 Other chronic pain: Secondary | ICD-10-CM

## 2018-03-23 DIAGNOSIS — R262 Difficulty in walking, not elsewhere classified: Secondary | ICD-10-CM

## 2018-03-23 DIAGNOSIS — M545 Low back pain: Principal | ICD-10-CM

## 2018-03-23 DIAGNOSIS — R29898 Other symptoms and signs involving the musculoskeletal system: Secondary | ICD-10-CM
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
## 2018-03-28 ENCOUNTER — Ambulatory Visit (HOSPITAL_COMMUNITY): Payer: PPO

## 2018-03-28 ENCOUNTER — Encounter (HOSPITAL_COMMUNITY): Payer: Self-pay

## 2018-03-28 DIAGNOSIS — M6281 Muscle weakness (generalized): Secondary | ICD-10-CM

## 2018-03-28 DIAGNOSIS — R29898 Other symptoms and signs involving the musculoskeletal system: Secondary | ICD-10-CM

## 2018-03-28 DIAGNOSIS — G8929 Other chronic pain: Secondary | ICD-10-CM

## 2018-03-28 DIAGNOSIS — M545 Low back pain: Principal | ICD-10-CM

## 2018-03-28 DIAGNOSIS — R262 Difficulty in walking, not elsewhere classified: Secondary | ICD-10-CM
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
## 2018-03-30 ENCOUNTER — Ambulatory Visit (HOSPITAL_COMMUNITY): Payer: PPO

## 2018-03-30 ENCOUNTER — Encounter (HOSPITAL_COMMUNITY): Payer: Self-pay

## 2018-03-30 DIAGNOSIS — R262 Difficulty in walking, not elsewhere classified: Secondary | ICD-10-CM

## 2018-03-30 DIAGNOSIS — M6281 Muscle weakness (generalized): Secondary | ICD-10-CM | POA: Diagnosis not present

## 2018-03-30 DIAGNOSIS — M545 Low back pain: Principal | ICD-10-CM

## 2018-03-30 DIAGNOSIS — R29898 Other symptoms and signs involving the musculoskeletal system: Secondary | ICD-10-CM

## 2018-03-30 DIAGNOSIS — G8929 Other chronic pain: Secondary | ICD-10-CM
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
## 2018-04-03 ENCOUNTER — Other Ambulatory Visit: Payer: Self-pay

## 2018-04-03 ENCOUNTER — Ambulatory Visit (HOSPITAL_COMMUNITY): Payer: PPO

## 2018-04-03 ENCOUNTER — Telehealth (HOSPITAL_COMMUNITY): Payer: Self-pay

## 2018-04-03 ENCOUNTER — Encounter (HOSPITAL_COMMUNITY): Payer: Self-pay

## 2018-04-03 DIAGNOSIS — M6281 Muscle weakness (generalized): Secondary | ICD-10-CM | POA: Diagnosis not present

## 2018-04-03 DIAGNOSIS — R262 Difficulty in walking, not elsewhere classified: Secondary | ICD-10-CM

## 2018-04-03 DIAGNOSIS — M545 Low back pain, unspecified: Secondary | ICD-10-CM

## 2018-04-03 DIAGNOSIS — R29898 Other symptoms and signs involving the musculoskeletal system: Secondary | ICD-10-CM

## 2018-04-03 DIAGNOSIS — G8929 Other chronic pain: Secondary | ICD-10-CM
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
## 2018-04-03 NOTE — Telephone Encounter (Signed)
I called Erin Vazquez regarding her 8:15 Am appointment that she had not arrived for. She stated she thought her appointments were for Wednesday and Friday this week. I offered her a 9:45 appointment today and she agreed to come in for that.  Kipp Brood, PT, DPT Physical Therapist with [facility]  04/03/2018 8:38 AM

## 2018-04-06 ENCOUNTER — Encounter (HOSPITAL_COMMUNITY): Payer: Self-pay

## 2018-04-06 ENCOUNTER — Ambulatory Visit (HOSPITAL_COMMUNITY): Payer: PPO

## 2018-04-06 DIAGNOSIS — M545 Low back pain, unspecified: Secondary | ICD-10-CM

## 2018-04-06 DIAGNOSIS — R262 Difficulty in walking, not elsewhere classified: Secondary | ICD-10-CM

## 2018-04-06 DIAGNOSIS — M6281 Muscle weakness (generalized): Secondary | ICD-10-CM | POA: Diagnosis not present

## 2018-04-06 DIAGNOSIS — G8929 Other chronic pain: Secondary | ICD-10-CM

## 2018-04-06 DIAGNOSIS — R29898 Other symptoms and signs involving the musculoskeletal system: Secondary | ICD-10-CM
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
## 2018-04-11 ENCOUNTER — Ambulatory Visit (HOSPITAL_COMMUNITY): Payer: PPO

## 2018-04-11 ENCOUNTER — Encounter (HOSPITAL_COMMUNITY): Payer: Self-pay

## 2018-04-11 DIAGNOSIS — D485 Neoplasm of uncertain behavior of skin: Secondary | ICD-10-CM | POA: Diagnosis not present

## 2018-04-11 DIAGNOSIS — M6281 Muscle weakness (generalized): Secondary | ICD-10-CM | POA: Diagnosis not present

## 2018-04-11 DIAGNOSIS — R262 Difficulty in walking, not elsewhere classified: Secondary | ICD-10-CM

## 2018-04-11 DIAGNOSIS — M545 Low back pain, unspecified: Secondary | ICD-10-CM

## 2018-04-11 DIAGNOSIS — G8929 Other chronic pain: Secondary | ICD-10-CM

## 2018-04-11 DIAGNOSIS — R29898 Other symptoms and signs involving the musculoskeletal system: Secondary | ICD-10-CM
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
## 2018-04-11 NOTE — Patient Instructions (Signed)
Piriformis Stretch, Supine    Lie supine, one ankle crossed onto opposite knee. Holding bottom leg behind knee, gently pull legs toward chest until stretch is felt in buttock of top leg. Hold 30 seconds. For deeper stretch gently push top knee away from body.  Repeat 3 times per session. Do 1-2sessions per day.  Copyright  VHI. All rights reserved.   Piriformis Stretch, Sitting    Sit, one ankle on opposite knee, same-side hand on crossed knee. Push down on knee, keeping spine straight. Lean torso forward, with flat back, until tension is felt in hamstrings and gluteals of crossed-leg side. Hold 30 seconds.  Repeat 3 times per session. Do 1-2 sessions per day.  Copyright  VHI. All rights reserved.

## 2018-04-12 DIAGNOSIS — R7303 Prediabetes: Secondary | ICD-10-CM | POA: Diagnosis not present

## 2018-04-12 DIAGNOSIS — Z23 Encounter for immunization: Secondary | ICD-10-CM | POA: Diagnosis not present

## 2018-04-12 DIAGNOSIS — E038 Other specified hypothyroidism: Secondary | ICD-10-CM | POA: Diagnosis not present

## 2018-04-12 DIAGNOSIS — I10 Essential (primary) hypertension: Secondary | ICD-10-CM | POA: Diagnosis not present

## 2018-04-12 DIAGNOSIS — K8080 Other cholelithiasis without obstruction: Secondary | ICD-10-CM | POA: Diagnosis not present

## 2018-04-12 DIAGNOSIS — M4316 Spondylolisthesis, lumbar region: Secondary | ICD-10-CM | POA: Diagnosis not present

## 2018-04-12 DIAGNOSIS — M545 Low back pain: Secondary | ICD-10-CM | POA: Diagnosis not present

## 2018-04-12 DIAGNOSIS — D179 Benign lipomatous neoplasm, unspecified: Secondary | ICD-10-CM | POA: Diagnosis not present

## 2018-04-13 ENCOUNTER — Encounter (HOSPITAL_COMMUNITY): Payer: Self-pay

## 2018-04-13 ENCOUNTER — Ambulatory Visit (HOSPITAL_COMMUNITY): Payer: PPO | Attending: Physical Medicine and Rehabilitation

## 2018-04-13 DIAGNOSIS — R29898 Other symptoms and signs involving the musculoskeletal system: Secondary | ICD-10-CM | POA: Diagnosis not present

## 2018-04-13 DIAGNOSIS — G8929 Other chronic pain: Secondary | ICD-10-CM

## 2018-04-13 DIAGNOSIS — R262 Difficulty in walking, not elsewhere classified: Secondary | ICD-10-CM | POA: Insufficient documentation

## 2018-04-13 DIAGNOSIS — M545 Low back pain: Secondary | ICD-10-CM | POA: Insufficient documentation

## 2018-04-13 DIAGNOSIS — M6281 Muscle weakness (generalized): Secondary | ICD-10-CM | POA: Insufficient documentation
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
## 2018-04-15 DIAGNOSIS — S62102A Fracture of unspecified carpal bone, left wrist, initial encounter for closed fracture: Secondary | ICD-10-CM | POA: Diagnosis not present

## 2018-04-15 DIAGNOSIS — R609 Edema, unspecified: Secondary | ICD-10-CM | POA: Diagnosis not present

## 2018-04-15 DIAGNOSIS — M7989 Other specified soft tissue disorders: Secondary | ICD-10-CM | POA: Diagnosis not present

## 2018-04-15 DIAGNOSIS — S80919A Unspecified superficial injury of unspecified knee, initial encounter: Secondary | ICD-10-CM | POA: Diagnosis not present

## 2018-04-15 DIAGNOSIS — Y92410 Unspecified street and highway as the place of occurrence of the external cause: Secondary | ICD-10-CM | POA: Diagnosis not present

## 2018-04-15 DIAGNOSIS — S99911A Unspecified injury of right ankle, initial encounter: Secondary | ICD-10-CM | POA: Diagnosis not present

## 2018-04-15 DIAGNOSIS — Y998 Other external cause status: Secondary | ICD-10-CM | POA: Diagnosis not present

## 2018-04-15 DIAGNOSIS — S52592A Other fractures of lower end of left radius, initial encounter for closed fracture: Secondary | ICD-10-CM | POA: Diagnosis not present

## 2018-04-15 DIAGNOSIS — S93401A Sprain of unspecified ligament of right ankle, initial encounter: Secondary | ICD-10-CM | POA: Diagnosis not present

## 2018-04-16 ENCOUNTER — Telehealth (HOSPITAL_COMMUNITY): Payer: Self-pay | Admitting: Family Medicine
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
## 2018-04-17 ENCOUNTER — Ambulatory Visit (HOSPITAL_COMMUNITY): Payer: PPO

## 2018-04-19 ENCOUNTER — Encounter (HOSPITAL_COMMUNITY): Payer: PPO

## 2018-04-19 ENCOUNTER — Other Ambulatory Visit: Payer: Self-pay

## 2018-04-19 NOTE — Patient Outreach (Signed)
[facility]) Care Management  04/19/2018  Erin Vazquez 12/31/1940 [id number]  TELEPHONE SCREENING Referral date: 04/18/18 Referral source: utilization management Referral reason: transportation assistance to doctor appointments Insurance: health team advantage.  Attempt #1   Telephone call to patient regarding referral. Unable to reach patient. HIPAA compliant voice message left with call back phone number.   PLAN: RNCM will attempt 2nd telephone call to patient within 4 business days. RNCM will send outreach letter.   Quinn Plowman RN,BSN, [place] Telephonic  [phone]

## 2018-04-20 ENCOUNTER — Ambulatory Visit: Payer: Self-pay

## 2018-04-23 ENCOUNTER — Other Ambulatory Visit: Payer: Self-pay

## 2018-04-23 ENCOUNTER — Encounter (HOSPITAL_BASED_OUTPATIENT_CLINIC_OR_DEPARTMENT_OTHER): Payer: Self-pay

## 2018-04-23 DIAGNOSIS — Z9012 Acquired absence of left breast and nipple: Secondary | ICD-10-CM | POA: Diagnosis not present

## 2018-04-23 DIAGNOSIS — M25571 Pain in right ankle and joints of right foot: Secondary | ICD-10-CM | POA: Diagnosis not present

## 2018-04-23 DIAGNOSIS — R200 Anesthesia of skin: Secondary | ICD-10-CM | POA: Diagnosis not present

## 2018-04-23 DIAGNOSIS — Z79899 Other long term (current) drug therapy: Secondary | ICD-10-CM | POA: Diagnosis not present

## 2018-04-23 DIAGNOSIS — K8020 Calculus of gallbladder without cholecystitis without obstruction: Secondary | ICD-10-CM | POA: Diagnosis not present

## 2018-04-23 DIAGNOSIS — K5730 Diverticulosis of large intestine without perforation or abscess without bleeding: Secondary | ICD-10-CM | POA: Diagnosis not present

## 2018-04-23 DIAGNOSIS — D509 Iron deficiency anemia, unspecified: Secondary | ICD-10-CM | POA: Diagnosis not present

## 2018-04-23 DIAGNOSIS — M1990 Unspecified osteoarthritis, unspecified site: Secondary | ICD-10-CM | POA: Diagnosis not present

## 2018-04-23 DIAGNOSIS — N6012 Diffuse cystic mastopathy of left breast: Secondary | ICD-10-CM | POA: Diagnosis not present

## 2018-04-23 DIAGNOSIS — R7303 Prediabetes: Secondary | ICD-10-CM | POA: Diagnosis not present

## 2018-04-23 DIAGNOSIS — Z9851 Tubal ligation status: Secondary | ICD-10-CM | POA: Diagnosis not present

## 2018-04-23 DIAGNOSIS — S52502A Unspecified fracture of the lower end of left radius, initial encounter for closed fracture: Secondary | ICD-10-CM | POA: Diagnosis present

## 2018-04-23 DIAGNOSIS — E039 Hypothyroidism, unspecified: Secondary | ICD-10-CM | POA: Diagnosis not present

## 2018-04-23 DIAGNOSIS — Z87891 Personal history of nicotine dependence: Secondary | ICD-10-CM | POA: Diagnosis not present

## 2018-04-23 DIAGNOSIS — Z6841 Body Mass Index (BMI) 40.0 and over, adult: Secondary | ICD-10-CM | POA: Diagnosis not present

## 2018-04-23 DIAGNOSIS — N83209 Unspecified ovarian cyst, unspecified side: Secondary | ICD-10-CM | POA: Diagnosis not present

## 2018-04-23 DIAGNOSIS — I517 Cardiomegaly: Secondary | ICD-10-CM | POA: Diagnosis not present

## 2018-04-23 DIAGNOSIS — I872 Venous insufficiency (chronic) (peripheral): Secondary | ICD-10-CM | POA: Diagnosis not present

## 2018-04-23 DIAGNOSIS — I10 Essential (primary) hypertension: Secondary | ICD-10-CM | POA: Diagnosis not present

## 2018-04-23 DIAGNOSIS — L309 Dermatitis, unspecified: Secondary | ICD-10-CM | POA: Diagnosis not present

## 2018-04-23 DIAGNOSIS — M25532 Pain in left wrist: Secondary | ICD-10-CM | POA: Diagnosis not present

## 2018-04-23 NOTE — Progress Notes (Signed)
Spoke with:  [name] NPO:  After Midnight, no gum, candy, or mints   Arrival time: 0871XB Labs: Istat 8, EKG AM medications: Levothyroxine Pre op orders: No Ride home:  Holley Bouche (sitter) [phone]

## 2018-04-23 NOTE — Patient Outreach (Signed)
[facility]) Care Management  04/23/2018  KATJA BLUE 07/30/1940 [id number]  TELEPHONE SCREENING Referral date: 04/18/18 Referral source: utilization management Referral reason: transportation assistance to doctor appointments Insurance: health team advantage.  Attempt #2  Telephone call to patient regarding referral. Unable to reach patient. HIPAA compliant voice message left with call back phone number.   PLAN: RNCM will attempt 3rd telephone call to patient within 4 business days.   Quinn Plowman RN,BSN, [place] Telephonic  [phone]

## 2018-04-24 ENCOUNTER — Encounter (HOSPITAL_BASED_OUTPATIENT_CLINIC_OR_DEPARTMENT_OTHER): Payer: Self-pay

## 2018-04-24 ENCOUNTER — Ambulatory Visit (HOSPITAL_BASED_OUTPATIENT_CLINIC_OR_DEPARTMENT_OTHER): Payer: PPO | Admitting: Anesthesiology

## 2018-04-24 ENCOUNTER — Ambulatory Visit (HOSPITAL_BASED_OUTPATIENT_CLINIC_OR_DEPARTMENT_OTHER)
Admission: RE | Admit: 2018-04-24 | Discharge: 2018-04-24 | Disposition: A | Discharge status: Home / Self Care | Payer: PPO | Source: Ambulatory Visit | Attending: Orthopedic Surgery | Admitting: Orthopedic Surgery

## 2018-04-24 ENCOUNTER — Encounter (HOSPITAL_BASED_OUTPATIENT_CLINIC_OR_DEPARTMENT_OTHER)
Admission: RE | Disposition: A | Discharge status: Home / Self Care | Payer: Self-pay | Source: Ambulatory Visit | Attending: Orthopedic Surgery

## 2018-04-24 DIAGNOSIS — S62102A Fracture of unspecified carpal bone, left wrist, initial encounter for closed fracture: Secondary | ICD-10-CM | POA: Diagnosis not present

## 2018-04-24 DIAGNOSIS — D509 Iron deficiency anemia, unspecified: Secondary | ICD-10-CM | POA: Insufficient documentation

## 2018-04-24 DIAGNOSIS — M1990 Unspecified osteoarthritis, unspecified site: Secondary | ICD-10-CM | POA: Insufficient documentation

## 2018-04-24 DIAGNOSIS — N6012 Diffuse cystic mastopathy of left breast: Secondary | ICD-10-CM | POA: Insufficient documentation

## 2018-04-24 DIAGNOSIS — I872 Venous insufficiency (chronic) (peripheral): Secondary | ICD-10-CM | POA: Insufficient documentation

## 2018-04-24 DIAGNOSIS — I517 Cardiomegaly: Secondary | ICD-10-CM | POA: Insufficient documentation

## 2018-04-24 DIAGNOSIS — L309 Dermatitis, unspecified: Secondary | ICD-10-CM | POA: Diagnosis not present

## 2018-04-24 DIAGNOSIS — K8020 Calculus of gallbladder without cholecystitis without obstruction: Secondary | ICD-10-CM | POA: Diagnosis not present

## 2018-04-24 DIAGNOSIS — R200 Anesthesia of skin: Secondary | ICD-10-CM | POA: Insufficient documentation

## 2018-04-24 DIAGNOSIS — K5730 Diverticulosis of large intestine without perforation or abscess without bleeding: Secondary | ICD-10-CM | POA: Insufficient documentation

## 2018-04-24 DIAGNOSIS — E039 Hypothyroidism, unspecified: Secondary | ICD-10-CM | POA: Insufficient documentation

## 2018-04-24 DIAGNOSIS — Z9012 Acquired absence of left breast and nipple: Secondary | ICD-10-CM | POA: Insufficient documentation

## 2018-04-24 DIAGNOSIS — Z87891 Personal history of nicotine dependence: Secondary | ICD-10-CM | POA: Insufficient documentation

## 2018-04-24 DIAGNOSIS — N83209 Unspecified ovarian cyst, unspecified side: Secondary | ICD-10-CM | POA: Diagnosis not present

## 2018-04-24 DIAGNOSIS — R7303 Prediabetes: Secondary | ICD-10-CM | POA: Insufficient documentation

## 2018-04-24 DIAGNOSIS — S52502A Unspecified fracture of the lower end of left radius, initial encounter for closed fracture: Secondary | ICD-10-CM | POA: Diagnosis not present

## 2018-04-24 DIAGNOSIS — Z9851 Tubal ligation status: Secondary | ICD-10-CM | POA: Insufficient documentation

## 2018-04-24 DIAGNOSIS — Z6841 Body Mass Index (BMI) 40.0 and over, adult: Secondary | ICD-10-CM | POA: Insufficient documentation

## 2018-04-24 DIAGNOSIS — Z79899 Other long term (current) drug therapy: Secondary | ICD-10-CM | POA: Insufficient documentation

## 2018-04-24 DIAGNOSIS — I10 Essential (primary) hypertension: Secondary | ICD-10-CM | POA: Diagnosis not present

## 2018-04-24 HISTORY — DX: Personal history of diseases of the blood and blood-forming organs and certain disorders involving the immune mechanism: Z86.2

## 2018-04-24 HISTORY — DX: Anesthesia of skin: R20.0

## 2018-04-24 HISTORY — DX: Prediabetes: R73.03

## 2018-04-24 HISTORY — DX: Venous insufficiency (chronic) (peripheral): I87.2

## 2018-04-24 HISTORY — PX: OPEN REDUCTION INTERNAL FIXATION (ORIF) DISTAL RADIAL FRACTURE: SHX5989

## 2018-04-24 HISTORY — DX: Personal history of other diseases of the circulatory system: Z86.79

## 2018-04-24 HISTORY — DX: Diverticulosis of large intestine without perforation or abscess without bleeding: K57.30

## 2018-04-24 HISTORY — DX: Calculus of gallbladder without cholecystitis without obstruction: K80.20

## 2018-04-24 LAB — POCT I-STAT, CHEM 8
BUN: 21 mg/dL (ref 8–23)
CALCIUM ION: 1.14 mmol/L — AB (ref 1.15–1.40)
CHLORIDE: 104 mmol/L (ref 98–111)
Creatinine, Ser: 0.7 mg/dL (ref 0.44–1.00)
GLUCOSE: 135 mg/dL — AB (ref 70–99)
HCT: 38 % (ref 36.0–46.0)
Hemoglobin: 12.9 g/dL (ref 12.0–15.0)
Potassium: 4.1 mmol/L (ref 3.5–5.1)
Sodium: 140 mmol/L (ref 135–145)
TCO2: 26 mmol/L (ref 22–32)

## 2018-04-24 SURGERY — OPEN REDUCTION INTERNAL FIXATION (ORIF) DISTAL RADIUS FRACTURE
Anesthesia: Monitor Anesthesia Care | Site: Wrist | Laterality: Left

## 2018-04-24 MED ORDER — OXYCODONE HCL 5 MG/5ML PO SOLN
5.0000 mg | Freq: Once | ORAL | Status: DC | PRN
  Filled 2018-04-24: qty 5

## 2018-04-24 MED ORDER — DEXAMETHASONE SODIUM PHOSPHATE 4 MG/ML IJ SOLN
INTRAMUSCULAR | Status: DC | PRN
  Administered 2018-04-24: 4 mg via INTRAVENOUS

## 2018-04-24 MED ORDER — ONDANSETRON HCL 4 MG/2ML IJ SOLN
INTRAMUSCULAR | Status: AC
  Filled 2018-04-24: qty 2

## 2018-04-24 MED ORDER — ACETAMINOPHEN 500 MG PO TABS
1000.0000 mg | ORAL_TABLET | Freq: Once | ORAL | Status: AC
  Administered 2018-04-24: 1000 mg via ORAL
  Filled 2018-04-24: qty 2

## 2018-04-24 MED ORDER — HYDROMORPHONE HCL 1 MG/ML IJ SOLN
0.2500 mg | INTRAMUSCULAR | Status: DC | PRN
  Filled 2018-04-24: qty 0.5

## 2018-04-24 MED ORDER — DOCUSATE SODIUM 100 MG PO CAPS: 100.0000 mg | ORAL_CAPSULE | Freq: Two times a day (BID) | ORAL | 0 refills | Status: DC

## 2018-04-24 MED ORDER — ONDANSETRON HCL 4 MG/2ML IJ SOLN
INTRAMUSCULAR | Status: DC | PRN
  Administered 2018-04-24: 4 mg via INTRAVENOUS

## 2018-04-24 MED ORDER — GABAPENTIN 100 MG PO CAPS: 100.0000 mg | ORAL_CAPSULE | Freq: Three times a day (TID) | ORAL | 0 refills | Status: DC

## 2018-04-24 MED ORDER — DEXAMETHASONE SODIUM PHOSPHATE 10 MG/ML IJ SOLN
INTRAMUSCULAR | Status: AC
  Filled 2018-04-24: qty 1

## 2018-04-24 MED ORDER — LIDOCAINE HCL (CARDIAC) PF 100 MG/5ML IV SOSY
PREFILLED_SYRINGE | INTRAVENOUS | Status: DC | PRN
  Administered 2018-04-24: 40 mg via INTRAVENOUS
  Administered 2018-04-24: 60 mg via INTRAVENOUS

## 2018-04-24 MED ORDER — ACETAMINOPHEN 500 MG PO TABS: 1000.0000 mg | ORAL_TABLET | Freq: Three times a day (TID) | ORAL | 0 refills | Status: AC

## 2018-04-24 MED ORDER — PROPOFOL 10 MG/ML IV BOLUS
INTRAVENOUS | Status: AC
  Filled 2018-04-24: qty 20

## 2018-04-24 MED ORDER — MIDAZOLAM HCL 2 MG/2ML IJ SOLN
INTRAMUSCULAR | Status: AC
  Filled 2018-04-24: qty 2

## 2018-04-24 MED ORDER — LIDOCAINE 2% (20 MG/ML) 5 ML SYRINGE
INTRAMUSCULAR | Status: AC
  Filled 2018-04-24: qty 5

## 2018-04-24 MED ORDER — FENTANYL CITRATE (PF) 100 MCG/2ML IJ SOLN
INTRAMUSCULAR | Status: AC
  Filled 2018-04-24: qty 2

## 2018-04-24 MED ORDER — PROPOFOL 10 MG/ML IV BOLUS
INTRAVENOUS | Status: AC
  Filled 2018-04-24: qty 40

## 2018-04-24 MED ORDER — FENTANYL CITRATE (PF) 100 MCG/2ML IJ SOLN
100.0000 ug | INTRAMUSCULAR | Status: DC | PRN
  Administered 2018-04-24: 50 ug via INTRAVENOUS
  Filled 2018-04-24: qty 2

## 2018-04-24 MED ORDER — MIDAZOLAM HCL 2 MG/2ML IJ SOLN
2.0000 mg | INTRAMUSCULAR | Status: DC | PRN
  Administered 2018-04-24: 1 mg via INTRAVENOUS
  Filled 2018-04-24: qty 2

## 2018-04-24 MED ORDER — LACTATED RINGERS IV SOLN
INTRAVENOUS | Status: DC
  Administered 2018-04-24 (×2): via INTRAVENOUS
  Filled 2018-04-24: qty 1000

## 2018-04-24 MED ORDER — PROMETHAZINE HCL 25 MG/ML IJ SOLN
6.2500 mg | INTRAMUSCULAR | Status: DC | PRN
  Filled 2018-04-24: qty 1

## 2018-04-24 MED ORDER — OXYCODONE HCL 5 MG PO TABS
5.0000 mg | ORAL_TABLET | Freq: Once | ORAL | Status: DC | PRN
  Filled 2018-04-24: qty 1

## 2018-04-24 MED ORDER — ONDANSETRON HCL 4 MG PO TABS: 4.0000 mg | ORAL_TABLET | Freq: Three times a day (TID) | ORAL | 0 refills | Status: DC | PRN

## 2018-04-24 MED ORDER — CHLORHEXIDINE GLUCONATE 4 % EX LIQD
60.0000 mL | Freq: Once | CUTANEOUS | Status: DC
  Filled 2018-04-24: qty 118

## 2018-04-24 MED ORDER — CEFAZOLIN SODIUM-DEXTROSE 2-4 GM/100ML-% IV SOLN
INTRAVENOUS | Status: AC
  Filled 2018-04-24: qty 100

## 2018-04-24 MED ORDER — CEFAZOLIN SODIUM-DEXTROSE 2-4 GM/100ML-% IV SOLN
2.0000 g | INTRAVENOUS | Status: AC
  Administered 2018-04-24: 2 g via INTRAVENOUS
  Filled 2018-04-24: qty 100

## 2018-04-24 MED ORDER — ROPIVACAINE HCL 5 MG/ML IJ SOLN
INTRAMUSCULAR | Status: DC | PRN
  Administered 2018-04-24: 30 mL via PERINEURAL

## 2018-04-24 MED ORDER — FENTANYL CITRATE (PF) 100 MCG/2ML IJ SOLN
INTRAMUSCULAR | Status: DC | PRN
  Administered 2018-04-24 (×2): 25 ug via INTRAVENOUS

## 2018-04-24 MED ORDER — PROPOFOL 500 MG/50ML IV EMUL
INTRAVENOUS | Status: DC | PRN
  Administered 2018-04-24: 25 ug/kg/min via INTRAVENOUS

## 2018-04-24 MED ORDER — OXYCODONE HCL 5 MG PO TABS: 5.0000 mg | ORAL_TABLET | ORAL | 0 refills | Status: AC | PRN

## 2018-04-24 MED ORDER — ACETAMINOPHEN 500 MG PO TABS
ORAL_TABLET | ORAL | Status: AC
  Filled 2018-04-24: qty 2

## 2018-04-24 SURGICAL SUPPLY — 92 items
BANDAGE ACE 4X5 VEL STRL LF (GAUZE/BANDAGES/DRESSINGS) ×4 IMPLANT
BIT DRILL 2.2 SS TIBIAL (BIT) ×2 IMPLANT
BLADE SURG 15 STRL LF DISP TIS (BLADE) ×1 IMPLANT
BLADE SURG 15 STRL SS (BLADE) ×3
BNDG CMPR 9X4 STRL LF SNTH (GAUZE/BANDAGES/DRESSINGS)
BNDG COHESIVE 4X5 TAN STRL (GAUZE/BANDAGES/DRESSINGS) ×3 IMPLANT
BNDG ESMARK 4X9 LF (GAUZE/BANDAGES/DRESSINGS) IMPLANT
CANISTER SUCT 1200ML W/VALVE (MISCELLANEOUS) ×3 IMPLANT
CHLORAPREP W/TINT 26ML (MISCELLANEOUS) ×3 IMPLANT
CLOSURE STERI-STRIP 1/2X4 (GAUZE/BANDAGES/DRESSINGS)
CLOSURE WOUND 1/2 X4 (GAUZE/BANDAGES/DRESSINGS) ×1
CLSR STERI-STRIP ANTIMIC 1/2X4 (GAUZE/BANDAGES/DRESSINGS) IMPLANT
COVER BACK TABLE 60X90IN (DRAPES) ×3 IMPLANT
COVER WAND RF STERILE (DRAPES) ×6 IMPLANT
CUFF TOURN SGL LL 18 NRW (TOURNIQUET CUFF) ×3 IMPLANT
CUFF TOURNIQUET SINGLE 18IN (TOURNIQUET CUFF) IMPLANT
DECANTER SPIKE VIAL GLASS SM (MISCELLANEOUS) IMPLANT
DRAPE EXTREMITY T 121X128X90 (DRAPE) ×3 IMPLANT
DRAPE IMP U-DRAPE 54X76 (DRAPES) ×3 IMPLANT
DRAPE OEC MINIVIEW 54X84 (DRAPES) ×3 IMPLANT
DRAPE U-SHAPE 47X51 STRL (DRAPES) ×3 IMPLANT
DRSG PAD ABDOMINAL 8X10 ST (GAUZE/BANDAGES/DRESSINGS) IMPLANT
ELECT REM PT RETURN 9FT ADLT (ELECTROSURGICAL) ×3
ELECTRODE REM PT RTRN 9FT ADLT (ELECTROSURGICAL) ×1 IMPLANT
GAUZE SPONGE 4X4 12PLY STRL (GAUZE/BANDAGES/DRESSINGS) ×3 IMPLANT
GAUZE XEROFORM 1X8 LF (GAUZE/BANDAGES/DRESSINGS) IMPLANT
GLOVE BIO SURGEON STRL SZ7 (GLOVE) ×2 IMPLANT
GLOVE BIO SURGEON STRL SZ7.5 (GLOVE) ×3 IMPLANT
GLOVE BIO SURGEON STRL SZ8 (GLOVE) ×2 IMPLANT
GLOVE BIOGEL PI IND STRL 7.5 (GLOVE) IMPLANT
GLOVE BIOGEL PI IND STRL 8 (GLOVE) ×1 IMPLANT
GLOVE BIOGEL PI IND STRL 8.5 (GLOVE) IMPLANT
GLOVE BIOGEL PI INDICATOR 7.5 (GLOVE) ×2
GLOVE BIOGEL PI INDICATOR 8 (GLOVE) ×2
GLOVE BIOGEL PI INDICATOR 8.5 (GLOVE) ×2
GOWN STRL REUS W/ TWL LRG LVL3 (GOWN DISPOSABLE) IMPLANT
GOWN STRL REUS W/TWL LRG LVL3 (GOWN DISPOSABLE) ×6
GOWN STRL REUS W/TWL XL LVL3 (GOWN DISPOSABLE) ×3 IMPLANT
K-WIRE 1.6 (WIRE) ×9
K-WIRE FX5X1.6XNS BN SS (WIRE) ×3
KWIRE FX5X1.6XNS BN SS (WIRE) IMPLANT
NDL 1/2 CIR CATGUT .05X1.09 (NEEDLE) IMPLANT
NDL HYPO 25X1 1.5 SAFETY (NEEDLE) IMPLANT
NEEDLE 1/2 CIR CATGUT .05X1.09 (NEEDLE) IMPLANT
NEEDLE HYPO 25X1 1.5 SAFETY (NEEDLE) IMPLANT
NS IRRIG 1000ML POUR BTL (IV SOLUTION) ×3 IMPLANT
PACK BASIN DAY SURGERY FS (CUSTOM PROCEDURE TRAY) ×3 IMPLANT
PAD CAST 4YDX4 CTTN HI CHSV (CAST SUPPLIES) ×3 IMPLANT
PADDING CAST ABS 4INX4YD NS (CAST SUPPLIES) ×2
PADDING CAST ABS COTTON 4X4 ST (CAST SUPPLIES) ×1 IMPLANT
PADDING CAST COTTON 4X4 STRL (CAST SUPPLIES) ×3
PEG LOCKING SMOOTH 2.2X16 (Screw) ×2 IMPLANT
PEG LOCKING SMOOTH 2.2X18 (Peg) ×4 IMPLANT
PEG LOCKING SMOOTH 2.2X20 (Screw) ×8 IMPLANT
PENCIL BUTTON HOLSTER BLD 10FT (ELECTRODE) ×3 IMPLANT
PLATE LONG DVR LEFT (Plate) ×2 IMPLANT
SCREW LOCK 14X2.7X 3 LD TPR (Screw) IMPLANT
SCREW LOCK 16X2.7X 3 LD TPR (Screw) IMPLANT
SCREW LOCK 18X2.7X 3 LD TPR (Screw) IMPLANT
SCREW LOCKING 2.7X14 (Screw) ×6 IMPLANT
SCREW LOCKING 2.7X15MM (Screw) ×2 IMPLANT
SCREW LOCKING 2.7X16 (Screw) ×3 IMPLANT
SCREW LOCKING 2.7X18 (Screw) ×3 IMPLANT
SLEEVE SCD COMPRESS KNEE MED (MISCELLANEOUS) IMPLANT
SLING ARM FOAM STRAP LRG (SOFTGOODS) ×2 IMPLANT
SLING ARM IMMOBILIZER LRG (SOFTGOODS) ×2 IMPLANT
SLING ARM MED ADULT FOAM STRAP (SOFTGOODS) IMPLANT
SPLINT FAST PLASTER 5X30 (CAST SUPPLIES)
SPLINT PLASTER CAST FAST 5X30 (CAST SUPPLIES) IMPLANT
SPLINT PLASTER CAST XFAST 4X15 (CAST SUPPLIES) IMPLANT
SPLINT PLASTER XTRA FAST SET 4 (CAST SUPPLIES) ×20
STOCKINETTE IMPERVIOUS LG (DRAPES) ×3 IMPLANT
STRIP CLOSURE SKIN 1/2X4 (GAUZE/BANDAGES/DRESSINGS) ×1 IMPLANT
SUCTION FRAZIER HANDLE 10FR (MISCELLANEOUS) ×2
SUCTION TUBE FRAZIER 10FR DISP (MISCELLANEOUS) ×1 IMPLANT
SUT FIBERWIRE #2 38 REV NDL BL (SUTURE)
SUT FIBERWIRE #2 38 T-5 BLUE (SUTURE)
SUT MNCRL AB 4-0 PS2 18 (SUTURE) IMPLANT
SUT MON AB 2-0 CT1 36 (SUTURE) IMPLANT
SUT VIC AB 0 CT1 27 (SUTURE)
SUT VIC AB 0 CT1 27XBRD ANBCTR (SUTURE) IMPLANT
SUT VIC AB 2-0 SH 27 (SUTURE)
SUT VIC AB 2-0 SH 27XBRD (SUTURE) IMPLANT
SUT VICRYL 3-0 CR8 SH (SUTURE) IMPLANT
SUTURE FIBERWR #2 38 T-5 BLUE (SUTURE) IMPLANT
SUTURE FIBERWR#2 38 REV NDL BL (SUTURE) IMPLANT
SYR BULB 3OZ (MISCELLANEOUS) ×3 IMPLANT
TOWEL OR 17X24 6PK STRL BLUE (TOWEL DISPOSABLE) ×3 IMPLANT
TUBE CONNECTING 12'X1/4 (SUCTIONS) ×1
TUBE CONNECTING 12X1/4 (SUCTIONS) ×2 IMPLANT
UNDERPAD 30X30 (UNDERPADS AND DIAPERS) ×3 IMPLANT
splint ×20 IMPLANT

## 2018-04-24 NOTE — Anesthesia Procedure Notes (Signed)
Anesthesia Regional Block: Supraclavicular block   Pre-Anesthetic Checklist: ,, timeout performed, Correct Patient, Correct Site, Correct Laterality, Correct Procedure, Correct Position, site marked, Risks and benefits discussed,  Surgical consent,  Pre-op evaluation,  At surgeon's request and post-op pain management  Laterality: Left  Prep: chloraprep       Needles:  Injection technique: Single-shot  Needle Type: Stimiplex     Needle Length: 9cm  Needle Gauge: 21     Additional Needles:   Procedures:,,,, ultrasound used (permanent image in chart),,,,  Narrative:  Start time: 04/24/2018 8:11 AM End time: 04/24/2018 8:16 AM Injection made incrementally with aspirations every 5 mL.  Performed by: Personally  Anesthesiologist: Lynda Rainwater, MD

## 2018-04-24 NOTE — Addendum Note (Signed)
Addendum  created 04/24/18 1307 by Wanita Chamberlain, CRNA   Charge Capture section accepted

## 2018-04-24 NOTE — Anesthesia Procedure Notes (Signed)
Procedure Name: MAC Date/Time: 04/24/2018 8:50 AM Performed by: Lynda Rainwater, MD Pre-anesthesia Checklist: Patient identified, Emergency Drugs available, Suction available, Patient being monitored and Timeout performed Oxygen Delivery Method: Simple face mask Placement Confirmation: positive ETCO2 and breath sounds checked- equal and bilateral

## 2018-04-24 NOTE — Discharge Instructions (Signed)
Keep wrist elevated with ice as much as possible to reduce pain and swelling. If needed, you may increase pain medication for the first few days post op to 2 tablets every 4 hours.  Stop as needed pain medication as soon as you are able.  Diet: As you were doing prior to hospitalization   Shower:  You have a splint on, leave the splint in place and keep the splint dry with a plastic bag.  Dressing:  You have a splint - leave the splint in place and we will change your bandages during your first follow-up appointment.    Activity:  Increase activity slowly as tolerated, but follow the weight bearing instructions below.  The rules on driving is that you can not be taking narcotics while you drive, and you must feel in control of the vehicle.    Weight Bearing:  Non weight bearing affected wrist.  Sling for comfort.   To prevent constipation: you may use a stool softener such as -  Colace (over the counter) 100 mg by mouth twice a day  Drink plenty of fluids (prune juice may be helpful) and high fiber foods Miralax (over the counter) for constipation as needed.    Itching:  If you experience itching with your medications, try taking only a single pain pill, or even half a pain pill at a time.  You can also use benadryl over the counter for itching or also to help with sleep.   Precautions:  If you experience chest pain or shortness of breath - call 911 immediately for transfer to the hospital emergency department!!  If you develop a fever greater that 101 F, purulent drainage from wound, increased redness or drainage from wound, or calf pain -- Call the office at [phone]                                         Follow- Up Appointment:  Please call for an appointment to be seen in 2 weeks [place] - [phone]     Regional Anesthesia Blocks  1. Numbness or the inability to move the "blocked" extremity may last from 3-48 hours after placement. The length of time depends on the  medication injected and your individual response to the medication. If the numbness is not going away after 48 hours, call your surgeon.  2. The extremity that is blocked will need to be protected until the numbness is gone and the  Strength has returned. Because you cannot feel it, you will need to take extra care to avoid injury. Because it may be weak, you may have difficulty moving it or using it. You may not know what position it is in without looking at it while the block is in effect.  3. For blocks in the legs and feet, returning to weight bearing and walking needs to be done carefully. You will need to wait until the numbness is entirely gone and the strength has returned. You should be able to move your leg and foot normally before you try and bear weight or walk. You will need someone to be with you when you first try to ensure you do not fall and possibly risk injury.  4. Bruising and tenderness at the needle site are common side effects and will resolve in a few days.  5. Persistent numbness or new problems with movement should be communicated to  the surgeon or the [place] [phone] Laura [phone]).     Post Anesthesia Home Care Instructions  Activity: Get plenty of rest for the remainder of the day. A responsible individual must stay with you for 24 hours following the procedure.  For the next 24 hours, DO NOT: -Drive a car -Paediatric nurse -Drink alcoholic beverages -Take any medication unless instructed by your physician -Make any legal decisions or sign important papers.  Meals: Start with liquid foods such as gelatin or soup. Progress to regular foods as tolerated. Avoid greasy, spicy, heavy foods. If nausea and/or vomiting occur, drink only clear liquids until the nausea and/or vomiting subsides. Call your physician if vomiting continues.  Special Instructions/Symptoms: Your throat may feel dry or sore from the anesthesia  or the breathing tube placed in your throat during surgery. If this causes discomfort, gargle with warm salt water. The discomfort should disappear within 24 hours.  If you had a scopolamine patch placed behind your ear for the management of post- operative nausea and/or vomiting:  1. The medication in the patch is effective for 72 hours, after which it should be removed.  Wrap patch in a tissue and discard in the trash. Wash hands thoroughly with soap and water. 2. You may remove the patch earlier than 72 hours if you experience unpleasant side effects which may include dry mouth, dizziness or visual disturbances. 3. Avoid touching the patch. Wash your hands with soap and water after contact with the patch.

## 2018-04-24 NOTE — Anesthesia Postprocedure Evaluation (Signed)
Anesthesia Post Note  Patient: Erin Vazquez  Procedure(s) Performed: OPEN REDUCTION INTERNAL FIXATION (ORIF) LEFT DISTAL RADIAL FRACTURE (Left Wrist)     Patient location during evaluation: PACU Anesthesia Type: Regional and MAC Level of consciousness: awake and alert Pain management: pain level controlled Vital Signs Assessment: post-procedure vital signs reviewed and stable Respiratory status: spontaneous breathing, nonlabored ventilation and respiratory function stable Cardiovascular status: blood pressure returned to baseline and stable Postop Assessment: no apparent nausea or vomiting Anesthetic complications: no    Last Vitals:  Vitals:   04/24/18 1045 04/24/18 1105  BP: 137/74 (!) 143/76  Pulse: 61 62  Resp: 16 18  Temp:    SpO2: 98% 97%    Last Pain:  Vitals:   04/24/18 1045  TempSrc:   PainSc: 0-No pain                 Lynda Rainwater
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
## 2018-04-24 NOTE — Anesthesia Preprocedure Evaluation (Signed)
Anesthesia Evaluation  Patient identified by MRN, date of birth, ID band Patient awake    Reviewed: Allergy & Precautions, NPO status , Patient's Chart, lab work & pertinent test results  Airway Mallampati: II  TM Distance: >3 FB Neck ROM: Full    Dental no notable dental hx.    Pulmonary neg pulmonary ROS, former smoker,    Pulmonary exam normal breath sounds clear to auscultation       Cardiovascular hypertension, Pt. on medications negative cardio ROS Normal cardiovascular exam Rhythm:Regular Rate:Normal     Neuro/Psych negative neurological ROS  negative psych ROS   GI/Hepatic negative GI ROS, Neg liver ROS,   Endo/Other  Hypothyroidism Morbid obesity  Renal/GU negative Renal ROS  negative genitourinary   Musculoskeletal  (+) Arthritis ,   Abdominal (+) + obese,   Peds negative pediatric ROS (+)  Hematology negative hematology ROS (+)   Anesthesia Other Findings   Reproductive/Obstetrics negative OB ROS                             Anesthesia Physical Anesthesia Plan  ASA: III  Anesthesia Plan: MAC and Regional   Post-op Pain Management:    Induction: Intravenous  PONV Risk Score and Plan: 2 and Ondansetron and Midazolam  Airway Management Planned: Simple Face Mask  Additional Equipment:   Intra-op Plan:   Post-operative Plan:   Informed Consent: I have reviewed the patients History and Physical, chart, labs and discussed the procedure including the risks, benefits and alternatives for the proposed anesthesia with the patient or authorized representative who has indicated his/her understanding and acceptance.   Dental advisory given  Plan Discussed with: CRNA  Anesthesia Plan Comments:         Anesthesia Quick Evaluation

## 2018-04-24 NOTE — Transfer of Care (Signed)
   Last Vitals:  Vitals Value Taken Time  BP 135/68 04/24/2018 10:16 AM  Temp    Pulse 59 04/24/2018 10:21 AM  Resp 14 04/24/2018 10:21 AM  SpO2 98 % 04/24/2018 10:21 AM  Vitals shown include unvalidated device data.  Last Pain:  Vitals:   04/24/18 0725  TempSrc:   PainSc: 4       Patients Stated Pain Goal: 7 (04/24/18 0725)  Immediate Anesthesia Transfer of Care Note  Patient: Erin Vazquez  Procedure(s) Performed: Procedure(s) (LRB): OPEN REDUCTION INTERNAL FIXATION (ORIF) LEFT DISTAL RADIAL FRACTURE (Left)  Patient Location: PACU  Anesthesia Type: General  Level of Consciousness: awake, alert  and oriented  Airway & Oxygen Therapy: Patient Spontanous Breathing and Patient connected to nasal cannula oxygen  Post-op Assessment: Report given to PACU RN and Post -op Vital signs reviewed and stable  Post vital signs: Reviewed and stable  Complications: No apparent anesthesia complications

## 2018-04-25 ENCOUNTER — Encounter (HOSPITAL_BASED_OUTPATIENT_CLINIC_OR_DEPARTMENT_OTHER): Payer: Self-pay | Admitting: Orthopedic Surgery

## 2018-04-26 ENCOUNTER — Other Ambulatory Visit: Payer: Self-pay
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
## 2018-05-01 ENCOUNTER — Other Ambulatory Visit: Payer: Self-pay
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
## 2018-05-07 DIAGNOSIS — S52502D Unspecified fracture of the lower end of left radius, subsequent encounter for closed fracture with routine healing: Secondary | ICD-10-CM | POA: Diagnosis not present

## 2018-05-08 ENCOUNTER — Other Ambulatory Visit: Payer: Self-pay
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
## 2018-05-28 DIAGNOSIS — M25511 Pain in right shoulder: Secondary | ICD-10-CM | POA: Diagnosis not present

## 2018-05-28 DIAGNOSIS — S52502D Unspecified fracture of the lower end of left radius, subsequent encounter for closed fracture with routine healing: Secondary | ICD-10-CM | POA: Diagnosis not present

## 2018-05-29 DIAGNOSIS — M545 Low back pain: Secondary | ICD-10-CM | POA: Diagnosis not present

## 2018-06-11 DIAGNOSIS — S52502D Unspecified fracture of the lower end of left radius, subsequent encounter for closed fracture with routine healing: Secondary | ICD-10-CM | POA: Diagnosis not present

## 2018-06-15 ENCOUNTER — Encounter (HOSPITAL_COMMUNITY): Payer: Self-pay

## 2018-06-15 NOTE — Therapy (Signed)
[place], [place], [address] Phone: [phone]   Fax:  [phone]  Patient Details  Name: Erin Vazquez MRN: [id number] Date of Birth: Oct 20, 1940 Referring Provider:  No ref. provider found  Encounter Date: 06/15/2018  PHYSICAL THERAPY DISCHARGE SUMMARY  Visits from Start of Care: 12  Current functional level related to goals / functional outcomes: See last note   Remaining deficits: See last note   Education / Equipment: n/a  Plan: Patient agrees to discharge.  Patient goals were not met. Patient is being discharged due to not returning since the last visit.  ?????     Geraldine Solar PT, [place] [address] [place], [place], [address] Phone: [phone]   Fax:  [phone]

## 2018-06-21 DIAGNOSIS — M25532 Pain in left wrist: Secondary | ICD-10-CM | POA: Diagnosis not present

## 2018-06-21 DIAGNOSIS — M25632 Stiffness of left wrist, not elsewhere classified: Secondary | ICD-10-CM | POA: Diagnosis not present

## 2018-06-21 DIAGNOSIS — M25642 Stiffness of left hand, not elsewhere classified: Secondary | ICD-10-CM | POA: Diagnosis not present

## 2018-06-21 DIAGNOSIS — M25542 Pain in joints of left hand: Secondary | ICD-10-CM | POA: Diagnosis not present

## 2018-06-25 DIAGNOSIS — M25532 Pain in left wrist: Secondary | ICD-10-CM | POA: Diagnosis not present

## 2018-06-25 DIAGNOSIS — M25632 Stiffness of left wrist, not elsewhere classified: Secondary | ICD-10-CM | POA: Diagnosis not present

## 2018-06-25 DIAGNOSIS — M25542 Pain in joints of left hand: Secondary | ICD-10-CM | POA: Diagnosis not present

## 2018-06-25 DIAGNOSIS — M25642 Stiffness of left hand, not elsewhere classified: Secondary | ICD-10-CM | POA: Diagnosis not present

## 2018-06-28 DIAGNOSIS — M25532 Pain in left wrist: Secondary | ICD-10-CM | POA: Diagnosis not present

## 2018-06-28 DIAGNOSIS — M25542 Pain in joints of left hand: Secondary | ICD-10-CM | POA: Diagnosis not present

## 2018-06-28 DIAGNOSIS — M25632 Stiffness of left wrist, not elsewhere classified: Secondary | ICD-10-CM | POA: Diagnosis not present

## 2018-06-28 DIAGNOSIS — M25642 Stiffness of left hand, not elsewhere classified: Secondary | ICD-10-CM | POA: Diagnosis not present

## 2018-07-02 DIAGNOSIS — M25542 Pain in joints of left hand: Secondary | ICD-10-CM | POA: Diagnosis not present

## 2018-07-02 DIAGNOSIS — M25642 Stiffness of left hand, not elsewhere classified: Secondary | ICD-10-CM | POA: Diagnosis not present

## 2018-07-02 DIAGNOSIS — M25632 Stiffness of left wrist, not elsewhere classified: Secondary | ICD-10-CM | POA: Diagnosis not present

## 2018-07-02 DIAGNOSIS — M25532 Pain in left wrist: Secondary | ICD-10-CM | POA: Diagnosis not present

## 2018-07-05 DIAGNOSIS — M25632 Stiffness of left wrist, not elsewhere classified: Secondary | ICD-10-CM | POA: Diagnosis not present

## 2018-07-05 DIAGNOSIS — M25542 Pain in joints of left hand: Secondary | ICD-10-CM | POA: Diagnosis not present

## 2018-07-05 DIAGNOSIS — M25532 Pain in left wrist: Secondary | ICD-10-CM | POA: Diagnosis not present

## 2018-07-05 DIAGNOSIS — M25642 Stiffness of left hand, not elsewhere classified: Secondary | ICD-10-CM | POA: Diagnosis not present

## 2018-07-10 DIAGNOSIS — M25642 Stiffness of left hand, not elsewhere classified: Secondary | ICD-10-CM | POA: Diagnosis not present

## 2018-07-10 DIAGNOSIS — M545 Low back pain: Secondary | ICD-10-CM | POA: Diagnosis not present

## 2018-07-10 DIAGNOSIS — M25532 Pain in left wrist: Secondary | ICD-10-CM | POA: Diagnosis not present

## 2018-07-10 DIAGNOSIS — M25542 Pain in joints of left hand: Secondary | ICD-10-CM | POA: Diagnosis not present

## 2018-07-10 DIAGNOSIS — M25551 Pain in right hip: Secondary | ICD-10-CM | POA: Diagnosis not present

## 2018-07-10 DIAGNOSIS — M25632 Stiffness of left wrist, not elsewhere classified: Secondary | ICD-10-CM | POA: Diagnosis not present

## 2018-07-10 DIAGNOSIS — M25552 Pain in left hip: Secondary | ICD-10-CM | POA: Diagnosis not present

## 2018-07-10 DIAGNOSIS — M5416 Radiculopathy, lumbar region: Secondary | ICD-10-CM | POA: Diagnosis not present

## 2018-07-13 DIAGNOSIS — M25642 Stiffness of left hand, not elsewhere classified: Secondary | ICD-10-CM | POA: Diagnosis not present

## 2018-07-13 DIAGNOSIS — M25632 Stiffness of left wrist, not elsewhere classified: Secondary | ICD-10-CM | POA: Diagnosis not present

## 2018-07-13 DIAGNOSIS — M25542 Pain in joints of left hand: Secondary | ICD-10-CM | POA: Diagnosis not present

## 2018-07-13 DIAGNOSIS — M25532 Pain in left wrist: Secondary | ICD-10-CM | POA: Diagnosis not present

## 2018-07-16 DIAGNOSIS — M25552 Pain in left hip: Secondary | ICD-10-CM | POA: Diagnosis not present

## 2018-07-16 DIAGNOSIS — M25632 Stiffness of left wrist, not elsewhere classified: Secondary | ICD-10-CM | POA: Diagnosis not present

## 2018-07-16 DIAGNOSIS — M25532 Pain in left wrist: Secondary | ICD-10-CM | POA: Diagnosis not present

## 2018-07-16 DIAGNOSIS — M545 Low back pain: Secondary | ICD-10-CM | POA: Diagnosis not present

## 2018-07-16 DIAGNOSIS — M25642 Stiffness of left hand, not elsewhere classified: Secondary | ICD-10-CM | POA: Diagnosis not present

## 2018-07-16 DIAGNOSIS — M25542 Pain in joints of left hand: Secondary | ICD-10-CM | POA: Diagnosis not present

## 2018-07-16 DIAGNOSIS — M5416 Radiculopathy, lumbar region: Secondary | ICD-10-CM | POA: Diagnosis not present

## 2018-07-16 DIAGNOSIS — M25551 Pain in right hip: Secondary | ICD-10-CM | POA: Diagnosis not present

## 2018-07-19 DIAGNOSIS — M25642 Stiffness of left hand, not elsewhere classified: Secondary | ICD-10-CM | POA: Diagnosis not present

## 2018-07-19 DIAGNOSIS — M25632 Stiffness of left wrist, not elsewhere classified: Secondary | ICD-10-CM | POA: Diagnosis not present

## 2018-07-19 DIAGNOSIS — M5416 Radiculopathy, lumbar region: Secondary | ICD-10-CM | POA: Diagnosis not present

## 2018-07-19 DIAGNOSIS — M25542 Pain in joints of left hand: Secondary | ICD-10-CM | POA: Diagnosis not present

## 2018-07-19 DIAGNOSIS — M25551 Pain in right hip: Secondary | ICD-10-CM | POA: Diagnosis not present

## 2018-07-19 DIAGNOSIS — M25552 Pain in left hip: Secondary | ICD-10-CM | POA: Diagnosis not present

## 2018-07-19 DIAGNOSIS — M25532 Pain in left wrist: Secondary | ICD-10-CM | POA: Diagnosis not present

## 2018-07-19 DIAGNOSIS — M545 Low back pain: Secondary | ICD-10-CM | POA: Diagnosis not present

## 2018-07-23 DIAGNOSIS — M25532 Pain in left wrist: Secondary | ICD-10-CM | POA: Diagnosis not present

## 2018-07-23 DIAGNOSIS — M545 Low back pain: Secondary | ICD-10-CM | POA: Diagnosis not present

## 2018-07-23 DIAGNOSIS — S52502D Unspecified fracture of the lower end of left radius, subsequent encounter for closed fracture with routine healing: Secondary | ICD-10-CM | POA: Diagnosis not present

## 2018-07-23 DIAGNOSIS — M25551 Pain in right hip: Secondary | ICD-10-CM | POA: Diagnosis not present

## 2018-07-23 DIAGNOSIS — M25542 Pain in joints of left hand: Secondary | ICD-10-CM | POA: Diagnosis not present

## 2018-07-23 DIAGNOSIS — M5416 Radiculopathy, lumbar region: Secondary | ICD-10-CM | POA: Diagnosis not present

## 2018-07-23 DIAGNOSIS — M25552 Pain in left hip: Secondary | ICD-10-CM | POA: Diagnosis not present

## 2018-07-23 DIAGNOSIS — M25642 Stiffness of left hand, not elsewhere classified: Secondary | ICD-10-CM | POA: Diagnosis not present

## 2018-07-23 DIAGNOSIS — M25632 Stiffness of left wrist, not elsewhere classified: Secondary | ICD-10-CM | POA: Diagnosis not present

## 2018-07-25 DIAGNOSIS — M545 Low back pain: Secondary | ICD-10-CM | POA: Diagnosis not present

## 2018-07-25 DIAGNOSIS — M25542 Pain in joints of left hand: Secondary | ICD-10-CM | POA: Diagnosis not present

## 2018-07-25 DIAGNOSIS — M25552 Pain in left hip: Secondary | ICD-10-CM | POA: Diagnosis not present

## 2018-07-25 DIAGNOSIS — M25642 Stiffness of left hand, not elsewhere classified: Secondary | ICD-10-CM | POA: Diagnosis not present

## 2018-07-25 DIAGNOSIS — M25551 Pain in right hip: Secondary | ICD-10-CM | POA: Diagnosis not present

## 2018-07-25 DIAGNOSIS — M25632 Stiffness of left wrist, not elsewhere classified: Secondary | ICD-10-CM | POA: Diagnosis not present

## 2018-07-25 DIAGNOSIS — M5416 Radiculopathy, lumbar region: Secondary | ICD-10-CM | POA: Diagnosis not present

## 2018-07-25 DIAGNOSIS — M25532 Pain in left wrist: Secondary | ICD-10-CM | POA: Diagnosis not present

## 2018-07-30 DIAGNOSIS — M5416 Radiculopathy, lumbar region: Secondary | ICD-10-CM | POA: Diagnosis not present

## 2018-07-30 DIAGNOSIS — M25632 Stiffness of left wrist, not elsewhere classified: Secondary | ICD-10-CM | POA: Diagnosis not present

## 2018-07-30 DIAGNOSIS — M545 Low back pain: Secondary | ICD-10-CM | POA: Diagnosis not present

## 2018-07-30 DIAGNOSIS — M25642 Stiffness of left hand, not elsewhere classified: Secondary | ICD-10-CM | POA: Diagnosis not present

## 2018-07-30 DIAGNOSIS — M25532 Pain in left wrist: Secondary | ICD-10-CM | POA: Diagnosis not present

## 2018-07-30 DIAGNOSIS — M25542 Pain in joints of left hand: Secondary | ICD-10-CM | POA: Diagnosis not present

## 2018-07-30 DIAGNOSIS — M25551 Pain in right hip: Secondary | ICD-10-CM | POA: Diagnosis not present

## 2018-07-30 DIAGNOSIS — M25552 Pain in left hip: Secondary | ICD-10-CM | POA: Diagnosis not present

## 2018-08-03 DIAGNOSIS — M25642 Stiffness of left hand, not elsewhere classified: Secondary | ICD-10-CM | POA: Diagnosis not present

## 2018-08-03 DIAGNOSIS — M25532 Pain in left wrist: Secondary | ICD-10-CM | POA: Diagnosis not present

## 2018-08-03 DIAGNOSIS — M545 Low back pain: Secondary | ICD-10-CM | POA: Diagnosis not present

## 2018-08-03 DIAGNOSIS — M25552 Pain in left hip: Secondary | ICD-10-CM | POA: Diagnosis not present

## 2018-08-03 DIAGNOSIS — M5416 Radiculopathy, lumbar region: Secondary | ICD-10-CM | POA: Diagnosis not present

## 2018-08-03 DIAGNOSIS — M25542 Pain in joints of left hand: Secondary | ICD-10-CM | POA: Diagnosis not present

## 2018-08-03 DIAGNOSIS — R202 Paresthesia of skin: Secondary | ICD-10-CM | POA: Diagnosis not present

## 2018-08-03 DIAGNOSIS — M6281 Muscle weakness (generalized): Secondary | ICD-10-CM | POA: Diagnosis not present

## 2018-08-03 DIAGNOSIS — M25632 Stiffness of left wrist, not elsewhere classified: Secondary | ICD-10-CM | POA: Diagnosis not present

## 2018-08-03 DIAGNOSIS — S52502S Unspecified fracture of the lower end of left radius, sequela: Secondary | ICD-10-CM | POA: Diagnosis not present

## 2018-08-03 DIAGNOSIS — M25551 Pain in right hip: Secondary | ICD-10-CM | POA: Diagnosis not present

## 2018-08-08 DIAGNOSIS — M5416 Radiculopathy, lumbar region: Secondary | ICD-10-CM | POA: Diagnosis not present

## 2018-08-08 DIAGNOSIS — M25551 Pain in right hip: Secondary | ICD-10-CM | POA: Diagnosis not present

## 2018-08-08 DIAGNOSIS — M545 Low back pain: Secondary | ICD-10-CM | POA: Diagnosis not present

## 2018-08-08 DIAGNOSIS — M25552 Pain in left hip: Secondary | ICD-10-CM | POA: Diagnosis not present

## 2018-08-10 DIAGNOSIS — M25551 Pain in right hip: Secondary | ICD-10-CM | POA: Diagnosis not present

## 2018-08-10 DIAGNOSIS — M25552 Pain in left hip: Secondary | ICD-10-CM | POA: Diagnosis not present

## 2018-08-10 DIAGNOSIS — M5416 Radiculopathy, lumbar region: Secondary | ICD-10-CM | POA: Diagnosis not present

## 2018-08-10 DIAGNOSIS — M545 Low back pain: Secondary | ICD-10-CM | POA: Diagnosis not present

## 2018-08-13 DIAGNOSIS — M25551 Pain in right hip: Secondary | ICD-10-CM | POA: Diagnosis not present

## 2018-08-13 DIAGNOSIS — M25552 Pain in left hip: Secondary | ICD-10-CM | POA: Diagnosis not present

## 2018-08-13 DIAGNOSIS — M545 Low back pain: Secondary | ICD-10-CM | POA: Diagnosis not present

## 2018-08-13 DIAGNOSIS — M5416 Radiculopathy, lumbar region: Secondary | ICD-10-CM | POA: Diagnosis not present

## 2018-08-15 DIAGNOSIS — M545 Low back pain: Secondary | ICD-10-CM | POA: Diagnosis not present

## 2018-08-15 DIAGNOSIS — M5416 Radiculopathy, lumbar region: Secondary | ICD-10-CM | POA: Diagnosis not present

## 2018-08-15 DIAGNOSIS — M25552 Pain in left hip: Secondary | ICD-10-CM | POA: Diagnosis not present

## 2018-08-15 DIAGNOSIS — M25551 Pain in right hip: Secondary | ICD-10-CM | POA: Diagnosis not present

## 2018-08-20 DIAGNOSIS — M5416 Radiculopathy, lumbar region: Secondary | ICD-10-CM | POA: Diagnosis not present

## 2018-08-20 DIAGNOSIS — M25552 Pain in left hip: Secondary | ICD-10-CM | POA: Diagnosis not present

## 2018-08-20 DIAGNOSIS — M545 Low back pain: Secondary | ICD-10-CM | POA: Diagnosis not present

## 2018-08-20 DIAGNOSIS — M25551 Pain in right hip: Secondary | ICD-10-CM | POA: Diagnosis not present

## 2018-08-23 DIAGNOSIS — M25552 Pain in left hip: Secondary | ICD-10-CM | POA: Diagnosis not present

## 2018-08-23 DIAGNOSIS — M25551 Pain in right hip: Secondary | ICD-10-CM | POA: Diagnosis not present

## 2018-08-23 DIAGNOSIS — M545 Low back pain: Secondary | ICD-10-CM | POA: Diagnosis not present

## 2018-08-23 DIAGNOSIS — M5416 Radiculopathy, lumbar region: Secondary | ICD-10-CM | POA: Diagnosis not present

## 2018-08-24 ENCOUNTER — Other Ambulatory Visit: Payer: Self-pay | Admitting: Orthopedic Surgery

## 2018-08-24 DIAGNOSIS — S52502D Unspecified fracture of the lower end of left radius, subsequent encounter for closed fracture with routine healing: Secondary | ICD-10-CM | POA: Diagnosis not present

## 2018-08-24 DIAGNOSIS — S62009B Unspecified fracture of navicular [scaphoid] bone of unspecified wrist, initial encounter for open fracture: Secondary | ICD-10-CM

## 2018-08-29 DIAGNOSIS — M25552 Pain in left hip: Secondary | ICD-10-CM | POA: Diagnosis not present

## 2018-08-29 DIAGNOSIS — M5416 Radiculopathy, lumbar region: Secondary | ICD-10-CM | POA: Diagnosis not present

## 2018-08-29 DIAGNOSIS — M545 Low back pain: Secondary | ICD-10-CM | POA: Diagnosis not present

## 2018-08-29 DIAGNOSIS — M25551 Pain in right hip: Secondary | ICD-10-CM | POA: Diagnosis not present

## 2018-08-31 ENCOUNTER — Ambulatory Visit
Admission: RE | Admit: 2018-08-31 | Discharge: 2018-08-31 | Disposition: A | Discharge status: Home / Self Care | Payer: PPO | Source: Ambulatory Visit | Attending: Orthopedic Surgery | Admitting: Orthopedic Surgery

## 2018-08-31 ENCOUNTER — Other Ambulatory Visit: Payer: Self-pay

## 2018-08-31 DIAGNOSIS — M25552 Pain in left hip: Secondary | ICD-10-CM | POA: Diagnosis not present

## 2018-08-31 DIAGNOSIS — M545 Low back pain: Secondary | ICD-10-CM | POA: Diagnosis not present

## 2018-08-31 DIAGNOSIS — S62009B Unspecified fracture of navicular [scaphoid] bone of unspecified wrist, initial encounter for open fracture: Secondary | ICD-10-CM | POA: Diagnosis not present

## 2018-08-31 DIAGNOSIS — M5416 Radiculopathy, lumbar region: Secondary | ICD-10-CM | POA: Diagnosis not present

## 2018-08-31 DIAGNOSIS — M25551 Pain in right hip: Secondary | ICD-10-CM | POA: Diagnosis not present

## 2018-09-06 DIAGNOSIS — M25551 Pain in right hip: Secondary | ICD-10-CM | POA: Diagnosis not present

## 2018-09-06 DIAGNOSIS — M545 Low back pain: Secondary | ICD-10-CM | POA: Diagnosis not present

## 2018-09-06 DIAGNOSIS — M5416 Radiculopathy, lumbar region: Secondary | ICD-10-CM | POA: Diagnosis not present

## 2018-09-06 DIAGNOSIS — M25552 Pain in left hip: Secondary | ICD-10-CM | POA: Diagnosis not present

## 2018-09-10 DIAGNOSIS — M5416 Radiculopathy, lumbar region: Secondary | ICD-10-CM | POA: Diagnosis not present

## 2018-09-10 DIAGNOSIS — M25551 Pain in right hip: Secondary | ICD-10-CM | POA: Diagnosis not present

## 2018-09-10 DIAGNOSIS — M25552 Pain in left hip: Secondary | ICD-10-CM | POA: Diagnosis not present

## 2018-09-10 DIAGNOSIS — M545 Low back pain: Secondary | ICD-10-CM | POA: Diagnosis not present

## 2018-09-19 DIAGNOSIS — R918 Other nonspecific abnormal finding of lung field: Secondary | ICD-10-CM | POA: Diagnosis not present

## 2018-09-19 DIAGNOSIS — M25531 Pain in right wrist: Secondary | ICD-10-CM | POA: Diagnosis not present

## 2018-09-19 DIAGNOSIS — I509 Heart failure, unspecified: Secondary | ICD-10-CM | POA: Diagnosis not present

## 2018-09-19 DIAGNOSIS — I700 Atherosclerosis of aorta: Secondary | ICD-10-CM | POA: Diagnosis not present

## 2018-09-19 DIAGNOSIS — Z7901 Long term (current) use of anticoagulants: Secondary | ICD-10-CM | POA: Diagnosis not present

## 2018-09-19 DIAGNOSIS — Z23 Encounter for immunization: Secondary | ICD-10-CM | POA: Diagnosis not present

## 2018-09-19 DIAGNOSIS — S52502D Unspecified fracture of the lower end of left radius, subsequent encounter for closed fracture with routine healing: Secondary | ICD-10-CM | POA: Diagnosis not present

## 2018-09-19 DIAGNOSIS — Z9181 History of falling: Secondary | ICD-10-CM | POA: Diagnosis not present

## 2018-09-19 DIAGNOSIS — E119 Type 2 diabetes mellitus without complications: Secondary | ICD-10-CM | POA: Diagnosis not present

## 2018-09-19 DIAGNOSIS — N183 Chronic kidney disease, stage 3 unspecified: Secondary | ICD-10-CM | POA: Diagnosis not present

## 2018-09-19 DIAGNOSIS — N3010 Interstitial cystitis (chronic) without hematuria: Secondary | ICD-10-CM | POA: Diagnosis not present

## 2018-09-19 DIAGNOSIS — Z87891 Personal history of nicotine dependence: Secondary | ICD-10-CM | POA: Diagnosis not present

## 2018-09-19 DIAGNOSIS — I130 Hypertensive heart and chronic kidney disease with heart failure and stage 1 through stage 4 chronic kidney disease, or unspecified chronic kidney disease: Secondary | ICD-10-CM | POA: Diagnosis not present

## 2018-09-19 DIAGNOSIS — I4892 Unspecified atrial flutter: Secondary | ICD-10-CM | POA: Diagnosis not present

## 2018-09-19 DIAGNOSIS — I4819 Other persistent atrial fibrillation: Secondary | ICD-10-CM | POA: Diagnosis not present

## 2018-09-19 DIAGNOSIS — I2510 Atherosclerotic heart disease of native coronary artery without angina pectoris: Secondary | ICD-10-CM | POA: Diagnosis not present

## 2018-09-19 DIAGNOSIS — I10 Essential (primary) hypertension: Secondary | ICD-10-CM | POA: Diagnosis not present

## 2018-09-19 DIAGNOSIS — E785 Hyperlipidemia, unspecified: Secondary | ICD-10-CM | POA: Diagnosis not present

## 2018-09-19 DIAGNOSIS — M1990 Unspecified osteoarthritis, unspecified site: Secondary | ICD-10-CM | POA: Diagnosis not present

## 2018-09-19 DIAGNOSIS — J439 Emphysema, unspecified: Secondary | ICD-10-CM | POA: Diagnosis not present

## 2018-09-19 DIAGNOSIS — Z8701 Personal history of pneumonia (recurrent): Secondary | ICD-10-CM | POA: Diagnosis not present

## 2018-09-19 DIAGNOSIS — G2581 Restless legs syndrome: Secondary | ICD-10-CM | POA: Diagnosis not present

## 2018-09-19 DIAGNOSIS — Z8619 Personal history of other infectious and parasitic diseases: Secondary | ICD-10-CM | POA: Diagnosis not present

## 2018-09-19 DIAGNOSIS — I252 Old myocardial infarction: Secondary | ICD-10-CM | POA: Diagnosis not present

## 2018-10-03 DIAGNOSIS — M654 Radial styloid tenosynovitis [de Quervain]: Secondary | ICD-10-CM | POA: Diagnosis not present

## 2018-10-17 DIAGNOSIS — M5416 Radiculopathy, lumbar region: Secondary | ICD-10-CM | POA: Diagnosis not present

## 2018-10-17 DIAGNOSIS — M545 Low back pain: Secondary | ICD-10-CM | POA: Diagnosis not present

## 2018-10-17 DIAGNOSIS — M25551 Pain in right hip: Secondary | ICD-10-CM | POA: Diagnosis not present

## 2018-10-17 DIAGNOSIS — M25552 Pain in left hip: Secondary | ICD-10-CM | POA: Diagnosis not present

## 2018-10-19 DIAGNOSIS — M545 Low back pain: Secondary | ICD-10-CM | POA: Diagnosis not present

## 2018-10-19 DIAGNOSIS — M5416 Radiculopathy, lumbar region: Secondary | ICD-10-CM | POA: Diagnosis not present

## 2018-10-19 DIAGNOSIS — M25551 Pain in right hip: Secondary | ICD-10-CM | POA: Diagnosis not present

## 2018-10-19 DIAGNOSIS — M25552 Pain in left hip: Secondary | ICD-10-CM | POA: Diagnosis not present

## 2018-10-23 DIAGNOSIS — M25552 Pain in left hip: Secondary | ICD-10-CM | POA: Diagnosis not present

## 2018-10-23 DIAGNOSIS — M545 Low back pain: Secondary | ICD-10-CM | POA: Diagnosis not present

## 2018-10-23 DIAGNOSIS — M5416 Radiculopathy, lumbar region: Secondary | ICD-10-CM | POA: Diagnosis not present

## 2018-10-23 DIAGNOSIS — M25551 Pain in right hip: Secondary | ICD-10-CM | POA: Diagnosis not present

## 2018-10-29 DIAGNOSIS — M5416 Radiculopathy, lumbar region: Secondary | ICD-10-CM | POA: Diagnosis not present

## 2018-10-29 DIAGNOSIS — M25551 Pain in right hip: Secondary | ICD-10-CM | POA: Diagnosis not present

## 2018-10-29 DIAGNOSIS — M545 Low back pain: Secondary | ICD-10-CM | POA: Diagnosis not present

## 2018-10-29 DIAGNOSIS — M25552 Pain in left hip: Secondary | ICD-10-CM | POA: Diagnosis not present

## 2018-11-02 DIAGNOSIS — M5416 Radiculopathy, lumbar region: Secondary | ICD-10-CM | POA: Diagnosis not present

## 2018-11-02 DIAGNOSIS — M545 Low back pain: Secondary | ICD-10-CM | POA: Diagnosis not present

## 2018-11-02 DIAGNOSIS — M25551 Pain in right hip: Secondary | ICD-10-CM | POA: Diagnosis not present

## 2018-11-02 DIAGNOSIS — M25552 Pain in left hip: Secondary | ICD-10-CM | POA: Diagnosis not present

## 2018-11-07 DIAGNOSIS — M545 Low back pain: Secondary | ICD-10-CM | POA: Diagnosis not present

## 2018-11-07 DIAGNOSIS — M25551 Pain in right hip: Secondary | ICD-10-CM | POA: Diagnosis not present

## 2018-11-07 DIAGNOSIS — M5416 Radiculopathy, lumbar region: Secondary | ICD-10-CM | POA: Diagnosis not present

## 2018-11-07 DIAGNOSIS — M25552 Pain in left hip: Secondary | ICD-10-CM | POA: Diagnosis not present

## 2018-11-09 DIAGNOSIS — M5416 Radiculopathy, lumbar region: Secondary | ICD-10-CM | POA: Diagnosis not present

## 2018-11-09 DIAGNOSIS — M25551 Pain in right hip: Secondary | ICD-10-CM | POA: Diagnosis not present

## 2018-11-09 DIAGNOSIS — M545 Low back pain: Secondary | ICD-10-CM | POA: Diagnosis not present

## 2018-11-09 DIAGNOSIS — M25552 Pain in left hip: Secondary | ICD-10-CM | POA: Diagnosis not present

## 2018-11-13 DIAGNOSIS — M25552 Pain in left hip: Secondary | ICD-10-CM | POA: Diagnosis not present

## 2018-11-13 DIAGNOSIS — M25551 Pain in right hip: Secondary | ICD-10-CM | POA: Diagnosis not present

## 2018-11-14 DIAGNOSIS — E038 Other specified hypothyroidism: Secondary | ICD-10-CM | POA: Diagnosis not present

## 2018-11-14 DIAGNOSIS — I10 Essential (primary) hypertension: Secondary | ICD-10-CM | POA: Diagnosis not present

## 2018-11-14 DIAGNOSIS — K8080 Other cholelithiasis without obstruction: Secondary | ICD-10-CM | POA: Diagnosis not present

## 2018-11-14 DIAGNOSIS — M25539 Pain in unspecified wrist: Secondary | ICD-10-CM | POA: Diagnosis not present

## 2018-11-14 DIAGNOSIS — R7303 Prediabetes: Secondary | ICD-10-CM | POA: Diagnosis not present

## 2018-11-15 DIAGNOSIS — M25512 Pain in left shoulder: Secondary | ICD-10-CM | POA: Diagnosis not present

## 2018-11-15 DIAGNOSIS — T84498A Other mechanical complication of other internal orthopedic devices, implants and grafts, initial encounter: Secondary | ICD-10-CM | POA: Diagnosis not present

## 2018-11-15 DIAGNOSIS — T8484XA Pain due to internal orthopedic prosthetic devices, implants and grafts, initial encounter: Secondary | ICD-10-CM | POA: Diagnosis not present

## 2018-11-15 DIAGNOSIS — M654 Radial styloid tenosynovitis [de Quervain]: Secondary | ICD-10-CM | POA: Diagnosis not present

## 2018-11-28 ENCOUNTER — Encounter: Payer: Self-pay | Admitting: Gastroenterology

## 2018-11-30 DIAGNOSIS — M654 Radial styloid tenosynovitis [de Quervain]: Secondary | ICD-10-CM | POA: Diagnosis not present

## 2018-12-10 DIAGNOSIS — K7689 Other specified diseases of liver: Secondary | ICD-10-CM | POA: Diagnosis not present

## 2018-12-10 DIAGNOSIS — K8020 Calculus of gallbladder without cholecystitis without obstruction: Secondary | ICD-10-CM | POA: Diagnosis not present

## 2018-12-13 DIAGNOSIS — E038 Other specified hypothyroidism: Secondary | ICD-10-CM | POA: Diagnosis not present

## 2018-12-13 DIAGNOSIS — I10 Essential (primary) hypertension: Secondary | ICD-10-CM | POA: Diagnosis not present

## 2018-12-13 DIAGNOSIS — R7303 Prediabetes: Secondary | ICD-10-CM | POA: Diagnosis not present

## 2018-12-13 DIAGNOSIS — K8080 Other cholelithiasis without obstruction: Secondary | ICD-10-CM | POA: Diagnosis not present

## 2018-12-17 DIAGNOSIS — M25551 Pain in right hip: Secondary | ICD-10-CM | POA: Diagnosis not present

## 2018-12-17 DIAGNOSIS — M25552 Pain in left hip: Secondary | ICD-10-CM | POA: Diagnosis not present

## 2018-12-17 NOTE — Progress Notes (Signed)
TELEHEALTH VISIT  Referring Provider: Nolene Ebbs, MD Primary Care Physician:  Nolene Ebbs, MD   Tele-visit due to COVID-19 pandemic Patient requested visit virtually, consented to the virtual encounter via video enabled telemedicine application (telephone as the patient does not have a smartphone or computer with a video capacity) Contact made at: 14:24 12/18/18 Patient verified by name and date of birth Location of patient: Home Location provider: [place] [facility] Names of persons participating: Me, patient, Tinnie Gens CMA Time spent on telehealth visit: 26 minutes I discussed the limitations of evaluation and management by telemedicine. The patient expressed understanding and agreed to proceed.  Reason for Consultation:  Gas, colon cancer screening   IMPRESSION:  Need for colon cancer screening    - Colonoscopy in [place] in 2004 Recent gas and bloating, now improved    - normal abdominal ultrasound through [place] 12/10/18    - records in [place] show similar symptoms in 2017 with an ultrasound evaluation at that time Family history of pancreatitis (brother) and pancreatic cancer (father at age 11) Two small liver cysts seen on ultrasound Cholelithiasis without cholecystitis No known family history of colon cancer or polyps  PLAN: Colonoscopy  I consented the patient discussing the risks, benefits, and alternatives to endoscopic evaluation. In particular, we discussed the risks that include, but are not limited to, reaction to medication, cardiopulmonary compromise, bleeding requiring blood transfusion, aspiration resulting in pneumonia, perforation requiring surgery, lack of diagnosis, severe illness requiring hospitalization, and even death. We reviewed the risk of missed lesion including polyps or even cancer. The patient acknowledges these risks and asks that we proceed.   HPI: Erin Vazquez is a 78 y.o. female who works as a Quarry manager and therapeutic foster  parent who is referred by Dr. Jeanie Cooks for colon cancer screening.  The history is obtained to the patient, review of her electronic health record, and review of her referral records.  She has hypertension, hypothyroidism, venous insufficiency, prediabetes, and morbid obesity.  She has a history of cholelithiasis.  Brother with Crohn's disease died earlier this year due to severe gallstones pancreatitis. Her father died of pancreatic cancer at age 39.  No known family history of colon cancer or polyps. No other family history of uterine/endometrial cancer, pancreatic cancer or gastric/stomach cancer.  She had a colonoscopy and upper endoscopy in [place] in 2004. Path results in [place] show normal colon biopsies (large benign lymphoid aggregate), normal duodenal biopsies, mild chronic gastritis without H pylori, and reflux esophagitis.    With the recent problems her family's been having she has been motivated to take good care of herself.  She would like to have a colonoscopy for colon cancer screening.  She had abdominal ultrasound through [facility] 12/10/2018 to evaluate for significant gas that showed small liver cysts and cholelithiasis without acute cholecystitis. No identified food triggers.  Recently feeling better. Appetite good. Weight stable.   There is no dysphagia, odynophagia, regurgitation,  heartburn, nausea, abdominal pain, change in bowel habits, melena, hematochezia, or bright red blood per rectum. There is no anorexia or recent change in weight.   Labs from 04/24/18: hgb 12.9 Abdominal ultrasound 01/12/16 for belching and flatulence: gallstones, 1.3 cm left lobe hepatic cyst, suspected fatty liver  Past Medical History:  Diagnosis Date  . Arthritis   . Eczema   . Fibrocystic breast    Left  . Gallstones   . Genital herpes   . History of cardiomegaly   . History of iron deficiency  anemia   . HTN (hypertension)   . Hypothyroid   . Numbness    tips of fingers and  toes  . Ovarian cyst    pt unaware  . Pre-diabetes   . Seasonal allergies   . Sigmoid diverticulosis   . Venous insufficiency     Past Surgical History:  Procedure Laterality Date  . BREAST RECONSTRUCTION  1976  . BREAST SURGERY Left 1961   Removed 3 fibroadenoma   . CERVICAL CONE BIOPSY  1981  . KNEE ARTHROSCOPY Left 2010 Dr. Aline Brochure  . MASTECTOMY, PARTIAL Left 1979   Subtotal  . OPEN REDUCTION INTERNAL FIXATION (ORIF) DISTAL RADIAL FRACTURE Left 04/24/2018   Procedure: OPEN REDUCTION INTERNAL FIXATION (ORIF) LEFT DISTAL RADIAL FRACTURE;  Surgeon: Renette Butters, MD;  Location: [place];  Service: Orthopedics;  Laterality: Left;  . SHOULDER SURGERY Right 1976, 11/2017   lipoma excision  . TUBAL LIGATION      Current Outpatient Medications  Medication Sig Dispense Refill  . BIOTIN PO Take by mouth daily.    . Cholecalciferol (VITAMIN D-3 PO) Take 5,000 Units by mouth daily.    . diclofenac sodium (VOLTAREN) 1 % GEL Apply 2 g topically 4 (four) times daily. Rub into affected area of foot 2 to 4 times daily 100 g 2  . docusate sodium (COLACE) 100 MG capsule Take 1 capsule (100 mg total) by mouth 2 (two) times daily. To prevent constipation while taking pain medication. 60 capsule 0  . ferrous sulfate 325 (65 FE) MG tablet Take 325 mg by mouth daily with breakfast.    . gabapentin (NEURONTIN) 100 MG capsule Take 1 capsule (100 mg total) by mouth 3 (three) times daily for 14 days. For pain. 42 capsule 0  . hydrochlorothiazide (MICROZIDE) 12.5 MG capsule Take 12.5 mg by mouth daily.    Marland Kitchen ibuprofen (ADVIL,MOTRIN) 800 MG tablet Take 800 mg by mouth every other day.    . levothyroxine (SYNTHROID, LEVOTHROID) 75 MCG tablet Take 75 mcg by mouth daily.      Marland Kitchen losartan-hydrochlorothiazide (HYZAAR) 50-12.5 MG per tablet Take 1 tablet by mouth daily.    . metroNIDAZOLE (METROGEL) 0.75 % vaginal gel Place 1 Applicatorful vaginally at bedtime as needed and may repeat dose  one time if needed. Apply one applicatorful to vagina at bedtime for 5 days 70 g 1  . ondansetron (ZOFRAN) 4 MG tablet Take 1 tablet (4 mg total) by mouth every 8 (eight) hours as needed for nausea or vomiting. 20 tablet 0  . OVER THE COUNTER MEDICATION Digestion system supplement    . UNABLE TO FIND Tumeric-1 daily; Hawthorne UUVOZ-3-6 tabs daily; Garlic- 1 daily; Herb for gallstones  These are separate supplements     No current facility-administered medications for this visit.     Allergies as of 12/18/2018  . (No Known Allergies)    Family History  Problem Relation Age of Onset  . Diabetes Mother   . Hypertension Mother   . Dementia Mother   . Cancer Father        pancreatic  . Multiple sclerosis Sister   . Drug abuse Brother   . Crohn's disease Brother   . Diabetes Maternal Grandmother   . Vision loss Maternal Grandmother   . Cancer Maternal Grandfather        prostate  . Cancer Paternal Grandmother        pancreatic    Social History   Socioeconomic History  . Marital status:  Widowed    Spouse name: Not on file  . Number of children: Not on file  . Years of education: Not on file  . Highest education level: Not on file  Occupational History  . Occupation: retired    Fish farm manager: Engineer, manufacturing  Social Needs  . Financial resource strain: Not on file  . Food insecurity    Worry: Not on file    Inability: Not on file  . Transportation needs    Medical: Not on file    Non-medical: Not on file  Tobacco Use  . Smoking status: Former Smoker    Packs/day: 1.00    Years: 4.00    Pack years: 4.00    Types: Cigarettes    Quit date: 06/13/1965    Years since quitting: 53.5  . Smokeless tobacco: Never Used  Substance and Sexual Activity  . Alcohol use: No  . Drug use: No  . Sexual activity: Not Currently    Birth control/protection: Post-menopausal  Lifestyle  . Physical activity    Days per week: Not on file    Minutes per session: Not on file  . Stress: Not  on file  Relationships  . Social Herbalist on phone: Not on file    Gets together: Not on file    Attends religious service: Not on file    Active member of club or organization: Not on file    Attends meetings of clubs or organizations: Not on file    Relationship status: Not on file  . Intimate partner violence    Fear of current or ex partner: Not on file    Emotionally abused: Not on file    Physically abused: Not on file    Forced sexual activity: Not on file  Other Topics Concern  . Not on file  Social History Narrative  . Not on file    Review of Systems: ALL ROS discussed and all others negative except listed in HPI.  Physical Exam: Complete physical exam not performed due to the limits inherent in a telehealth encounter.  General: Awake, alert, and oriented, and well communicative. In no acute distress.  HEENT: EOMI, non-icteric sclera, NCAT, MMM  Neck: Normal movement of head and neck  Pulm: No labored breathing, speaking in full sentences without conversational dyspnea  Derm: No apparent lesions or bruising in visible field  MS: Moves all visible extremities without noticeable abnormality  Psych: Pleasant, cooperative, normal speech, normal affect and normal insight Neuro: Alert and appropriate   Aloysuis Ribaudo L. Tarri Glenn, MD, MPH [facility] 12/17/2018, 4:25 PM

## 2018-12-18 ENCOUNTER — Encounter: Payer: Self-pay | Admitting: Gastroenterology

## 2018-12-18 ENCOUNTER — Ambulatory Visit (INDEPENDENT_AMBULATORY_CARE_PROVIDER_SITE_OTHER): Payer: PPO | Admitting: Gastroenterology

## 2018-12-18 ENCOUNTER — Other Ambulatory Visit: Payer: Self-pay

## 2018-12-18 VITALS — Ht 64.0 in | Wt 237.0 lb

## 2018-12-18 DIAGNOSIS — R140 Abdominal distension (gaseous): Secondary | ICD-10-CM

## 2018-12-18 DIAGNOSIS — Z1211 Encounter for screening for malignant neoplasm of colon: Secondary | ICD-10-CM | POA: Diagnosis not present

## 2018-12-18 MED ORDER — NA SULFATE-K SULFATE-MG SULF 17.5-3.13-1.6 GM/177ML PO SOLN: 1.0000 | ORAL | 0 refills | Status: AC

## 2018-12-18 NOTE — Patient Instructions (Addendum)
I have recommended a colonoscopy.   Tips for colonoscopy:  -Stay well hydrated for 3-4 days prior to the exam. This reduces nausea and dehydration.  -To prevent skin/hemorrhoid irritation - prior to wiping, put A&Dointment or vaseline on the toilet paper. -Keep a towel or pad on the bed.  -Drink  64oz of clear liquids in the morning of prep day (prior to starting the prep) to be sure that there is enough fluid to flush the colon and stay hydrated!!!! This is in addition to the fluids required for preparation. -Use of a flavored hard candy, such as grape Anise Salvo, can counteract some of the flavor of the prep and may prevent some nausea.   Thank you for your patience with me and our technology today! I look forward to meeting you in person in the future.

## 2018-12-20 ENCOUNTER — Encounter: Payer: Self-pay | Admitting: Gastroenterology

## 2018-12-24 ENCOUNTER — Encounter: Payer: Self-pay | Admitting: Cardiology

## 2018-12-24 NOTE — Progress Notes (Deleted)
Cardiology Office Note   Date:  12/24/2018   ID:  Erin Vazquez, DOB 1940-11-05, MRN [id number]  PCP:  Nolene Ebbs, MD  Cardiologist:   No primary care provider on file. Referring:  ***  No chief complaint on file.     History of Present Illness: Erin Vazquez is a 78 y.o. female who is referred by *** for evaluation of ***   She has difficult to control HTN and DM.  ***    Past Medical History:  Diagnosis Date  . Arthritis   . Eczema   . Fibrocystic breast    Left  . Gallstones   . Genital herpes   . History of cardiomegaly   . History of iron deficiency anemia   . HTN (hypertension)   . Hypothyroid   . Numbness    tips of fingers and toes  . Ovarian cyst    pt unaware  . Pre-diabetes   . Seasonal allergies   . Sigmoid diverticulosis   . Venous insufficiency     Past Surgical History:  Procedure Laterality Date  . BREAST RECONSTRUCTION  1976  . BREAST SURGERY Left 1961   Removed 3 fibroadenoma   . CERVICAL CONE BIOPSY  1981  . KNEE ARTHROSCOPY Left 2010 Dr. Aline Brochure  . MASTECTOMY, PARTIAL Left 1979   Subtotal  . OPEN REDUCTION INTERNAL FIXATION (ORIF) DISTAL RADIAL FRACTURE Left 04/24/2018   Procedure: OPEN REDUCTION INTERNAL FIXATION (ORIF) LEFT DISTAL RADIAL FRACTURE;  Surgeon: Renette Butters, MD;  Location: [place];  Service: Orthopedics;  Laterality: Left;  . SHOULDER SURGERY Right 1976, 11/2017   lipoma excision  . TUBAL LIGATION       Current Outpatient Medications  Medication Sig Dispense Refill  . BIOTIN PO Take by mouth daily.    . Cholecalciferol (VITAMIN D-3 PO) Take 5,000 Units by mouth daily.    . diclofenac sodium (VOLTAREN) 1 % GEL Apply 2 g topically 4 (four) times daily. Rub into affected area of foot 2 to 4 times daily 100 g 2  . docusate sodium (COLACE) 100 MG capsule Take 1 capsule (100 mg total) by mouth 2 (two) times daily. To prevent constipation while taking pain medication. 60 capsule 0  . ferrous  sulfate 325 (65 FE) MG tablet Take 325 mg by mouth daily with breakfast.    . gabapentin (NEURONTIN) 100 MG capsule Take 1 capsule (100 mg total) by mouth 3 (three) times daily for 14 days. For pain. 42 capsule 0  . hydrochlorothiazide (MICROZIDE) 12.5 MG capsule Take 12.5 mg by mouth daily.    Marland Kitchen ibuprofen (ADVIL,MOTRIN) 800 MG tablet Take 800 mg by mouth every other day.    . levothyroxine (SYNTHROID, LEVOTHROID) 75 MCG tablet Take 75 mcg by mouth daily.      Marland Kitchen losartan-hydrochlorothiazide (HYZAAR) 50-12.5 MG per tablet Take 1 tablet by mouth daily.    . Na Sulfate-K Sulfate-Mg Sulf 17.5-3.13-1.6 GM/177ML SOLN Take 1 kit by mouth as directed. 354 mL 0  . ondansetron (ZOFRAN) 4 MG tablet Take 1 tablet (4 mg total) by mouth every 8 (eight) hours as needed for nausea or vomiting. 20 tablet 0  . OVER THE COUNTER MEDICATION Digestion system supplement    . UNABLE TO FIND Tumeric-1 daily; Hawthorne WUJWJ-1-9 tabs daily; Garlic- 1 daily; Herb for gallstones  These are separate supplements     No current facility-administered medications for this visit.     Allergies:   Patient has no  known allergies.    Social History:  The patient  reports that she quit smoking about 53 years ago. Her smoking use included cigarettes. She has a 4.00 pack-year smoking history. She has never used smokeless tobacco. She reports that she does not drink alcohol or use drugs.   Family History:  The patient's ***family history includes Cancer in her father, maternal grandfather, and paternal grandmother; Crohn's disease in her brother; Dementia in her mother; Diabetes in her maternal grandmother and mother; Drug abuse in her brother; Hypertension in her mother; Multiple sclerosis in her sister; Vision loss in her maternal grandmother.    ROS:  Please see the history of present illness.   Otherwise, review of systems are positive for {NONE DEFAULTED:18576::"none"}.   All other systems are reviewed and negative.     PHYSICAL EXAM: VS:  There were no vitals taken for this visit. , BMI There is no height or weight on file to calculate BMI. GENERAL:  Well appearing HEENT:  Pupils equal round and reactive, fundi not visualized, oral mucosa unremarkable NECK:  No jugular venous distention, waveform within normal limits, carotid upstroke brisk and symmetric, no bruits, no thyromegaly LYMPHATICS:  No cervical, inguinal adenopathy LUNGS:  Clear to auscultation bilaterally BACK:  No CVA tenderness CHEST:  Unremarkable HEART:  PMI not displaced or sustained,S1 and S2 within normal limits, no S3, no S4, no clicks, no rubs, *** murmurs ABD:  Flat, positive bowel sounds normal in frequency in pitch, no bruits, no rebound, no guarding, no midline pulsatile mass, no hepatomegaly, no splenomegaly EXT:  2 plus pulses throughout, no edema, no cyanosis no clubbing SKIN:  No rashes no nodules NEURO:  Cranial nerves II through XII grossly intact, motor grossly intact throughout PSYCH:  Cognitively intact, oriented to person place and time    EKG:  EKG {ACTION; IS/IS DKC:46190122} ordered today. The ekg ordered today demonstrates ***   Recent Labs: 04/24/2018: BUN 21; Creatinine, Ser 0.70; Hemoglobin 12.9; Potassium 4.1; Sodium 140    Lipid Panel    Component Value Date/Time   CHOL 172 12/26/2007 0040   TRIG 102 12/26/2007 0040   HDL 43 12/26/2007 0040   CHOLHDL 4.0 Ratio 12/26/2007 0040   VLDL 20 12/26/2007 0040   LDLCALC 109 (H) 12/26/2007 0040      Wt Readings from Last 3 Encounters:  12/18/18 237 lb (107.5 kg)  04/24/18 237 lb 8 oz (107.7 kg)  09/14/17 234 lb (106.1 kg)      Other studies Reviewed: Additional studies/ records that were reviewed today include: ***. Review of the above records demonstrates:  Please see elsewhere in the note.  ***   ASSESSMENT AND PLAN:  HTN:  ***  DM:  ***    Current medicines are reviewed at length with the patient today.  The patient {ACTIONS; HAS/DOES  NOT HAVE:19233} concerns regarding medicines.  The following changes have been made:  {PLAN; NO CHANGE:13088:s}  Labs/ tests ordered today include: *** No orders of the defined types were placed in this encounter.    Disposition:   FU with ***    Signed, Minus Breeding, MD  12/24/2018 4:40 PM    [facility]

## 2018-12-25 ENCOUNTER — Ambulatory Visit: Payer: PPO | Admitting: Cardiology

## 2018-12-26 NOTE — Progress Notes (Signed)
Referring-Erin Avbuere MD Reason for referral-Hypertension and cardiac enlargement  HPI: 78 yo female for evaluation of hypertension and cardiac enlargement at request of Erin Ebbs MD. pPatient recently had an x-ray which suggested cardiac enlargement and also with hypertension.  Cardiology asked to evaluate.  She has dyspnea with more vigorous activities but not routine activities.  No orthopnea, PND, chest pain or syncope.  She has chronic pedal edema related to venous insufficiency.  Current Outpatient Medications  Medication Sig Dispense Refill  . BIOTIN PO Take by mouth daily.    . Cholecalciferol (VITAMIN D-3 PO) Take 5,000 Units by mouth daily.    . diclofenac sodium (VOLTAREN) 1 % GEL Apply 2 g topically 4 (four) times daily. Rub into affected area of foot 2 to 4 times daily 100 g 2  . docusate sodium (COLACE) 100 MG capsule Take 1 capsule (100 mg total) by mouth 2 (two) times daily. To prevent constipation while taking pain medication. 60 capsule 0  . ferrous sulfate 325 (65 FE) MG tablet Take 325 mg by mouth daily with breakfast.    . levothyroxine (SYNTHROID, LEVOTHROID) 75 MCG tablet Take 75 mcg by mouth daily.      Marland Kitchen losartan-hydrochlorothiazide (HYZAAR) 50-12.5 MG per tablet Take 1 tablet by mouth daily.    . Multiple Vitamins-Minerals (HAIR SKIN NAILS PO) Take by mouth.    . Na Sulfate-K Sulfate-Mg Sulf 17.5-3.13-1.6 GM/177ML SOLN Take 1 kit by mouth as directed. 354 mL 0  . OVER THE COUNTER MEDICATION Digestion system supplement    . OVER THE COUNTER MEDICATION Nitric Oxide    . OVER THE COUNTER MEDICATION DHT-blocker    . UNABLE TO FIND Tumeric-1 daily; Hawthorne QBVQX-4-5 tabs daily; Garlic- 1 daily; Herb for gallstones  These are separate supplements    . gabapentin (NEURONTIN) 100 MG capsule Take 1 capsule (100 mg total) by mouth 3 (three) times daily for 14 days. For pain. 42 capsule 0   No current facility-administered medications for this visit.     No Known  Allergies   Past Medical History:  Diagnosis Date  . Arthritis   . Eczema   . Fibrocystic breast    Left  . Gallstones   . Genital herpes   . History of cardiomegaly   . History of iron deficiency anemia   . HTN (hypertension)   . Hypothyroid   . Ovarian cyst    pt unaware  . Pre-diabetes   . Sigmoid diverticulosis   . Venous insufficiency     Past Surgical History:  Procedure Laterality Date  . BREAST RECONSTRUCTION  1976  . BREAST SURGERY Left 1961   Removed 3 fibroadenoma   . CERVICAL CONE BIOPSY  1981  . KNEE ARTHROSCOPY Left 2010 Dr. Aline Brochure  . MASTECTOMY, PARTIAL Left 1979   Subtotal  . OPEN REDUCTION INTERNAL FIXATION (ORIF) DISTAL RADIAL FRACTURE Left 04/24/2018   Procedure: OPEN REDUCTION INTERNAL FIXATION (ORIF) LEFT DISTAL RADIAL FRACTURE;  Surgeon: Renette Butters, MD;  Location: [place];  Service: Orthopedics;  Laterality: Left;  . SHOULDER SURGERY Right 1976, 11/2017   lipoma excision  . TUBAL LIGATION      Social History   Socioeconomic History  . Marital status: Widowed    Spouse name: Not on file  . Number of children: 7  . Years of education: Not on file  . Highest education level: Not on file  Occupational History  . Occupation: retired    Fish farm manager: Engineer, manufacturing  Social Needs  . Financial resource strain: Not on file  . Food insecurity    Worry: Not on file    Inability: Not on file  . Transportation needs    Medical: Not on file    Non-medical: Not on file  Tobacco Use  . Smoking status: Former Smoker    Packs/day: 1.00    Years: 4.00    Pack years: 4.00    Types: Cigarettes    Quit date: 06/13/1965    Years since quitting: 53.5  . Smokeless tobacco: Never Used  Substance and Sexual Activity  . Alcohol use: No  . Drug use: No  . Sexual activity: Not Currently    Birth control/protection: Post-menopausal  Lifestyle  . Physical activity    Days per week: Not on file    Minutes per session: Not on file   . Stress: Not on file  Relationships  . Social Herbalist on phone: Not on file    Gets together: Not on file    Attends religious service: Not on file    Active member of club or organization: Not on file    Attends meetings of clubs or organizations: Not on file    Relationship status: Not on file  . Intimate partner violence    Fear of current or ex partner: Not on file    Emotionally abused: Not on file    Physically abused: Not on file    Forced sexual activity: Not on file  Other Topics Concern  . Not on file  Social History Narrative  . Not on file    Family History  Problem Relation Age of Onset  . Diabetes Mother   . Hypertension Mother   . Dementia Mother   . Cancer Father        pancreatic  . Multiple sclerosis Sister   . Drug abuse Brother   . Crohn's disease Brother   . Diabetes Maternal Grandmother   . Vision loss Maternal Grandmother   . Cancer Maternal Grandfather        prostate  . Cancer Paternal Grandmother        pancreatic    ROS: no fevers or chills, productive cough, hemoptysis, dysphasia, odynophagia, melena, hematochezia, dysuria, hematuria, rash, seizure activity, orthopnea, PND, pedal edema, claudication. Remaining systems are negative.  Physical Exam:   Blood pressure 138/86, pulse 71, temperature (!) 96.8 F (36 C), height '5\' 4"'$  (1.626 m), weight 228 lb 6.4 oz (103.6 kg), SpO2 98 %.  General:  Well developed/ in NAD Skin warm/dry Patient not depressed No peripheral clubbing Back-normal HEENT-normal/normal eyelids Neck supple/normal carotid upstroke bilaterally; no bruits; no JVD; no thyromegaly chest - CTA/ normal expansion CV - RRR/normal S1 and S2; no murmurs, rubs or gallops;  PMI nondisplaced Abdomen -NT/ND, no HSM, no mass, + bowel sounds, no bruit 2+ femoral pulses, no bruits Ext-trace edema, no chords, 2+ DP Neuro-grossly nonfocal  ECG - NSR, cannot R/O septal MI; personally reviewed  A/P  1  Hypertension-patient states her systolic blood pressure is typically 115-120.  We will continue with present dose of Hyzaar.  This can be increased as needed for blood pressure control.  2 cardiac enlargement-noted on previous chest x-ray.  We will arrange an echo to further assess.  3 abnormal ECG-cannot rule out septal infarct.  We will arrange echocardiogram to assess wall motion.  If normal we will not pursue further evaluation as she has not having cardiac symptoms.  4  hyperlipidemia-Per primary care.  Kirk Ruths, MD

## 2018-12-27 ENCOUNTER — Telehealth: Payer: Self-pay | Admitting: Gastroenterology

## 2018-12-27 NOTE — Telephone Encounter (Signed)

## 2018-12-28 ENCOUNTER — Encounter: Payer: Self-pay | Admitting: Gastroenterology

## 2018-12-28 ENCOUNTER — Other Ambulatory Visit: Payer: Self-pay

## 2018-12-28 ENCOUNTER — Ambulatory Visit (AMBULATORY_SURGERY_CENTER): Payer: PPO | Admitting: Gastroenterology

## 2018-12-28 VITALS — BP 114/68 | HR 56 | Temp 97.5°F | Resp 11 | Ht 64.0 in | Wt 237.0 lb

## 2018-12-28 DIAGNOSIS — Z1211 Encounter for screening for malignant neoplasm of colon: Secondary | ICD-10-CM | POA: Diagnosis not present

## 2018-12-28 MED ORDER — SODIUM CHLORIDE 0.9 % IV SOLN: 500.0000 mL | Freq: Once | INTRAVENOUS | Status: DC

## 2018-12-28 NOTE — Progress Notes (Signed)
A and O x3. Report to RN. Tolerated MAC anesthesia well.

## 2018-12-28 NOTE — Op Note (Signed)
[facility] Patient Name: Erin Vazquez Procedure Date: 12/28/2018 8:40 AM MRN: [id number] Endoscopist: Thornton Park MD, MD Age: 78 Referring MD:  Date of Birth: Jul 06, 1940 Gender: Female Account #: [id number] Procedure:                Colonoscopy Indications:              Screening for colorectal malignant neoplasm                           Normal screening colonoscopy in [place] in 2004                           No known family history of colon cancer or polyps Medicines:                See the Anesthesia note for documentation of the                            administered medications Procedure:                Pre-Anesthesia Assessment:                           - Prior to the procedure, a History and Physical                            was performed, and patient medications and                            allergies were reviewed. The patient's tolerance of                            previous anesthesia was also reviewed. The risks                            and benefits of the procedure and the sedation                            options and risks were discussed with the patient.                            All questions were answered, and informed consent                            was obtained. Prior Anticoagulants: The patient has                            taken no previous anticoagulant or antiplatelet                            agents. ASA Grade Assessment: II - A patient with                            mild systemic disease. After reviewing the risks  and benefits, the patient was deemed in                            satisfactory condition to undergo the procedure.                           After obtaining informed consent, the colonoscope                            was passed under direct vision. Throughout the                            procedure, the patient's blood pressure, pulse, and                            oxygen saturations  were monitored continuously. The                            Colonoscope was introduced through the anus and                            advanced to the the terminal ileum, with                            identification of the appendiceal orifice and IC                            valve. A second forward view of the right colon was                            performed. The colonoscopy was performed with                            moderate difficulty due to significant looping and                            a tortuous colon. Successful completion of the                            procedure was aided by changing the patient to a                            prone position and applying abdominal pressure. The                            patient tolerated the procedure well. The quality                            of the bowel preparation was good. The terminal                            ileum, ileocecal valve, appendiceal orifice, and  rectum were photographed. Scope In: 8:53:36 AM Scope Out: 9:13:42 AM Scope Withdrawal Time: 0 hours 8 minutes 59 seconds  Total Procedure Duration: 0 hours 20 minutes 6 seconds  Findings:                 The perianal and digital rectal examinations were                            normal.                           Multiple small and large-mouthed diverticula were                            found in the entire colon. The diverticulosis is                            severe in the sigmoid colon and descending colon                            and moderate in the right colon. There are few                            located in the transverse colon.                           The exam was otherwise without abnormality on                            direct and retroflexion views. Complications:            No immediate complications. Estimated Blood Loss:     Estimated blood loss: none. Impression:               - Diverticulosis in the entire examined  colon.                           - The examination was otherwise normal on direct                            and retroflexion views.                           - No specimens collected. Recommendation:           - Patient has a contact number available for                            emergencies. The signs and symptoms of potential                            delayed complications were discussed with the                            patient. Return to normal activities tomorrow.  Written discharge instructions were provided to the                            patient.                           - High fiber diet today.                           - Continue present medications.                           - No additional screening colonoscopy recommended                            at this time.                           - Return to the office as needed in the future. Thornton Park MD, MD 12/28/2018 9:21:41 AM This report has been signed electronically.

## 2018-12-28 NOTE — Patient Instructions (Signed)
YOU HAD AN ENDOSCOPIC PROCEDURE TODAY AT THE [facility]:   Refer to the procedure report that was given to you for any specific questions about what was found during the examination.  If the procedure report does not answer your questions, please call your gastroenterologist to clarify.  If you requested that your care partner not be given the details of your procedure findings, then the procedure report has been included in a sealed envelope for you to review at your convenience later.  YOU SHOULD EXPECT: Some feelings of bloating in the abdomen. Passage of more gas than usual.  Walking can help get rid of the air that was put into your GI tract during the procedure and reduce the bloating. If you had a lower endoscopy (such as a colonoscopy or flexible sigmoidoscopy) you may notice spotting of blood in your stool or on the toilet paper. If you underwent a bowel prep for your procedure, you may not have a normal bowel movement for a few days.  Please Note:  You might notice some irritation and congestion in your nose or some drainage.  This is from the oxygen used during your procedure.  There is no need for concern and it should clear up in a day or so.  SYMPTOMS TO REPORT IMMEDIATELY:   Following lower endoscopy (colonoscopy or flexible sigmoidoscopy):  Excessive amounts of blood in the stool  Significant tenderness or worsening of abdominal pains  Swelling of the abdomen that is new, acute  Fever of 100F or higher  For urgent or emergent issues, a gastroenterologist can be reached at any hour by calling [phone].   DIET:  We do recommend a small meal at first, but then you may proceed to your regular diet.  Drink plenty of fluids but you should avoid alcoholic beverages for 24 hours.  ACTIVITY:  You should plan to take it easy for the rest of today and you should NOT DRIVE or use heavy machinery until tomorrow (because of the sedation medicines used during the test).     FOLLOW UP: Our staff will call the number listed on your records 48-72 hours following your procedure to check on you and address any questions or concerns that you may have regarding the information given to you following your procedure. If we do not reach you, we will leave a message.  We will attempt to reach you two times.  During this call, we will ask if you have developed any symptoms of COVID 19. If you develop any symptoms (ie: fever, flu-like symptoms, shortness of breath, cough etc.) before then, please call [phone].  If you test positive for Covid 19 in the 2 weeks post procedure, please call and report this information to us.    If any biopsies were taken you will be contacted by phone or by letter within the next 1-3 weeks.  Please call us at [phone] if you have not heard about the biopsies in 3 weeks.    SIGNATURES/CONFIDENTIALITY: You and/or your care partner have signed paperwork which will be entered into your electronic medical record.  These signatures attest to the fact that that the information above on your After Visit Summary has been reviewed and is understood.  Full responsibility of the confidentiality of this discharge information lies with you and/or your care-partner. 

## 2018-12-31 DIAGNOSIS — M654 Radial styloid tenosynovitis [de Quervain]: Secondary | ICD-10-CM | POA: Diagnosis not present

## 2019-01-01 ENCOUNTER — Telehealth: Payer: Self-pay

## 2019-01-01 ENCOUNTER — Other Ambulatory Visit: Payer: Self-pay

## 2019-01-01 ENCOUNTER — Encounter: Payer: Self-pay | Admitting: Cardiology

## 2019-01-01 ENCOUNTER — Ambulatory Visit: Payer: PPO | Admitting: Cardiology

## 2019-01-01 ENCOUNTER — Telehealth: Payer: Self-pay | Admitting: Cardiology

## 2019-01-01 VITALS — BP 138/86 | HR 71 | Temp 96.8°F | Ht 64.0 in | Wt 228.4 lb

## 2019-01-01 DIAGNOSIS — I10 Essential (primary) hypertension: Secondary | ICD-10-CM

## 2019-01-01 DIAGNOSIS — I517 Cardiomegaly: Secondary | ICD-10-CM | POA: Diagnosis not present

## 2019-01-01 NOTE — Telephone Encounter (Signed)
No answer, left message to call back later today, B.Rogue Rafalski RN. 

## 2019-01-01 NOTE — Patient Instructions (Signed)
Medication Instructions:  NO CHANGE If you need a refill on your cardiac medications before your next appointment, please call your pharmacy.   Lab work: If you have labs (blood work) drawn today and your tests are completely normal, you will receive your results only by: Marland Kitchen MyChart Message (if you have MyChart) OR . A paper copy in the mail If you have any lab test that is abnormal or we need to change your treatment, we will call you to review the results.  Testing/Procedures: Your physician has requested that you have an echocardiogram. Echocardiography is a painless test that uses sound waves to create images of your heart. It provides your doctor with information about the size and shape of your heart and how well your heart's chambers and valves are working. This procedure takes approximately one hour. There are no restrictions for this procedure.[place]    Follow-Up: At [facility], you and your health needs are our priority.  As part of our continuing mission to provide you with exceptional heart care, we have created designated Provider Care Teams.  These Care Teams include your primary Cardiologist (physician) and Advanced Practice Providers (APPs -  Physician Assistants and Nurse Practitioners) who all work together to provide you with the care you need, when you need it. . Your physician recommends that you schedule a follow-up appointment in: AS NEEDED PENDING TEST RESULTS

## 2019-01-01 NOTE — Telephone Encounter (Signed)
  Follow up Call-  Call back number 12/28/2018  Post procedure Call Back phone  # [phone]  Permission to leave phone message Yes  Some recent data might be hidden     Patient questions:  Do you have a fever, pain , or abdominal swelling? No. Pain Score  0 *  Have you tolerated food without any problems? Yes.    Have you been able to return to your normal activities? Yes.    Do you have any questions about your discharge instructions: Diet   No. Medications  No. Follow up visit  No.  Do you have questions or concerns about your Care? No.  Actions: * If pain score is 4 or above: No action needed, pain <4. 1. Have you developed a fever since your procedure? no  2.   Have you had an respiratory symptoms (SOB or cough) since your procedure? no  3.   Have you tested positive for COVID 19 since your procedure no  4.   Have you had any family members/close contacts diagnosed with the COVID 19 since your procedure?  no   If yes to any of these questions please route to Joylene John, RN and Alphonsa Gin, Therapist, sports.

## 2019-01-01 NOTE — Telephone Encounter (Signed)
LVM, reminding pt of her appt on 01-01-19 with Dr Stanford Breed.

## 2019-01-03 DIAGNOSIS — B081 Molluscum contagiosum: Secondary | ICD-10-CM | POA: Diagnosis not present

## 2019-01-03 DIAGNOSIS — L2089 Other atopic dermatitis: Secondary | ICD-10-CM | POA: Diagnosis not present

## 2019-01-08 ENCOUNTER — Ambulatory Visit (HOSPITAL_COMMUNITY): Payer: PPO | Attending: Internal Medicine

## 2019-01-17 ENCOUNTER — Telehealth: Payer: Self-pay | Admitting: Cardiology

## 2019-01-17 ENCOUNTER — Other Ambulatory Visit: Payer: Self-pay

## 2019-01-17 ENCOUNTER — Ambulatory Visit (HOSPITAL_COMMUNITY): Payer: PPO | Attending: Cardiology

## 2019-01-17 DIAGNOSIS — M47816 Spondylosis without myelopathy or radiculopathy, lumbar region: Secondary | ICD-10-CM | POA: Diagnosis not present

## 2019-01-17 DIAGNOSIS — I10 Essential (primary) hypertension: Secondary | ICD-10-CM

## 2019-01-17 DIAGNOSIS — M545 Low back pain: Secondary | ICD-10-CM | POA: Diagnosis not present

## 2019-01-17 DIAGNOSIS — M25551 Pain in right hip: Secondary | ICD-10-CM | POA: Diagnosis not present

## 2019-01-17 DIAGNOSIS — M25552 Pain in left hip: Secondary | ICD-10-CM | POA: Diagnosis not present

## 2019-01-17 NOTE — Telephone Encounter (Signed)
Called patient, she states that a DX was placed in her chart for abnomal ECHO from 2008, I advised that it looks like it was manually placed in, maybe when [facility] joined with [facility], I advised that I did not have the ECHO to look to see why it was abnormal, but that we would be able to verify with the one she had today to see if it came to be abnormal. Patient verbalized understanding, no other questions at this time.

## 2019-01-17 NOTE — Telephone Encounter (Signed)
New message      Pt had echo today.  She came to check out wanting to talk to someone because she saw on mychart some diagnosis that she did not know she had.  At your convenience, please call pt to discuss diagnosis on mychart.

## 2019-01-18 ENCOUNTER — Telehealth: Payer: Self-pay | Admitting: Cardiology

## 2019-01-18 NOTE — Telephone Encounter (Signed)
Patient viewed results in [facility]. She read that the study was normal per MD but she had concerns about: 1 ) LV wall thickness 2 ) LV diastolic function - impaired relaxation 3 ) aortic valve calcifications   Patient would like to know lifestyle changes that she can make to potentially improve or slow the progression of these findings. Explained that good BP control and cholesterol control is important and explained what these findings mean  She would like MD opinion.

## 2019-01-18 NOTE — Telephone Encounter (Signed)
Notes recorded by Lelon Perla, MD on 01/17/2019 at 5:00 PM EDT  Normal LV function  Erin Vazquez

## 2019-01-18 NOTE — Telephone Encounter (Signed)
  Patient saw her echo results on MyChart and has some questions about the findings.

## 2019-01-18 NOTE — Telephone Encounter (Signed)
LVH and mild diastolic dysfunction related to hypertension; agree with BP control; aortic valve sclerosis not uncommon in her age group Erin Vazquez

## 2019-01-18 NOTE — Telephone Encounter (Signed)
Spoke with pt, Aware of dr crenshaw's recommendations.  °

## 2019-01-29 DIAGNOSIS — I10 Essential (primary) hypertension: Secondary | ICD-10-CM | POA: Diagnosis not present

## 2019-01-29 DIAGNOSIS — A630 Anogenital (venereal) warts: Secondary | ICD-10-CM | POA: Diagnosis not present

## 2019-01-29 DIAGNOSIS — I872 Venous insufficiency (chronic) (peripheral): Secondary | ICD-10-CM | POA: Diagnosis not present

## 2019-01-29 DIAGNOSIS — K5730 Diverticulosis of large intestine without perforation or abscess without bleeding: Secondary | ICD-10-CM | POA: Diagnosis not present

## 2019-02-04 ENCOUNTER — Ambulatory Visit (INDEPENDENT_AMBULATORY_CARE_PROVIDER_SITE_OTHER): Payer: PPO | Admitting: Otolaryngology

## 2019-02-04 DIAGNOSIS — H93293 Other abnormal auditory perceptions, bilateral: Secondary | ICD-10-CM | POA: Diagnosis not present

## 2019-02-17 DIAGNOSIS — M25552 Pain in left hip: Secondary | ICD-10-CM | POA: Diagnosis not present

## 2019-02-17 DIAGNOSIS — M25551 Pain in right hip: Secondary | ICD-10-CM | POA: Diagnosis not present

## 2019-03-01 DIAGNOSIS — R600 Localized edema: Secondary | ICD-10-CM | POA: Diagnosis not present

## 2019-03-01 DIAGNOSIS — I8311 Varicose veins of right lower extremity with inflammation: Secondary | ICD-10-CM | POA: Diagnosis not present

## 2019-03-01 DIAGNOSIS — I8312 Varicose veins of left lower extremity with inflammation: Secondary | ICD-10-CM | POA: Diagnosis not present

## 2019-03-13 DIAGNOSIS — R600 Localized edema: Secondary | ICD-10-CM | POA: Diagnosis not present

## 2019-03-13 DIAGNOSIS — I8311 Varicose veins of right lower extremity with inflammation: Secondary | ICD-10-CM | POA: Diagnosis not present

## 2019-03-13 DIAGNOSIS — I8312 Varicose veins of left lower extremity with inflammation: Secondary | ICD-10-CM | POA: Diagnosis not present

## 2019-03-19 DIAGNOSIS — M25551 Pain in right hip: Secondary | ICD-10-CM | POA: Diagnosis not present

## 2019-03-19 DIAGNOSIS — M25552 Pain in left hip: Secondary | ICD-10-CM | POA: Diagnosis not present

## 2019-03-20 DIAGNOSIS — I8311 Varicose veins of right lower extremity with inflammation: Secondary | ICD-10-CM | POA: Diagnosis not present

## 2019-03-20 DIAGNOSIS — I8312 Varicose veins of left lower extremity with inflammation: Secondary | ICD-10-CM | POA: Diagnosis not present

## 2019-03-20 DIAGNOSIS — R600 Localized edema: Secondary | ICD-10-CM | POA: Diagnosis not present

## 2019-04-09 DIAGNOSIS — Z0000 Encounter for general adult medical examination without abnormal findings: Secondary | ICD-10-CM | POA: Diagnosis not present

## 2019-04-09 DIAGNOSIS — K8080 Other cholelithiasis without obstruction: Secondary | ICD-10-CM | POA: Diagnosis not present

## 2019-04-09 DIAGNOSIS — I872 Venous insufficiency (chronic) (peripheral): Secondary | ICD-10-CM | POA: Diagnosis not present

## 2019-04-09 DIAGNOSIS — M4716 Other spondylosis with myelopathy, lumbar region: Secondary | ICD-10-CM | POA: Diagnosis not present

## 2019-04-09 DIAGNOSIS — R7303 Prediabetes: Secondary | ICD-10-CM | POA: Diagnosis not present

## 2019-04-09 DIAGNOSIS — L26 Exfoliative dermatitis: Secondary | ICD-10-CM | POA: Diagnosis not present

## 2019-04-09 DIAGNOSIS — I10 Essential (primary) hypertension: Secondary | ICD-10-CM | POA: Diagnosis not present

## 2019-04-09 DIAGNOSIS — E038 Other specified hypothyroidism: Secondary | ICD-10-CM | POA: Diagnosis not present

## 2019-04-17 DIAGNOSIS — I8312 Varicose veins of left lower extremity with inflammation: Secondary | ICD-10-CM | POA: Diagnosis not present

## 2019-04-18 DIAGNOSIS — R229 Localized swelling, mass and lump, unspecified: Secondary | ICD-10-CM | POA: Diagnosis not present

## 2019-04-18 DIAGNOSIS — R7303 Prediabetes: Secondary | ICD-10-CM | POA: Diagnosis not present

## 2019-04-18 DIAGNOSIS — I10 Essential (primary) hypertension: Secondary | ICD-10-CM | POA: Diagnosis not present

## 2019-04-18 DIAGNOSIS — N6019 Diffuse cystic mastopathy of unspecified breast: Secondary | ICD-10-CM | POA: Diagnosis not present

## 2019-04-19 DIAGNOSIS — I8312 Varicose veins of left lower extremity with inflammation: Secondary | ICD-10-CM | POA: Diagnosis not present

## 2019-04-23 ENCOUNTER — Encounter (HOSPITAL_COMMUNITY): Payer: Self-pay

## 2019-04-23 ENCOUNTER — Other Ambulatory Visit: Payer: Self-pay

## 2019-04-23 ENCOUNTER — Emergency Department (HOSPITAL_COMMUNITY): Payer: PPO

## 2019-04-23 ENCOUNTER — Emergency Department (HOSPITAL_COMMUNITY)
Admission: EM | Admit: 2019-04-23 | Discharge: 2019-04-23 | Disposition: A | Discharge status: Home / Self Care | Payer: PPO | Attending: Emergency Medicine | Admitting: Emergency Medicine

## 2019-04-23 DIAGNOSIS — E039 Hypothyroidism, unspecified: Secondary | ICD-10-CM | POA: Diagnosis not present

## 2019-04-23 DIAGNOSIS — Y999 Unspecified external cause status: Secondary | ICD-10-CM | POA: Insufficient documentation

## 2019-04-23 DIAGNOSIS — Z79899 Other long term (current) drug therapy: Secondary | ICD-10-CM | POA: Diagnosis not present

## 2019-04-23 DIAGNOSIS — Y92410 Unspecified street and highway as the place of occurrence of the external cause: Secondary | ICD-10-CM | POA: Diagnosis not present

## 2019-04-23 DIAGNOSIS — R457 State of emotional shock and stress, unspecified: Secondary | ICD-10-CM | POA: Diagnosis not present

## 2019-04-23 DIAGNOSIS — S8001XA Contusion of right knee, initial encounter: Secondary | ICD-10-CM | POA: Diagnosis not present

## 2019-04-23 DIAGNOSIS — Y93I9 Activity, other involving external motion: Secondary | ICD-10-CM | POA: Diagnosis not present

## 2019-04-23 DIAGNOSIS — S299XXA Unspecified injury of thorax, initial encounter: Secondary | ICD-10-CM | POA: Diagnosis present

## 2019-04-23 DIAGNOSIS — I10 Essential (primary) hypertension: Secondary | ICD-10-CM | POA: Diagnosis not present

## 2019-04-23 DIAGNOSIS — S2231XA Fracture of one rib, right side, initial encounter for closed fracture: Secondary | ICD-10-CM | POA: Diagnosis not present

## 2019-04-23 DIAGNOSIS — Z87891 Personal history of nicotine dependence: Secondary | ICD-10-CM | POA: Insufficient documentation

## 2019-04-23 DIAGNOSIS — S8991XA Unspecified injury of right lower leg, initial encounter: Secondary | ICD-10-CM | POA: Diagnosis not present

## 2019-04-23 MED ORDER — METHOCARBAMOL 500 MG PO TABS
500.0000 mg | ORAL_TABLET | Freq: Once | ORAL | Status: AC
  Administered 2019-04-23: 500 mg via ORAL
  Filled 2019-04-23: qty 1

## 2019-04-23 MED ORDER — HYDROCODONE-ACETAMINOPHEN 5-325 MG PO TABS
1.0000 | ORAL_TABLET | Freq: Once | ORAL | Status: AC
  Administered 2019-04-23: 1 via ORAL
  Filled 2019-04-23: qty 1

## 2019-04-23 MED ORDER — METHOCARBAMOL 500 MG PO TABS: 500.0000 mg | ORAL_TABLET | Freq: Three times a day (TID) | ORAL | 0 refills | Status: DC

## 2019-04-23 MED ORDER — HYDROCODONE-ACETAMINOPHEN 5-325 MG PO TABS: ORAL_TABLET | ORAL | 0 refills | Status: DC

## 2019-04-23 NOTE — Discharge Instructions (Signed)
Use the incentive spirometer at least 3-4 times daily as needed.  You may apply ice or heat to your chest wall.  Hold a small pillow firmly to your side to cough, sneeze, and take deep breaths several times a day.  Follow-up with your primary doctor for recheck, return to the ER for any sudden shortness of breath or if began coughing up blood.
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
## 2019-04-24 ENCOUNTER — Other Ambulatory Visit: Payer: Self-pay | Admitting: Internal Medicine

## 2019-04-24 DIAGNOSIS — R071 Chest pain on breathing: Secondary | ICD-10-CM | POA: Diagnosis not present

## 2019-04-24 DIAGNOSIS — I10 Essential (primary) hypertension: Secondary | ICD-10-CM | POA: Diagnosis not present

## 2019-04-24 DIAGNOSIS — N6011 Diffuse cystic mastopathy of right breast: Secondary | ICD-10-CM

## 2019-04-26 DIAGNOSIS — H532 Diplopia: Secondary | ICD-10-CM | POA: Diagnosis not present

## 2019-05-02 DIAGNOSIS — I10 Essential (primary) hypertension: Secondary | ICD-10-CM | POA: Diagnosis not present

## 2019-05-02 DIAGNOSIS — S2232XD Fracture of one rib, left side, subsequent encounter for fracture with routine healing: Secondary | ICD-10-CM | POA: Diagnosis not present

## 2019-05-02 DIAGNOSIS — G44319 Acute post-traumatic headache, not intractable: Secondary | ICD-10-CM | POA: Diagnosis not present

## 2019-05-02 DIAGNOSIS — K219 Gastro-esophageal reflux disease without esophagitis: Secondary | ICD-10-CM | POA: Diagnosis not present

## 2019-05-04 ENCOUNTER — Other Ambulatory Visit: Payer: Self-pay

## 2019-05-04 ENCOUNTER — Emergency Department (HOSPITAL_COMMUNITY): Payer: PPO

## 2019-05-04 ENCOUNTER — Emergency Department (HOSPITAL_COMMUNITY)
Admission: EM | Admit: 2019-05-04 | Discharge: 2019-05-04 | Disposition: A | Discharge status: Home / Self Care | Payer: PPO | Attending: Emergency Medicine | Admitting: Emergency Medicine

## 2019-05-04 ENCOUNTER — Encounter (HOSPITAL_COMMUNITY): Payer: Self-pay | Admitting: Emergency Medicine

## 2019-05-04 DIAGNOSIS — E039 Hypothyroidism, unspecified: Secondary | ICD-10-CM | POA: Insufficient documentation

## 2019-05-04 DIAGNOSIS — Y92410 Unspecified street and highway as the place of occurrence of the external cause: Secondary | ICD-10-CM | POA: Diagnosis not present

## 2019-05-04 DIAGNOSIS — E279 Disorder of adrenal gland, unspecified: Secondary | ICD-10-CM

## 2019-05-04 DIAGNOSIS — S2243XA Multiple fractures of ribs, bilateral, initial encounter for closed fracture: Secondary | ICD-10-CM | POA: Diagnosis not present

## 2019-05-04 DIAGNOSIS — E278 Other specified disorders of adrenal gland: Secondary | ICD-10-CM | POA: Insufficient documentation

## 2019-05-04 DIAGNOSIS — Y9389 Activity, other specified: Secondary | ICD-10-CM | POA: Insufficient documentation

## 2019-05-04 DIAGNOSIS — R1084 Generalized abdominal pain: Secondary | ICD-10-CM | POA: Diagnosis not present

## 2019-05-04 DIAGNOSIS — M549 Dorsalgia, unspecified: Secondary | ICD-10-CM | POA: Diagnosis not present

## 2019-05-04 DIAGNOSIS — S2232XA Fracture of one rib, left side, initial encounter for closed fracture: Secondary | ICD-10-CM | POA: Diagnosis not present

## 2019-05-04 DIAGNOSIS — M1712 Unilateral primary osteoarthritis, left knee: Secondary | ICD-10-CM | POA: Diagnosis not present

## 2019-05-04 DIAGNOSIS — H532 Diplopia: Secondary | ICD-10-CM | POA: Insufficient documentation

## 2019-05-04 DIAGNOSIS — R0600 Dyspnea, unspecified: Secondary | ICD-10-CM | POA: Insufficient documentation

## 2019-05-04 DIAGNOSIS — Z79899 Other long term (current) drug therapy: Secondary | ICD-10-CM | POA: Diagnosis not present

## 2019-05-04 DIAGNOSIS — N83201 Unspecified ovarian cyst, right side: Secondary | ICD-10-CM | POA: Insufficient documentation

## 2019-05-04 DIAGNOSIS — R0602 Shortness of breath: Secondary | ICD-10-CM | POA: Diagnosis not present

## 2019-05-04 DIAGNOSIS — I10 Essential (primary) hypertension: Secondary | ICD-10-CM | POA: Insufficient documentation

## 2019-05-04 DIAGNOSIS — R079 Chest pain, unspecified: Secondary | ICD-10-CM | POA: Diagnosis not present

## 2019-05-04 DIAGNOSIS — R519 Headache, unspecified: Secondary | ICD-10-CM | POA: Diagnosis not present

## 2019-05-04 DIAGNOSIS — N949 Unspecified condition associated with female genital organs and menstrual cycle: Secondary | ICD-10-CM

## 2019-05-04 DIAGNOSIS — M1711 Unilateral primary osteoarthritis, right knee: Secondary | ICD-10-CM | POA: Diagnosis not present

## 2019-05-04 DIAGNOSIS — Y999 Unspecified external cause status: Secondary | ICD-10-CM | POA: Diagnosis not present

## 2019-05-04 DIAGNOSIS — R109 Unspecified abdominal pain: Secondary | ICD-10-CM | POA: Diagnosis not present

## 2019-05-04 DIAGNOSIS — S0990XA Unspecified injury of head, initial encounter: Secondary | ICD-10-CM | POA: Diagnosis not present

## 2019-05-04 DIAGNOSIS — N838 Other noninflammatory disorders of ovary, fallopian tube and broad ligament: Secondary | ICD-10-CM | POA: Diagnosis not present

## 2019-05-04 LAB — BASIC METABOLIC PANEL
Anion gap: 6 (ref 5–15)
BUN: 16 mg/dL (ref 8–23)
CO2: 28 mmol/L (ref 22–32)
Calcium: 8.5 mg/dL — ABNORMAL LOW (ref 8.9–10.3)
Chloride: 105 mmol/L (ref 98–111)
Creatinine, Ser: 0.85 mg/dL (ref 0.44–1.00)
GFR calc Af Amer: >60 mL/min (ref >60)
GFR calc non Af Amer: >60 mL/min (ref >60)
Glucose, Bld: 123 mg/dL — ABNORMAL HIGH (ref 70–99)
Potassium: 4.2 mmol/L (ref 3.5–5.1)
Sodium: 139 mmol/L (ref 135–145)

## 2019-05-04 LAB — CBC
HCT: 36.1 % (ref 36.0–46.0)
Hemoglobin: 10.8 g/dL — ABNORMAL LOW (ref 12.0–15.0)
MCH: 22.4 pg — ABNORMAL LOW (ref 26.0–34.0)
MCHC: 29.9 g/dL — ABNORMAL LOW (ref 30.0–36.0)
MCV: 74.9 fL — ABNORMAL LOW (ref 80.0–100.0)
Platelets: 266 10*3/uL (ref 150–400)
RBC: 4.82 MIL/uL (ref 3.87–5.11)
RDW: 15.9 % — ABNORMAL HIGH (ref 11.5–15.5)
WBC: 6.8 10*3/uL (ref 4.0–10.5)
nRBC: 0 % (ref 0.0–0.2)

## 2019-05-04 LAB — BRAIN NATRIURETIC PEPTIDE: B Natriuretic Peptide: 51 pg/mL (ref 0.0–100.0)

## 2019-05-04 MED ORDER — HYDROMORPHONE HCL 1 MG/ML IJ SOLN
1.0000 mg | Freq: Once | INTRAMUSCULAR | Status: AC
  Administered 2019-05-04: 12:00:00 1 mg via INTRAVENOUS
  Filled 2019-05-04: qty 1

## 2019-05-04 MED ORDER — IOHEXOL 300 MG/ML  SOLN
100.0000 mL | Freq: Once | INTRAMUSCULAR | Status: AC | PRN
  Administered 2019-05-04: 100 mL via INTRAVENOUS

## 2019-05-04 MED ORDER — OXYCODONE-ACETAMINOPHEN 10-325 MG PO TABS: 1.0000 | ORAL_TABLET | Freq: Four times a day (QID) | ORAL | 0 refills | Status: AC | PRN

## 2019-05-04 MED ORDER — KETOROLAC TROMETHAMINE 30 MG/ML IJ SOLN
15.0000 mg | Freq: Once | INTRAMUSCULAR | Status: AC
  Administered 2019-05-04: 15 mg via INTRAVENOUS
  Filled 2019-05-04: qty 1
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
## 2019-05-04 NOTE — Discharge Instructions (Signed)
Take the medications as needed for pain, you can try supplementing with some ibuprofen or Naprosyn, follow-up with your primary care doctor to follow-up on the incidental cyst and nodule findings on your ovary and adrenal gland, I did place an order for home health to see if we can help arrange for some therapy.

## 2019-05-06 ENCOUNTER — Other Ambulatory Visit: Payer: PPO

## 2019-05-15 ENCOUNTER — Other Ambulatory Visit: Payer: Self-pay

## 2019-05-15 DIAGNOSIS — Z20822 Contact with and (suspected) exposure to covid-19: Secondary | ICD-10-CM

## 2019-05-17 LAB — NOVEL CORONAVIRUS, NAA: SARS-CoV-2, NAA: NOT DETECTED

## 2019-05-31 DIAGNOSIS — I10 Essential (primary) hypertension: Secondary | ICD-10-CM | POA: Diagnosis not present

## 2019-05-31 DIAGNOSIS — E038 Other specified hypothyroidism: Secondary | ICD-10-CM | POA: Diagnosis not present

## 2019-05-31 DIAGNOSIS — D279 Benign neoplasm of unspecified ovary: Secondary | ICD-10-CM | POA: Diagnosis not present

## 2019-05-31 DIAGNOSIS — I8312 Varicose veins of left lower extremity with inflammation: Secondary | ICD-10-CM | POA: Diagnosis not present

## 2019-05-31 DIAGNOSIS — N390 Urinary tract infection, site not specified: Secondary | ICD-10-CM | POA: Diagnosis not present

## 2019-06-05 DIAGNOSIS — D279 Benign neoplasm of unspecified ovary: Secondary | ICD-10-CM | POA: Diagnosis not present

## 2019-06-05 DIAGNOSIS — I10 Essential (primary) hypertension: Secondary | ICD-10-CM | POA: Diagnosis not present

## 2019-06-05 DIAGNOSIS — N390 Urinary tract infection, site not specified: Secondary | ICD-10-CM | POA: Diagnosis not present
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
## 2019-07-15 ENCOUNTER — Ambulatory Visit (INDEPENDENT_AMBULATORY_CARE_PROVIDER_SITE_OTHER): Payer: Medicare HMO | Admitting: Cardiology

## 2019-07-15 ENCOUNTER — Other Ambulatory Visit: Payer: Self-pay

## 2019-07-15 ENCOUNTER — Encounter: Payer: Self-pay | Admitting: Cardiology

## 2019-07-15 VITALS — BP 120/67 | HR 64 | Temp 94.3°F | Ht 64.0 in | Wt 224.0 lb

## 2019-07-15 DIAGNOSIS — I10 Essential (primary) hypertension: Secondary | ICD-10-CM | POA: Diagnosis not present

## 2019-07-15 DIAGNOSIS — E7800 Pure hypercholesterolemia, unspecified: Secondary | ICD-10-CM | POA: Diagnosis not present

## 2019-07-15 NOTE — Patient Instructions (Signed)

## 2019-08-19 ENCOUNTER — Encounter: Payer: Self-pay | Admitting: Podiatry

## 2019-08-19 ENCOUNTER — Ambulatory Visit (INDEPENDENT_AMBULATORY_CARE_PROVIDER_SITE_OTHER): Payer: Medicare HMO

## 2019-08-19 ENCOUNTER — Ambulatory Visit: Payer: Medicare HMO | Admitting: Podiatry

## 2019-08-19 ENCOUNTER — Other Ambulatory Visit: Payer: Self-pay

## 2019-08-19 VITALS — Temp 96.7°F | Resp 14

## 2019-08-19 DIAGNOSIS — M21961 Unspecified acquired deformity of right lower leg: Secondary | ICD-10-CM

## 2019-08-19 DIAGNOSIS — M21619 Bunion of unspecified foot: Secondary | ICD-10-CM

## 2019-08-19 DIAGNOSIS — M2041 Other hammer toe(s) (acquired), right foot: Secondary | ICD-10-CM

## 2019-08-19 DIAGNOSIS — M779 Enthesopathy, unspecified: Secondary | ICD-10-CM

## 2019-08-19 DIAGNOSIS — M778 Other enthesopathies, not elsewhere classified: Secondary | ICD-10-CM | POA: Diagnosis not present

## 2019-08-20 NOTE — Progress Notes (Signed)
Subjective: 79 year old female presents the office today for continued pain on both feet.  She points to submetatarsal 2 where she is majority pain with the right side >> left.  She denies any recent injury.  The orthotics were comfortable but she feels that the need to be redone.  She previously had an injection in the right foot in the same area several years ago which was helpful.  She denies any recent injury or trauma.  No radiating pain or weakness.  No numbness or tingling.  Findings are also tender.  She is tried offloading and shoe modification with any significant provement she is interested in surgical intervention.  Denies any systemic complaints such as fevers, chills, nausea, vomiting. No acute changes since last appointment, and no other complaints at this time.   Objective: AAO x3, NAD DP/PT pulses palpable bilaterally, CRT less than 3 seconds Bunion deformities present bilaterally.  Hammertoe deformity present of the second digit with the right side worse than left.  Tenderness submetatarsal 2 again with the right side worse than left.  Flatfoot deformity is present.  No open lesions or pre-ulcerative lesions.  No pain with calf compression, swelling, warmth, erythema  Assessment: Second MPJ capsulitis; bunion deformity with hammertoe and elongated second metatarsal  Plan: -All treatment options discussed with the patient including all alternatives, risks, complications.  -I did remold her for inserts however were going to refurbish the inserts and I molded her just in case they needed him.  We marked the areas of pain submetatarsal 2.  I left the offload subtwo as well as to do a new top-cover.  Discussed adding lateral wedges she is rolling out over and hold off on that for now. -Steroid injection performed to the second MPJ, second interspace.  Area cleaned with alcohol, Betadine.  Mixture of 1 cc Kenalog 10, 0.5 cc of Marcaine plain, 0.5 cc of lidocaine plain was infiltrated into  the area of maximal tenderness without complications.  Postinjection care discussed. -She was consider surgery.  We will get a quote for surgery which would include also bunionectomy with second metatarsal osteotomy and hammertoe repair. This was given to our insurance department today.  -Patient encouraged to call the office with any questions, concerns, change in symptoms.   Trula Slade DPM

## 2019-08-29 ENCOUNTER — Ambulatory Visit: Payer: Medicare HMO | Admitting: Adult Health

## 2019-08-29 ENCOUNTER — Encounter: Payer: Self-pay | Admitting: Adult Health

## 2019-08-29 ENCOUNTER — Other Ambulatory Visit: Payer: Self-pay

## 2019-08-29 VITALS — BP 134/80 | HR 60 | Ht 66.0 in | Wt 222.0 lb

## 2019-08-29 DIAGNOSIS — N760 Acute vaginitis: Secondary | ICD-10-CM | POA: Diagnosis not present

## 2019-08-29 DIAGNOSIS — N950 Postmenopausal bleeding: Secondary | ICD-10-CM | POA: Diagnosis not present

## 2019-08-29 DIAGNOSIS — R32 Unspecified urinary incontinence: Secondary | ICD-10-CM | POA: Insufficient documentation

## 2019-08-29 DIAGNOSIS — B9689 Other specified bacterial agents as the cause of diseases classified elsewhere: Secondary | ICD-10-CM | POA: Diagnosis not present

## 2019-08-29 DIAGNOSIS — N39498 Other specified urinary incontinence: Secondary | ICD-10-CM

## 2019-08-29 DIAGNOSIS — N898 Other specified noninflammatory disorders of vagina: Secondary | ICD-10-CM

## 2019-08-29 LAB — POCT WET PREP (WET MOUNT)
Clue Cells Wet Prep Whiff POC: NEGATIVE
WBC, Wet Prep HPF POC: POSITIVE

## 2019-08-29 MED ORDER — METRONIDAZOLE 500 MG PO TABS: 500.0000 mg | ORAL_TABLET | Freq: Two times a day (BID) | ORAL | 0 refills | Status: DC

## 2019-08-29 NOTE — Progress Notes (Signed)
  Subjective:     Patient ID: Erin Vazquez, female   DOB: 1940/12/27, 79 y.o.   MRN: [id number]  HPI Marrion is a 79 year old black female,widowed, PM still working, in complaining of vaginal discharge, with odor at times.She says she has always had a lot of discharge.She wears a pad for UI and has noticed pink on the pad, no bleeding. PCP is Dr Jeanie Cooks.   Review of Systems Has vaginal discharge and odor at times Has random UI, but esp at night Has seen pink on pad,no bleeding Not sexually active Has lots of gas  Reviewed past medical,surgical, social and family history. Reviewed medications and allergies.     Objective:   Physical Exam BP 134/80 (BP Location: Left Arm, Patient Position: Sitting, Cuff Size: Normal)   Pulse 60   Ht 5\' 6"  (1.676 m)   Wt 222 lb (100.7 kg)   BMI 35.83 kg/m   Skin warm and dry.Pelvic: external genitalia is normal in appearance no lesions, vagina: white creamy discharge without odor,no blood seen,urethra has no lesions or masses noted, cervix:smooth and bulbous, uterus: normal size, shape and contour, non tender, no masses felt, adnexa: no masses or tenderness noted. Bladder is non tender and no masses felt. Wet prep: + for clue cells and +WBCs. Examination chaperoned by Zacarias Pontes CMA.    Assessment:     1. Vaginal discharge Will rx flagyl   2. BV (bacterial vaginosis) Meds ordered this encounter  Medications  . metroNIDAZOLE (FLAGYL) 500 MG tablet    Sig: Take 1 tablet (500 mg total) by mouth 2 (two) times daily.    Dispense:  14 tablet    Refill:  0    Order Specific Question:   Supervising Provider    Answer:   Elonda Husky, LUTHER H [[id number]]    3. Other urinary incontinence Has appt 4/1 with [facility]   4. PMB (postmenopausal bleeding) Will get GYN [name] in 4 days to assess uterus and ovaries    Plan:     Return in 4 days for GYN [name], will talk when results back

## 2019-09-02 ENCOUNTER — Ambulatory Visit (INDEPENDENT_AMBULATORY_CARE_PROVIDER_SITE_OTHER): Payer: Medicare HMO

## 2019-09-02 ENCOUNTER — Other Ambulatory Visit: Payer: Self-pay

## 2019-09-02 DIAGNOSIS — N83291 Other ovarian cyst, right side: Secondary | ICD-10-CM | POA: Diagnosis not present

## 2019-09-02 DIAGNOSIS — R9389 Abnormal findings on diagnostic imaging of other specified body structures: Secondary | ICD-10-CM

## 2019-09-02 DIAGNOSIS — N950 Postmenopausal bleeding: Secondary | ICD-10-CM | POA: Diagnosis not present

## 2019-09-02 NOTE — Progress Notes (Signed)
PELVIC US TA/TV: heterogeneous anteverted uterus with mult.fibroids,(#1) posterior/mid submucosal fibroid distorting the  endometrium 3.6 x 2.4 x 3.1 cm,(#2) fundal left subserosal fibroid 2 x 1.7 x 1.6 cm,EEC 4.8 mm,two simple right ovarian cysts (#1) 3.2 x 2.9 x 3.1 cm,(#2) 1.9 x 1.5 x 1.5 cm,left ovary not visualized,no free fluid,no pain during ultrasound  Chaperone Angie

## 2019-09-03 ENCOUNTER — Telehealth: Payer: Self-pay | Admitting: Adult Health

## 2019-09-03 DIAGNOSIS — N83201 Unspecified ovarian cyst, right side: Secondary | ICD-10-CM

## 2019-09-03 NOTE — Telephone Encounter (Signed)
Left message that US showed 2 cysts on right ovary and small fibroid, Dr Glo Herring would like you to get follow up US in 6 months and a CA 125 now for baseline, and if any more bleeding will do endometrial biopsy then

## 2019-09-16 ENCOUNTER — Other Ambulatory Visit: Payer: Medicare HMO | Admitting: Orthotics

## 2019-09-17 ENCOUNTER — Ambulatory Visit
Admission: RE | Admit: 2019-09-17 | Discharge: 2019-09-17 | Disposition: A | Discharge status: Home / Self Care | Payer: Medicare HMO | Source: Ambulatory Visit | Attending: Internal Medicine | Admitting: Internal Medicine

## 2019-09-17 ENCOUNTER — Other Ambulatory Visit: Payer: Self-pay

## 2019-09-17 DIAGNOSIS — N6011 Diffuse cystic mastopathy of right breast: Secondary | ICD-10-CM

## 2019-09-26 ENCOUNTER — Telehealth: Payer: Self-pay | Admitting: Adult Health

## 2019-09-26 ENCOUNTER — Telehealth: Payer: Self-pay | Admitting: *Deleted

## 2019-09-26 NOTE — Telephone Encounter (Signed)
Will await fax on CA125,

## 2019-09-26 NOTE — Telephone Encounter (Signed)
Telephoned patient at home number and patient states had CA125 with Dr. Garwin Brothers 1/4. States does not need to repeat this. Will have [facility] fax result to [name]. Patient will follow up in 6 months with [name].

## 2019-09-26 NOTE — Telephone Encounter (Signed)
Called patient to schedule her for some labs patient states that she has already done her labs at the [facility] on 06/17/2019 with Dr. Garwin Brothers. Patient states that she does not need anymore lab work done.

## 2019-09-30 ENCOUNTER — Other Ambulatory Visit: Payer: Self-pay

## 2019-09-30 ENCOUNTER — Ambulatory Visit: Payer: Medicare HMO | Admitting: Orthotics

## 2019-09-30 DIAGNOSIS — M21961 Unspecified acquired deformity of right lower leg: Secondary | ICD-10-CM

## 2019-09-30 DIAGNOSIS — M2041 Other hammer toe(s) (acquired), right foot: Secondary | ICD-10-CM

## 2019-09-30 DIAGNOSIS — M779 Enthesopathy, unspecified: Secondary | ICD-10-CM

## 2019-09-30 NOTE — Progress Notes (Signed)
Patient came in today to pick up custom made foot orthotics.  The goals were accomplished and the patient reported no dissatisfaction with said orthotics.  Patient was advised of breakin period and how to report any issues. 

## 2019-12-26 ENCOUNTER — Ambulatory Visit (INDEPENDENT_AMBULATORY_CARE_PROVIDER_SITE_OTHER): Payer: Medicare HMO | Admitting: Family Medicine

## 2019-12-26 ENCOUNTER — Other Ambulatory Visit: Payer: Self-pay

## 2019-12-26 ENCOUNTER — Encounter (INDEPENDENT_AMBULATORY_CARE_PROVIDER_SITE_OTHER): Payer: Self-pay | Admitting: Family Medicine

## 2019-12-26 VITALS — BP 151/72 | HR 64 | Temp 97.9°F | Ht 65.0 in | Wt 224.0 lb

## 2019-12-26 DIAGNOSIS — Z0289 Encounter for other administrative examinations: Secondary | ICD-10-CM

## 2019-12-26 DIAGNOSIS — Z1331 Encounter for screening for depression: Secondary | ICD-10-CM

## 2019-12-26 DIAGNOSIS — E038 Other specified hypothyroidism: Secondary | ICD-10-CM

## 2019-12-26 DIAGNOSIS — I10 Essential (primary) hypertension: Secondary | ICD-10-CM

## 2019-12-26 DIAGNOSIS — R7303 Prediabetes: Secondary | ICD-10-CM

## 2019-12-26 DIAGNOSIS — Z6837 Body mass index (BMI) 37.0-37.9, adult: Secondary | ICD-10-CM

## 2019-12-26 DIAGNOSIS — R0602 Shortness of breath: Secondary | ICD-10-CM | POA: Diagnosis not present

## 2019-12-26 DIAGNOSIS — R5383 Other fatigue: Secondary | ICD-10-CM

## 2019-12-26 NOTE — Progress Notes (Signed)
Chief Complaint:   OBESITY Erin Vazquez (MR# [id number]) is a 79 y.o. female who presents for evaluation and treatment of obesity and related comorbidities. Current BMI is Body mass index is 37.28 kg/m. Erin Vazquez has been struggling with her weight for many years and has been unsuccessful in either losing weight, maintaining weight loss, or reaching her healthy weight goal.  Erin Vazquez is currently in the action stage of change and ready to dedicate time achieving and maintaining a healthier weight. Erin Vazquez is interested in becoming our patient and working on intensive lifestyle modifications including (but not limited to) diet and exercise for weight loss.  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  She works nights for around 50 hours per week.  She lives with her foster and adopted children, ages 20, 50, and 41.  She is [place] [employer] [place] habits were reviewed today and are as follows: Her family eats meals together, she thinks her family will eat healthier with her, her desired weight loss is 73 pounds, she started gaining weight 20 years ago, her heaviest weight ever was 230 pounds and she skips various meal frequently.  Depression Screen Erin Vazquez's Food and Mood (modified PHQ-9) score was 2.  Depression screen PHQ 2/9 12/26/2019  Decreased Interest 1  Down, Depressed, Hopeless 0  PHQ - 2 Score 1  Altered sleeping 1  Tired, decreased energy 0  Change in appetite 0  Feeling bad or failure about yourself  0  Trouble concentrating 0  Moving slowly or fidgety/restless 0  Suicidal thoughts 0  PHQ-9 Score 2  Difficult doing work/chores Not difficult at all   Subjective:   1. Other fatigue Erin Vazquez admits to daytime somnolence and reports waking up still tired. Patent has a history of symptoms of daytime fatigue, morning fatigue and snoring. Erin Vazquez generally gets 5 hours of sleep per night, and states that  she has generally restful sleep. Snoring is present. Apneic episodes are not present. Epworth Sleepiness Score is 9.  She works 3rd shift.  2. SOB (shortness of breath) on exertion Erin Vazquez notes increasing shortness of breath with exercising and seems to be worsening over time with weight gain. She notes getting out of breath sooner with activity than she used to. This has gotten worse recently. Erin Vazquez denies shortness of breath at rest or orthopnea.  3. Prediabetes Erin Vazquez has a diagnosis of prediabetes based on her elevated HgA1c and was informed this puts her at greater risk of developing diabetes. She continues to work on diet and exercise to decrease her risk of diabetes. She denies nausea or hypoglycemia.  4. Other specified hypothyroidism Erin Vazquez takes levothyroxine 75 mcg daily.  Lab Results  Component Value Date   TSH 2.429 11/27/2008   5. Essential hypertension Review: taking medications as instructed, no medication side effects noted, no chest pain on exertion, no dyspnea on exertion, no swelling of ankles.  Erin Vazquez takes losartan-HCTZ daily for blood pressure control.  BP Readings from Last 3 Encounters:  12/26/19 (!) 151/72  08/29/19 134/80  07/15/19 120/67   6. Depression screening Erin Vazquez was screened for depression as part of her new patient workup.  PHQ-9 was 2.  Assessment/Plan:   1. Other fatigue Erin Vazquez does not feel that her weight is causing her energy to be lower than it should be. Fatigue may be related to obesity, depression or many other causes. Labs will be ordered, and in the  meanwhile, Erin Vazquez will focus on self care including making healthy food choices, increasing physical activity and focusing on stress reduction.    Orders - EKG 12-Lead - Anemia panel - T3 - T4, free - TSH - VITAMIN D 25 Hydroxy (Vit-D Deficiency, Fractures)  2. SOB (shortness of breath) on exertion Erin Vazquez does feel that she gets out of breath more easily that she used to when she exercises.  Erin Vazquez shortness of breath appears to be obesity related and exercise induced. She has agreed to work on weight loss and gradually increase exercise to treat her exercise induced shortness of breath. Will continue to monitor closely.  3. Prediabetes Erin Vazquez will continue to work on weight loss, exercise, and decreasing simple carbohydrates to help decrease the risk of diabetes.   Orders - Insulin, random  4. Other specified hypothyroidism Patient with long-standing hypothyroidism, on levothyroxine therapy. She appears euthyroid. Orders and follow up as documented in patient record.  Counseling . Good thyroid control is important for overall health. Supratherapeutic thyroid levels are dangerous and will not improve weight loss results. . The correct way to take levothyroxine is fasting, with water, separated by at least 30 minutes from breakfast, and separated by more than 4 hours from calcium, iron, multivitamins, acid reflux medications (PPIs).   Orders - T3 - T4, free - TSH  5. Essential hypertension Erin Vazquez is working on healthy weight loss and exercise to improve blood pressure control. We will watch for signs of hypotension as she continues her lifestyle modifications.  Orders - CBC with Differential/Platelet - Comprehensive metabolic panel  6. Depression screening Depression screen was negative.   7. Class 2 severe obesity with serious comorbidity and body mass index (BMI) of 37.0 to 37.9 in adult, unspecified obesity type Erin Vazquez) Erin Vazquez is currently in the action stage of change and her goal is to continue with weight loss efforts. I recommend Erin Vazquez begin the structured treatment plan as follows:  She has agreed to the [employer].  Exercise goals: No exercise has been prescribed at this time.   Behavioral modification strategies: increasing lean protein intake, decreasing simple carbohydrates, increasing vegetables, increasing water intake, decreasing sodium intake and  increasing high fiber foods.  She was informed of the importance of frequent follow-up visits to maximize her success with intensive lifestyle modifications for her multiple health conditions. She was informed we would discuss her lab results at her next visit unless there is a critical issue that needs to be addressed sooner. Erin Vazquez agreed to keep her next visit at the agreed upon time to discuss these results.  Objective:   Blood pressure (!) 151/72, pulse 64, temperature 97.9 F (36.6 C), temperature source Oral, height 5\' 5"  (1.651 m), weight 224 lb (101.6 kg), SpO2 99 %. Body mass index is 37.28 kg/m.  EKG: Normal sinus rhythm, rate 65 bpm.  Indirect Calorimeter completed today shows a VO2 of 256 and a REE of 1786.  Her calculated basal metabolic rate is 1157 thus her basal metabolic rate is better than expected.  General: Cooperative, alert, well developed, in no acute distress. HEENT: Conjunctivae and lids unremarkable. Cardiovascular: Regular rhythm.  Lungs: Normal work of breathing. Neurologic: No focal deficits.   Lab Results  Component Value Date   CREATININE 0.85 05/04/2019   BUN 16 05/04/2019   NA 139 05/04/2019   K 4.2 05/04/2019   CL 105 05/04/2019   CO2 28 05/04/2019   Lab Results  Component Value Date   ALT 16 10/30/2013  AST 18 10/30/2013   ALKPHOS 91 10/30/2013   BILITOT 1.2 10/30/2013   Lab Results  Component Value Date   TSH 2.429 11/27/2008   Lab Results  Component Value Date   CHOL 172 12/26/2007   HDL 43 12/26/2007   LDLCALC 109 (H) 12/26/2007   TRIG 102 12/26/2007   CHOLHDL 4.0 Ratio 12/26/2007   Lab Results  Component Value Date   WBC 6.8 05/04/2019   HGB 10.8 (L) 05/04/2019   HCT 36.1 05/04/2019   MCV 74.9 (L) 05/04/2019   PLT 266 05/04/2019   Attestation Statements:   This is the patient's first visit at [facility]. The patient's NEW PATIENT PACKET was reviewed at length. Included in the packet: current and past  health history, medications, allergies, ROS, gynecologic history (women only), surgical history, family history, social history, weight history, weight loss surgery history (for those that have had weight loss surgery), nutritional evaluation, mood and food questionnaire, PHQ9, Epworth questionnaire, sleep habits questionnaire, patient life and health improvement goals questionnaire. These will all be scanned into the patient's chart under media.   During the visit, I independently reviewed the patient's EKG, bioimpedance scale results, and indirect calorimeter results. I used this information to tailor a meal plan for the patient that will help her to lose weight and will improve her obesity-related conditions going forward. I performed a medically necessary appropriate examination and/or evaluation. I discussed the assessment and treatment plan with the patient. The patient was provided an opportunity to ask questions and all were answered. The patient agreed with the plan and demonstrated an understanding of the instructions. Labs were ordered at this visit and will be reviewed at the next visit unless more critical results need to be addressed immediately. Clinical information was updated and documented in the EMR.   Time spent on visit including pre-visit chart review and post-visit charting and care was 60 minutes.   I, Water quality scientist, CMA, am acting as transcriptionist for Briscoe Deutscher, DO  I have reviewed the above documentation for accuracy and completeness, and I agree with the above. Briscoe Deutscher, DO

## 2019-12-27 LAB — COMPREHENSIVE METABOLIC PANEL
ALT: 18 IU/L (ref 0–32)
AST: 17 IU/L (ref 0–40)
Albumin/Globulin Ratio: 1.6 (ref 1.2–2.2)
Albumin: 4.2 g/dL (ref 3.7–4.7)
Alkaline Phosphatase: 93 IU/L (ref 48–121)
BUN/Creatinine Ratio: 14 (ref 12–28)
BUN: 11 mg/dL (ref 8–27)
Bilirubin Total: 0.7 mg/dL (ref 0.0–1.2)
CO2: 27 mmol/L (ref 20–29)
Calcium: 9.2 mg/dL (ref 8.7–10.3)
Chloride: 102 mmol/L (ref 96–106)
Creatinine, Ser: 0.79 mg/dL (ref 0.57–1.00)
GFR calc Af Amer: 83 mL/min/{1.73_m2} (ref >59)
GFR calc non Af Amer: 72 mL/min/{1.73_m2} (ref >59)
Globulin, Total: 2.7 g/dL (ref 1.5–4.5)
Glucose: 125 mg/dL — ABNORMAL HIGH (ref 65–99)
Potassium: 4.3 mmol/L (ref 3.5–5.2)
Sodium: 139 mmol/L (ref 134–144)
Total Protein: 6.9 g/dL (ref 6.0–8.5)

## 2019-12-27 LAB — ANEMIA PANEL
Ferritin: 172 ng/mL — ABNORMAL HIGH (ref 15–150)
Folate, Hemolysate: 265 ng/mL
Folate, RBC: 679 ng/mL (ref >498)
Hematocrit: 39 % (ref 34.0–46.6)
Iron Saturation: 28 % (ref 15–55)
Iron: 82 ug/dL (ref 27–139)
Retic Ct Pct: 1.1 % (ref 0.6–2.6)
Total Iron Binding Capacity: 292 ug/dL (ref 250–450)
UIBC: 210 ug/dL (ref 118–369)
Vitamin B-12: 597 pg/mL (ref 232–1245)

## 2019-12-27 LAB — TSH: TSH: 2.17 u[IU]/mL (ref 0.450–4.500)

## 2019-12-27 LAB — T3: T3, Total: 91 ng/dL (ref 71–180)

## 2019-12-27 LAB — CBC WITH DIFFERENTIAL/PLATELET
Basophils Absolute: 0 10*3/uL (ref 0.0–0.2)
Basos: 0 %
EOS (ABSOLUTE): 0.1 10*3/uL (ref 0.0–0.4)
Eos: 2 %
Hemoglobin: 12.7 g/dL (ref 11.1–15.9)
Immature Grans (Abs): 0 10*3/uL (ref 0.0–0.1)
Immature Granulocytes: 0 %
Lymphocytes Absolute: 1.6 10*3/uL (ref 0.7–3.1)
Lymphs: 20 %
MCH: 23.6 pg — ABNORMAL LOW (ref 26.6–33.0)
MCHC: 32.6 g/dL (ref 31.5–35.7)
MCV: 73 fL — ABNORMAL LOW (ref 79–97)
Monocytes Absolute: 0.5 10*3/uL (ref 0.1–0.9)
Monocytes: 6 %
Neutrophils Absolute: 5.9 10*3/uL (ref 1.4–7.0)
Neutrophils: 72 %
Platelets: 262 10*3/uL (ref 150–450)
RBC: 5.37 x10E6/uL — ABNORMAL HIGH (ref 3.77–5.28)
RDW: 14.6 % (ref 11.7–15.4)
WBC: 8.1 10*3/uL (ref 3.4–10.8)

## 2019-12-27 LAB — VITAMIN D 25 HYDROXY (VIT D DEFICIENCY, FRACTURES): Vit D, 25-Hydroxy: 24.4 ng/mL — ABNORMAL LOW (ref 30.0–100.0)

## 2019-12-27 LAB — T4, FREE: Free T4: 1.56 ng/dL (ref 0.82–1.77)

## 2019-12-27 LAB — INSULIN, RANDOM: INSULIN: 11.8 u[IU]/mL (ref 2.6–24.9)

## 2020-01-06 ENCOUNTER — Encounter (INDEPENDENT_AMBULATORY_CARE_PROVIDER_SITE_OTHER): Payer: Self-pay

## 2020-01-09 ENCOUNTER — Ambulatory Visit (INDEPENDENT_AMBULATORY_CARE_PROVIDER_SITE_OTHER): Payer: Medicare HMO | Admitting: Family Medicine

## 2020-01-23 ENCOUNTER — Ambulatory Visit (INDEPENDENT_AMBULATORY_CARE_PROVIDER_SITE_OTHER): Payer: Medicare HMO | Admitting: Family Medicine

## 2020-01-27 ENCOUNTER — Other Ambulatory Visit: Payer: Self-pay

## 2020-01-27 ENCOUNTER — Encounter (INDEPENDENT_AMBULATORY_CARE_PROVIDER_SITE_OTHER): Payer: Self-pay | Admitting: Family Medicine

## 2020-01-27 ENCOUNTER — Ambulatory Visit (INDEPENDENT_AMBULATORY_CARE_PROVIDER_SITE_OTHER): Payer: Medicare HMO | Admitting: Family Medicine

## 2020-01-27 VITALS — BP 140/72 | HR 77 | Temp 97.7°F | Ht 65.0 in | Wt 220.0 lb

## 2020-01-27 DIAGNOSIS — E559 Vitamin D deficiency, unspecified: Secondary | ICD-10-CM

## 2020-01-27 DIAGNOSIS — I10 Essential (primary) hypertension: Secondary | ICD-10-CM

## 2020-01-27 DIAGNOSIS — E038 Other specified hypothyroidism: Secondary | ICD-10-CM

## 2020-01-27 DIAGNOSIS — E611 Iron deficiency: Secondary | ICD-10-CM

## 2020-01-27 DIAGNOSIS — Z6836 Body mass index (BMI) 36.0-36.9, adult: Secondary | ICD-10-CM | POA: Diagnosis not present

## 2020-01-27 DIAGNOSIS — R7303 Prediabetes: Secondary | ICD-10-CM

## 2020-01-28 ENCOUNTER — Other Ambulatory Visit (INDEPENDENT_AMBULATORY_CARE_PROVIDER_SITE_OTHER): Payer: Self-pay | Admitting: Family Medicine

## 2020-01-28 DIAGNOSIS — E559 Vitamin D deficiency, unspecified: Secondary | ICD-10-CM

## 2020-01-28 MED ORDER — VITAMIN D (ERGOCALCIFEROL) 1.25 MG (50000 UNIT) PO CAPS: 50000.0000 [IU] | ORAL_CAPSULE | ORAL | 0 refills | Status: DC

## 2020-01-28 NOTE — Progress Notes (Signed)
Chief Complaint:   OBESITY Erin Vazquez is here to discuss her progress with her obesity treatment plan along with follow-up of her obesity related diagnoses. Erin Vazquez is on the [employer] and states she is following her eating plan approximately 70% of the time. Erin Vazquez states she is walking, cycling, and lifting weights for 60 minutes 3 times per week.  Today's visit was #: 2 Starting weight: 224 lbs Starting date: 12/26/2019 Today's weight: 220 lbs Today's date: 01/27/2020 Total lbs lost to date: 4 lbs Total lbs lost since last in-office visit: 4 lbs  Interim History: Erin Vazquez says she finds the plan easy to follow.  She appears to be eating extra fruit during the day.  She says she also drinks juice.  She says she loves orange juice and drinks it daily.  She additionally drinks some type of "nutritional drink" that is higher in protein, which she adds psyllium to in order to help keep her regular.  Subjective:   1. Essential hypertension Review: taking medications as instructed, no medication side effects noted, no chest pain on exertion, no dyspnea on exertion, no swelling of ankles.  Home blood pressures run 138 max/70s -60s.  Denies symptoms or headaches/vision changes.  She is taking Hyzaar every morning religiously.  BP Readings from Last 3 Encounters:  01/27/20 140/72  12/26/19 (!) 151/72  08/29/19 134/80   2. Other specified hypothyroidism Erin Vazquez is taking levothyroxine 75 mcg daily.  Labs are within normal limits.  Lab Results  Component Value Date   TSH 2.170 12/26/2019   3. Iron deficiency Erin Vazquez is not a vegetarian.  She does not have a history of weight loss surgery.  She has not been taking her iron regularly.  Hgb 12.7, but indices (MCV, etc.) are low.    CBC Latest Ref Rng & Units 12/26/2019 05/04/2019 04/24/2018  WBC 3.4 - 10.8 x10E3/uL 8.1 6.8 -  Hemoglobin 11.1 - 15.9 g/dL 12.7 10.8(L) 12.9  Hematocrit 34.0 - 46.6 % 39.0 36.1 38.0  Platelets 150 - 450 x10E3/uL  262 266 -   Lab Results  Component Value Date   IRON 82 12/26/2019   TIBC 292 12/26/2019   FERRITIN 172 (H) 12/26/2019   Lab Results  Component Value Date   VITAMINB12 597 12/26/2019   4. Pre-diabetes Erin Vazquez has a diagnosis of prediabetes based on her elevated HgA1c and was informed this puts her at greater risk of developing diabetes. She continues to work on diet and exercise to decrease her risk of diabetes. She denies nausea or hypoglycemia.  Fasting insulin is increased.  A1c was done at her PCP's office in the past 3 months.  She is not exactly sure what it was.  She will get [name] those values.  Lab Results  Component Value Date   INSULIN 11.8 12/26/2019   5. Vitamin D deficiency Erin Vazquez's Vitamin D level was 24.4 on 12/26/2019. She is currently taking no vitamin D supplement.    Assessment/Plan:   1. Essential hypertension Discussed labs with patient today.  Erin Vazquez is working on healthy weight loss and exercise to improve blood pressure control. We will watch for signs of hypotension as she continues her lifestyle modifications.  Continue medications per PCP.  Blood pressure at goal at home on a regular basis.  Continue prudent nutritional plan with low salt diet and weight loss.  2. Other specified hypothyroidism Discussed labs with patient today.  Patient with long-standing hypothyroidism, on levothyroxine therapy. She appears euthyroid. Orders and follow up  as documented in patient record.  Continue medications per PCP.  Labs are stable.  Will monitor alongside her PCP.  Counseling . Good thyroid control is important for overall health. Erin Vazquez thyroid levels are dangerous and will not improve weight loss results. . The correct way to take levothyroxine is fasting, with water, separated by at least 30 minutes from breakfast, and separated by more than 4 hours from calcium, iron, multivitamins, acid reflux medications (PPIs).   3. Iron deficiency Discussed labs with  patient today.  Take iron supplement daily.  Orders and follow up as documented in patient record.  Counseling . Iron is essential for our bodies to make red blood cells.  Reasons that someone may be deficient include: an iron-deficient diet (more likely in those following vegan or vegetarian diets), women with heavy menses, patients with GI disorders or poor absorption, patients that have had bariatric surgery, frequent blood donors, patients with cancer, and patients with heart disease.   Erin Vazquez foods include dark leafy greens, red and white meats, eggs, seafood, and beans.   . Certain foods and drinks prevent your body from absorbing iron properly. Avoid eating these foods in the same meal as iron-rich foods or with iron supplements. These foods include: coffee, black tea, and red wine; milk, dairy products, and foods that are high in calcium; beans and soybeans; whole grains.  . Constipation can be a side effect of iron supplementation. Increased water and fiber intake are helpful. Water goal: > 2 liters/day. Fiber goal: > 25 grams/day.  4. Pre-diabetes Discussed labs with patient today.  Erin Vazquez will continue to work on weight loss, exercise, and decreasing simple carbohydrates to help decrease the risk of diabetes.  Reviewed with her normal values vs. diabetic values.  I recommend follow-up with her PCP regarding levels and if she needs medications or not.  Asked her to get [name] her lab results and bring in to her next office visit.  5. Vitamin D deficiency Discussed labs with patient today.  Low Vitamin D level contributes to fatigue and are associated with obesity, breast, and colon cancer. She agrees to start to take prescription Vitamin D @50 ,000 IU every week and will follow-up for routine testing of Vitamin D, at least 2-3 times per year to avoid over-replacement.  Continue prudent nutritional plan, weight loss, increase weight bearing exercise.   -Start Vitamin D, Ergocalciferol,  (DRISDOL) 1.25 MG (50000 UNIT) CAPS capsule; Take 1 capsule (50,000 Units total) by mouth every 7 (seven) days.  Dispense: 4 capsule; Refill: 0  6. Class 2 severe obesity with serious comorbidity and body mass index (BMI) of 36.0 to 36.9 in adult, unspecified obesity type [facility]) Erin Vazquez is currently in the action stage of change. As such, her goal is to continue with weight loss efforts. She has agreed to the [employer].  Meal plan handout was given again since she does not have familiarity with it.  Exercise goals: As is.  Behavioral modification strategies: increasing lean protein intake, decreasing simple carbohydrates, increasing water intake, increasing high fiber foods, meal planning and cooking strategies and planning for success.  Erin Vazquez has agreed to follow-up with our clinic in 2 weeks. She was informed of the importance of frequent follow-up visits to maximize her success with intensive lifestyle modifications for her multiple health conditions.   Objective:   Blood pressure 140/72, pulse 77, temperature 97.7 F (36.5 C), height 5\' 5"  (1.651 m), weight 220 lb (99.8 kg), SpO2 97 %. Body mass index is  36.61 kg/m.  General: Cooperative, alert, well developed, in no acute distress. HEENT: Conjunctivae and lids unremarkable. Cardiovascular: Regular rhythm.  Lungs: Normal work of breathing. Neurologic: No focal deficits.   Lab Results  Component Value Date   CREATININE 0.79 12/26/2019   BUN 11 12/26/2019   NA 139 12/26/2019   K 4.3 12/26/2019   CL 102 12/26/2019   CO2 27 12/26/2019   Lab Results  Component Value Date   ALT 18 12/26/2019   AST 17 12/26/2019   ALKPHOS 93 12/26/2019   BILITOT 0.7 12/26/2019   Lab Results  Component Value Date   INSULIN 11.8 12/26/2019   Lab Results  Component Value Date   TSH 2.170 12/26/2019   Lab Results  Component Value Date   CHOL 172 12/26/2007   HDL 43 12/26/2007   LDLCALC 109 (H) 12/26/2007   TRIG 102 12/26/2007    CHOLHDL 4.0 Ratio 12/26/2007   Lab Results  Component Value Date   WBC 8.1 12/26/2019   HGB 12.7 12/26/2019   HCT 39.0 12/26/2019   MCV 73 (L) 12/26/2019   PLT 262 12/26/2019   Lab Results  Component Value Date   IRON 82 12/26/2019   TIBC 292 12/26/2019   FERRITIN 172 (H) 12/26/2019   Obesity Behavioral Intervention Documentation for Insurance:   Approximately 15 minutes were spent on the discussion below.  ASK: We discussed the diagnosis of obesity with [name] today and Dail agreed to give [name] permission to discuss obesity behavioral modification therapy today.  ASSESS: Kassandra has the diagnosis of obesity and her BMI today is 36.6. Launa is in the action stage of change.   ADVISE: Nekia was educated on the multiple health risks of obesity as well as the benefit of weight loss to improve her health. She was advised of the need for long term treatment and the importance of lifestyle modifications to improve her current health and to decrease her risk of future health problems.  AGREE: Multiple dietary modification options and treatment options were discussed and Marguerite agreed to follow the recommendations documented in the above note.  ARRANGE: Roger was educated on the importance of frequent visits to treat obesity as outlined per CMS and USPSTF guidelines and agreed to schedule her next follow up appointment today.  Attestation Statements:   Reviewed by clinician on day of visit: allergies, medications, problem list, medical history, surgical history, family history, social history, and previous encounter notes.  I, Water quality scientist, CMA, am acting as Location manager for [employer], DO.  I have reviewed the above documentation for accuracy and completeness, and I agree with the above. Mellody Dance, DO

## 2020-02-20 ENCOUNTER — Other Ambulatory Visit: Payer: Self-pay

## 2020-02-20 ENCOUNTER — Encounter (INDEPENDENT_AMBULATORY_CARE_PROVIDER_SITE_OTHER): Payer: Self-pay | Admitting: Family Medicine

## 2020-02-20 ENCOUNTER — Ambulatory Visit (INDEPENDENT_AMBULATORY_CARE_PROVIDER_SITE_OTHER): Payer: Medicare HMO | Admitting: Family Medicine

## 2020-02-20 VITALS — BP 135/74 | HR 60 | Temp 97.9°F | Ht 65.0 in | Wt 217.0 lb

## 2020-02-20 DIAGNOSIS — Z6836 Body mass index (BMI) 36.0-36.9, adult: Secondary | ICD-10-CM | POA: Diagnosis not present

## 2020-02-20 DIAGNOSIS — I10 Essential (primary) hypertension: Secondary | ICD-10-CM

## 2020-02-20 DIAGNOSIS — F418 Other specified anxiety disorders: Secondary | ICD-10-CM | POA: Diagnosis not present

## 2020-02-20 DIAGNOSIS — R7303 Prediabetes: Secondary | ICD-10-CM | POA: Diagnosis not present

## 2020-02-20 DIAGNOSIS — E038 Other specified hypothyroidism: Secondary | ICD-10-CM | POA: Diagnosis not present

## 2020-02-20 DIAGNOSIS — E66812 Obesity, class 2: Secondary | ICD-10-CM

## 2020-02-25 NOTE — Progress Notes (Signed)
Chief Complaint:   OBESITY Jacqulynn is here to discuss her progress with her obesity treatment plan along with follow-up of her obesity related diagnoses. Tameeka is on the [employer] and states she is following her eating plan approximately 50-75% of the time. Kinzlee states she is walking for 120+ minutes 1 time per week and doing cardio for 120+ minutes 7 times per week.  Today's visit was #: 3 Starting weight: 224 lbs Starting date: 12/26/2019 Today's weight: 217 lbs Today's date: 02/20/2020 Total lbs lost to date: 7 lbs Total lbs lost since last in-office visit: 3 lbs  Interim History: Saran says she is very stressed right now.  She is struggling with her adopted, special needs daughter.  She says she is still following the plan.  Assessment/Plan:   1. Pre-diabetes Not optimized. Goal is HgbA1c < 5.7 and insulin level closer to 5. Gracin will continue to work on weight loss, exercise, and decreasing simple carbohydrates to help decrease the risk of diabetes.   Lab Results  Component Value Date   INSULIN 11.8 12/26/2019   2. Essential hypertension Not optimized. Ximenna is working on healthy weight loss and exercise to improve blood pressure control. The current medical regimen is effective;  continue present plan and medications.  BP Readings from Last 3 Encounters:  02/20/20 135/74  01/27/20 140/72  12/26/19 (!) 151/72   3. Other specified hypothyroidism Latitia is taking levothyroxine 75 mcg daily.  Patient with long-standing hypothyroidism, on levothyroxine therapy. She appears euthyroid. Orders and follow up as documented in patient record.  Lab Results  Component Value Date   TSH 2.170 12/26/2019   4. Situational anxiety Behavior modification techniques were discussed today to help Brienna deal with her anxiety.  Will continue to monitor symptoms as they relate to her weight loss journey. This issue directly impacts care plan for optimization of BMI and metabolic health  as it impacts the patient's ability to make lifestyle changes.  5. Class 2 severe obesity with serious comorbidity and body mass index (BMI) of 36.0 to 36.9 in adult, unspecified obesity type [facility]) Liah is currently in the action stage of change. As such, her goal is to continue with weight loss efforts. She has agreed to the [employer].   Exercise goals: For substantial health benefits, adults should do at least 150 minutes (2 hours and 30 minutes) a week of moderate-intensity, or 75 minutes (1 hour and 15 minutes) a week of vigorous-intensity aerobic physical activity, or an equivalent combination of moderate- and vigorous-intensity aerobic activity. Aerobic activity should be performed in episodes of at least 10 minutes, and preferably, it should be spread throughout the week.  Behavioral modification strategies: increasing lean protein intake and increasing water intake.  Katalena has agreed to follow-up with our clinic in 3 weeks. She was informed of the importance of frequent follow-up visits to maximize her success with intensive lifestyle modifications for her multiple health conditions.   Objective:   Blood pressure 135/74, pulse 60, temperature 97.9 F (36.6 C), temperature source Oral, height 5\' 5"  (1.651 m), weight 217 lb (98.4 kg), SpO2 98 %. Body mass index is 36.11 kg/m.  General: Cooperative, alert, well developed, in no acute distress. HEENT: Conjunctivae and lids unremarkable. Cardiovascular: Regular rhythm.  Lungs: Normal work of breathing. Neurologic: No focal deficits.   Lab Results  Component Value Date   CREATININE 0.79 12/26/2019   BUN 11 12/26/2019   NA 139 12/26/2019   K 4.3 12/26/2019  CL 102 12/26/2019   CO2 27 12/26/2019   Lab Results  Component Value Date   ALT 18 12/26/2019   AST 17 12/26/2019   ALKPHOS 93 12/26/2019   BILITOT 0.7 12/26/2019   Lab Results  Component Value Date   INSULIN 11.8 12/26/2019   Lab Results  Component Value  Date   TSH 2.170 12/26/2019   Lab Results  Component Value Date   CHOL 172 12/26/2007   HDL 43 12/26/2007   LDLCALC 109 (H) 12/26/2007   TRIG 102 12/26/2007   CHOLHDL 4.0 Ratio 12/26/2007   Lab Results  Component Value Date   WBC 8.1 12/26/2019   HGB 12.7 12/26/2019   HCT 39.0 12/26/2019   MCV 73 (L) 12/26/2019   PLT 262 12/26/2019   Lab Results  Component Value Date   IRON 82 12/26/2019   TIBC 292 12/26/2019   FERRITIN 172 (H) 12/26/2019   Obesity Behavioral Intervention:   Approximately 15 minutes were spent on the discussion below.  ASK: We discussed the diagnosis of obesity with [name] today and Keishia agreed to give [name] permission to discuss obesity behavioral modification therapy today.  ASSESS: Keimya has the diagnosis of obesity and her BMI today is 36.1. Sharnelle is in the action stage of change.   ADVISE: Tranesha was educated on the multiple health risks of obesity as well as the benefit of weight loss to improve her health. She was advised of the need for long term treatment and the importance of lifestyle modifications to improve her current health and to decrease her risk of future health problems.  AGREE: Multiple dietary modification options and treatment options were discussed and Siham agreed to follow the recommendations documented in the above note.  ARRANGE: Apple was educated on the importance of frequent visits to treat obesity as outlined per CMS and USPSTF guidelines and agreed to schedule her next follow up appointment today.  Attestation Statements:   Reviewed by clinician on day of visit: allergies, medications, problem list, medical history, surgical history, family history, social history, and previous encounter notes.  I, Water quality scientist, CMA, am acting as transcriptionist for Briscoe Deutscher, DO  I have reviewed the above documentation for accuracy and completeness, and I agree with the above. Briscoe Deutscher, DO

## 2020-03-03 ENCOUNTER — Telehealth (INDEPENDENT_AMBULATORY_CARE_PROVIDER_SITE_OTHER): Payer: Self-pay | Admitting: Family Medicine

## 2020-03-03 ENCOUNTER — Other Ambulatory Visit (INDEPENDENT_AMBULATORY_CARE_PROVIDER_SITE_OTHER): Payer: Self-pay | Admitting: Family Medicine

## 2020-03-03 DIAGNOSIS — E559 Vitamin D deficiency, unspecified: Secondary | ICD-10-CM

## 2020-03-04 MED ORDER — VITAMIN D (ERGOCALCIFEROL) 1.25 MG (50000 UNIT) PO CAPS: 50000.0000 [IU] | ORAL_CAPSULE | ORAL | 0 refills | Status: DC

## 2020-03-04 NOTE — Telephone Encounter (Signed)
RX sent

## 2020-03-09 ENCOUNTER — Other Ambulatory Visit: Payer: Self-pay

## 2020-03-09 ENCOUNTER — Ambulatory Visit: Payer: Medicare HMO | Admitting: Podiatry

## 2020-03-09 DIAGNOSIS — M779 Enthesopathy, unspecified: Secondary | ICD-10-CM | POA: Diagnosis not present

## 2020-03-09 DIAGNOSIS — M21961 Unspecified acquired deformity of right lower leg: Secondary | ICD-10-CM | POA: Diagnosis not present

## 2020-03-11 NOTE — Progress Notes (Signed)
Subjective: 79 year old female presents the office today for possible surgical consultation. She states that she still has pain in both of her feet on the left worse MPJs as well as on the bottom of the foot transmetatarsal to the right side worse than left. She just wants to see how long should be adequate for surgery. She does not want injections today. No recent changes otherwise. Denies any systemic complaints such as fevers, chills, nausea, vomiting. No acute changes since last appointment, and no other complaints at this time.   Objective: AAO x3, NAD DP/PT pulses palpable bilaterally, CRT less than 3 seconds Significant bunions are present bilaterally. Tenderness on the first MPJs as well as the second MPJ. No area pinpoint tenderness. Flexor, extensor tendons appear to be intact. MMT 5/5. 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  -I viewed the prior x-rays with her. This point for surgical intervention to the arthritis of recommended first MPJ arthrodesis with second metatarsal osteotomy. We discussed the surgery as well as postoperative course. She will consider her options. Now she was to continue with conservative care. Continue supportive shoes, inserts and she will do steroid injection in the future if needed. -Patient encouraged to call the office with any questions, concerns, change in symptoms.   Trula Slade DPM

## 2020-03-12 ENCOUNTER — Other Ambulatory Visit: Payer: Self-pay

## 2020-03-12 ENCOUNTER — Encounter (INDEPENDENT_AMBULATORY_CARE_PROVIDER_SITE_OTHER): Payer: Self-pay | Admitting: Family Medicine

## 2020-03-12 ENCOUNTER — Telehealth (INDEPENDENT_AMBULATORY_CARE_PROVIDER_SITE_OTHER): Payer: Medicare HMO | Admitting: Family Medicine

## 2020-03-12 VITALS — Ht 65.0 in | Wt 215.0 lb

## 2020-03-12 DIAGNOSIS — R7303 Prediabetes: Secondary | ICD-10-CM | POA: Diagnosis not present

## 2020-03-12 DIAGNOSIS — E559 Vitamin D deficiency, unspecified: Secondary | ICD-10-CM | POA: Diagnosis not present

## 2020-03-12 DIAGNOSIS — E669 Obesity, unspecified: Secondary | ICD-10-CM | POA: Diagnosis not present

## 2020-03-12 DIAGNOSIS — Z6835 Body mass index (BMI) 35.0-35.9, adult: Secondary | ICD-10-CM

## 2020-03-12 DIAGNOSIS — F418 Other specified anxiety disorders: Secondary | ICD-10-CM | POA: Diagnosis not present

## 2020-03-12 NOTE — Progress Notes (Signed)
TeleHealth Visit:  Due to the COVID-19 pandemic, this visit was completed with telemedicine (audio/video) technology to reduce patient and provider exposure as well as to preserve personal protective equipment.   Jasman has verbally consented to this TeleHealth visit. The patient is located at home, the provider is located at the [facility] office. The participants in this visit include the listed provider and patient. The visit was conducted today via webex.  Chief Complaint: OBESITY Nobie is here to discuss her progress with her obesity treatment plan along with follow-up of her obesity related diagnoses. Jianna is on the [employer] and states she is following her eating plan approximately 75% of the time. Lue states she is not exercising.  Interim History: Ms. Spear is unable to come to the office today due to a viral GI illness.  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  She describes stomach cramps and diarrhea.  She is staying hydrated.  She denies any nausea or vomiting.  We reviewed the importance of electrolyte replacements and eating a bland diet during this time.  We reviewed the importance of rest.  Of note, she is still taking vitamin D for her low level, recently checked in July.  We discussed continuing for 12-week course.  We reviewed the importance of stress reduction in her life, regarding her foster children.  We will see her when she is feeling better.  Assessment/Plan:   1. Pre-diabetes Not optimized. Goal is HgbA1c < 5.7 and insulin level closer to 5. She will continue to focus on protein-rich, low simple carbohydrate foods. We reviewed the importance of hydration, regular exercise for stress reduction, and restorative sleep.   2. Vitamin D deficiency Not at goal. Optimal goal > 50 ng/dL. There is also evidence to support a goal of >70 ng/dL in patients with cancer and heart disease. Plan:  Continue Vitamin D @50 ,000 IU every week with follow-up for routine testing of Vitamin D at least 2-3 times per year to avoid over-replacement.  3. Situational anxiety Worsened. Will continue to monitor symptoms as they relate to her weight loss journey. This issue directly impacts care plan for optimization of BMI and metabolic health as it impacts the patient's ability to make lifestyle changes.  4. Class 2 obesity without serious comorbidity with body mass index (BMI) of 35.0 to 35.9 in adult, unspecified obesity type  Ramaya is currently in the action stage of change. As such, her goal is to continue with weight loss efforts. She has agreed to the [employer].   Exercise goals: For substantial health benefits, adults should do at least 150 minutes (2 hours and 30 minutes) a week of moderate-intensity, or 75 minutes (1 hour and 15 minutes) a week of vigorous-intensity aerobic physical activity, or an equivalent combination of moderate- and vigorous-intensity aerobic activity. Aerobic activity should be performed in episodes of at least 10 minutes, and preferably, it should be spread throughout the week. Adults should also include muscle-strengthening activities that involve all major muscle groups on 2 or more days a week.  Behavioral modification strategies: increasing lean protein intake and increasing water intake.  Tiaunna has agreed to follow-up with our clinic in 2 weeks. She was informed of the importance of frequent follow-up visits to maximize her success with intensive lifestyle modifications for her multiple health conditions.  Objective:   VITALS: Per patient if applicable, see vitals. GENERAL: Alert and  in no acute distress. CARDIOPULMONARY: No increased WOB. Speaking in clear sentences.  PSYCH: Pleasant and cooperative. Speech normal rate and rhythm. Affect is appropriate. Insight and judgement are appropriate. Attention is focused, linear, and appropriate.  NEURO: Oriented as  arrived to appointment on time with no prompting.   Lab Results  Component Value Date   CREATININE 0.79 12/26/2019   BUN 11 12/26/2019   NA 139 12/26/2019   K 4.3 12/26/2019   CL 102 12/26/2019   CO2 27 12/26/2019   Lab Results  Component Value Date   ALT 18 12/26/2019   AST 17 12/26/2019   ALKPHOS 93 12/26/2019   BILITOT 0.7 12/26/2019   No results found for: HGBA1C Lab Results  Component Value Date   INSULIN 11.8 12/26/2019   Lab Results  Component Value Date   TSH 2.170 12/26/2019   Lab Results  Component Value Date   CHOL 172 12/26/2007   HDL 43 12/26/2007   LDLCALC 109 (H) 12/26/2007   TRIG 102 12/26/2007   CHOLHDL 4.0 Ratio 12/26/2007   Lab Results  Component Value Date   WBC 8.1 12/26/2019   HGB 12.7 12/26/2019   HCT 39.0 12/26/2019   MCV 73 (L) 12/26/2019   PLT 262 12/26/2019   Lab Results  Component Value Date   IRON 82 12/26/2019   TIBC 292 12/26/2019   FERRITIN 172 (H) 12/26/2019   Attestation Statements:   Reviewed by clinician on day of visit: allergies, medications, problem list, medical history, surgical history, family history, social history, and previous encounter notes.  Time spent on visit including pre-visit chart review and post-visit charting and care was 20 minutes.

## 2020-03-29 ENCOUNTER — Other Ambulatory Visit (INDEPENDENT_AMBULATORY_CARE_PROVIDER_SITE_OTHER): Payer: Self-pay | Admitting: Family Medicine

## 2020-03-29 DIAGNOSIS — E559 Vitamin D deficiency, unspecified: Secondary | ICD-10-CM

## 2020-03-31 ENCOUNTER — Ambulatory Visit (INDEPENDENT_AMBULATORY_CARE_PROVIDER_SITE_OTHER): Payer: Medicare HMO | Admitting: Family Medicine

## 2020-03-31 ENCOUNTER — Other Ambulatory Visit: Payer: Self-pay

## 2020-03-31 ENCOUNTER — Encounter (INDEPENDENT_AMBULATORY_CARE_PROVIDER_SITE_OTHER): Payer: Self-pay | Admitting: Family Medicine

## 2020-03-31 VITALS — BP 118/68 | HR 67 | Temp 97.8°F | Ht 65.0 in | Wt 210.0 lb

## 2020-03-31 DIAGNOSIS — I10 Essential (primary) hypertension: Secondary | ICD-10-CM

## 2020-03-31 DIAGNOSIS — Z6835 Body mass index (BMI) 35.0-35.9, adult: Secondary | ICD-10-CM | POA: Diagnosis not present

## 2020-03-31 DIAGNOSIS — E669 Obesity, unspecified: Secondary | ICD-10-CM | POA: Diagnosis not present

## 2020-03-31 DIAGNOSIS — E611 Iron deficiency: Secondary | ICD-10-CM | POA: Diagnosis not present

## 2020-03-31 DIAGNOSIS — E038 Other specified hypothyroidism: Secondary | ICD-10-CM

## 2020-03-31 DIAGNOSIS — E559 Vitamin D deficiency, unspecified: Secondary | ICD-10-CM

## 2020-03-31 DIAGNOSIS — R7303 Prediabetes: Secondary | ICD-10-CM | POA: Diagnosis not present

## 2020-03-31 DIAGNOSIS — E538 Deficiency of other specified B group vitamins: Secondary | ICD-10-CM

## 2020-03-31 DIAGNOSIS — F418 Other specified anxiety disorders: Secondary | ICD-10-CM

## 2020-04-01 LAB — COMPREHENSIVE METABOLIC PANEL
ALT: 19 IU/L (ref 0–32)
AST: 25 IU/L (ref 0–40)
Albumin/Globulin Ratio: 1.8 (ref 1.2–2.2)
Albumin: 4.4 g/dL (ref 3.7–4.7)
Alkaline Phosphatase: 81 IU/L (ref 44–121)
BUN/Creatinine Ratio: 19 (ref 12–28)
BUN: 16 mg/dL (ref 8–27)
Bilirubin Total: 0.7 mg/dL (ref 0.0–1.2)
CO2: 25 mmol/L (ref 20–29)
Calcium: 9.2 mg/dL (ref 8.7–10.3)
Chloride: 102 mmol/L (ref 96–106)
Creatinine, Ser: 0.84 mg/dL (ref 0.57–1.00)
GFR calc Af Amer: 77 mL/min/{1.73_m2} (ref >59)
GFR calc non Af Amer: 67 mL/min/{1.73_m2} (ref >59)
Globulin, Total: 2.4 g/dL (ref 1.5–4.5)
Glucose: 119 mg/dL — ABNORMAL HIGH (ref 65–99)
Potassium: 3.8 mmol/L (ref 3.5–5.2)
Sodium: 141 mmol/L (ref 134–144)
Total Protein: 6.8 g/dL (ref 6.0–8.5)

## 2020-04-01 LAB — VITAMIN D 25 HYDROXY (VIT D DEFICIENCY, FRACTURES): Vit D, 25-Hydroxy: 45 ng/mL (ref 30.0–100.0)

## 2020-04-01 LAB — CBC WITH DIFFERENTIAL/PLATELET
Basophils Absolute: 0 10*3/uL (ref 0.0–0.2)
Basos: 0 %
EOS (ABSOLUTE): 0.1 10*3/uL (ref 0.0–0.4)
Eos: 2 %
Hemoglobin: 12.2 g/dL (ref 11.1–15.9)
Immature Grans (Abs): 0 10*3/uL (ref 0.0–0.1)
Immature Granulocytes: 0 %
Lymphocytes Absolute: 1.8 10*3/uL (ref 0.7–3.1)
Lymphs: 23 %
MCH: 22 pg — ABNORMAL LOW (ref 26.6–33.0)
MCHC: 30.1 g/dL — ABNORMAL LOW (ref 31.5–35.7)
MCV: 73 fL — ABNORMAL LOW (ref 79–97)
Monocytes Absolute: 0.5 10*3/uL (ref 0.1–0.9)
Monocytes: 6 %
Neutrophils Absolute: 5.3 10*3/uL (ref 1.4–7.0)
Neutrophils: 69 %
Platelets: 260 10*3/uL (ref 150–450)
RBC: 5.54 x10E6/uL — ABNORMAL HIGH (ref 3.77–5.28)
RDW: 14.5 % (ref 11.7–15.4)
WBC: 7.7 10*3/uL (ref 3.4–10.8)

## 2020-04-01 LAB — ANEMIA PANEL
Ferritin: 187 ng/mL — ABNORMAL HIGH (ref 15–150)
Folate, Hemolysate: 322 ng/mL
Folate, RBC: 795 ng/mL (ref >498)
Hematocrit: 40.5 % (ref 34.0–46.6)
Iron Saturation: 30 % (ref 15–55)
Iron: 90 ug/dL (ref 27–139)
Retic Ct Pct: 1.1 % (ref 0.6–2.6)
Total Iron Binding Capacity: 299 ug/dL (ref 250–450)
UIBC: 209 ug/dL (ref 118–369)
Vitamin B-12: 578 pg/mL (ref 232–1245)

## 2020-04-01 LAB — TSH: TSH: 2.47 u[IU]/mL (ref 0.450–4.500)

## 2020-04-01 LAB — T4, FREE: Free T4: 1.6 ng/dL (ref 0.82–1.77)

## 2020-04-02 NOTE — Progress Notes (Signed)
Chief Complaint:   OBESITY Erin Vazquez is here to discuss her progress with her obesity treatment plan along with follow-up of her obesity related diagnoses. Erin Vazquez is on the [employer] and states she is following her eating plan approximately 50% of the time. Erin Vazquez states she is walking and doing cardio for 30 minutes 2-3 times per week.  Today's visit was #: 5 Starting weight: 224 lbs Starting date: 12/26/2019 Today's weight: 210 lbs Today's date: 03/31/2020 Total lbs lost to date: 14 lbs Total lbs lost since last in-office visit: 7 lbs Total weight loss percentage to date: -6.25%  Interim History: Erin Vazquez will be releasing guardianship of her foster child due to too many behavioral issues/triggers autistic adopted daughter.  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  Assessment/Plan:   1. Pre-diabetes Improving. Goal is HgbA1c < 5.7 and insulin level closer to 5. She will continue to focus on protein-rich, low simple carbohydrate foods. We reviewed the importance of hydration, regular exercise for stress reduction, and restorative sleep.   Lab Results  Component Value Date   INSULIN 11.8 12/26/2019   - CBC with Differential/Platelet - Comprehensive metabolic panel - Insulin, Free (Bioactive) - Vitamin B12  2. Vitamin D deficiency Current vitamin D is 24.4, tested on 12/26/2019. Not at goal. Optimal goal > 50 ng/dL.   Plan: Continue Vitamin D @50 ,000 IU every week with follow-up for routine testing of Vitamin D at least 2-3 times per year to avoid over-replacement.  - VITAMIN D 25 Hydroxy (Vit-D Deficiency, Fractures)  3. Essential hypertension At goal. Medications: Hyzaar 50-12.5 two tablets daily. We will monitor for hypotension with continued weight loss.   BP Readings from Last 3 Encounters:  03/31/20 118/68  02/20/20 135/74  01/27/20 140/72   Lab Results  Component Value Date   CREATININE 0.84 03/31/2020   4. Iron  deficiency Erin Vazquez is taking an OTC iron supplement daily.  Nutrition: Iron-rich foods include dark leafy greens, red and white meats, eggs, seafood, and beans.  Certain foods and drinks prevent your body from absorbing iron properly. Avoid eating these foods in the same meal as iron-rich foods or with iron supplements. These foods include: coffee, black tea, and red wine; milk, dairy products, and foods that are high in calcium; beans and soybeans; whole grains. Constipation can be a side effect of iron supplementation. Increased water and fiber intake are helpful. Water goal: > 2 liters/day. Fiber goal: > 25 grams/day.  - Anemia panel  5. Other specified hypothyroidism Erin Vazquez is taking levothyroxine 75 mcg daily. She appears euthyroid. The current medical regimen is effective;  continue present plan and medications.  Lab Results  Component Value Date   TSH 2.470 03/31/2020   - TSH - T4, free  6. B12 deficiency  Lab Results  Component Value Date   VITAMINB12 578 03/31/2020   - Vitamin B12  7. Situational anxiety Behavior modification techniques were discussed today to help [name] deal with her anxiety.  We will continue to monitor symptoms as they relate to her weight loss journey.  8. Class 2 obesity without serious comorbidity with body mass index (BMI) of 35.0 to 35.9 in adult, unspecified obesity type  [name] is currently in the action stage of change. As such, her goal is to continue with weight loss efforts. She has agreed to the [employer].   Exercise goals: For substantial health benefits, adults should do at least 150 minutes (2 hours  and 30 minutes) a week of moderate-intensity, or 75 minutes (1 hour and 15 minutes) a week of vigorous-intensity aerobic physical activity, or an equivalent combination of moderate- and vigorous-intensity aerobic activity. Aerobic activity should be performed in episodes of at least 10 minutes, and preferably, it should be spread throughout the  week.  Behavioral modification strategies: increasing lean protein intake, decreasing simple carbohydrates, increasing vegetables and increasing water intake.  Erin Vazquez has agreed to follow-up with our clinic in 3 weeks. She was informed of the importance of frequent follow-up visits to maximize her success with intensive lifestyle modifications for her multiple health conditions.   Objective:   Blood pressure 118/68, pulse 67, temperature 97.8 F (36.6 C), temperature source Oral, height 5\' 5"  (1.651 m), weight 210 lb (95.3 kg), SpO2 99 %. Body mass index is 34.95 kg/m.  General: Cooperative, alert, well developed, in no acute distress. HEENT: Conjunctivae and lids unremarkable. Cardiovascular: Regular rhythm.  Lungs: Normal work of breathing. Neurologic: No focal deficits.   Lab Results  Component Value Date   CREATININE 0.84 03/31/2020   BUN 16 03/31/2020   NA 141 03/31/2020   K 3.8 03/31/2020   CL 102 03/31/2020   CO2 25 03/31/2020   Lab Results  Component Value Date   ALT 19 03/31/2020   AST 25 03/31/2020   ALKPHOS 81 03/31/2020   BILITOT 0.7 03/31/2020   Lab Results  Component Value Date   INSULIN 11.8 12/26/2019   Lab Results  Component Value Date   TSH 2.470 03/31/2020   Lab Results  Component Value Date   CHOL 172 12/26/2007   HDL 43 12/26/2007   LDLCALC 109 (H) 12/26/2007   TRIG 102 12/26/2007   CHOLHDL 4.0 Ratio 12/26/2007   Lab Results  Component Value Date   WBC 7.7 03/31/2020   HGB 12.2 03/31/2020   HCT 40.5 03/31/2020   MCV 73 (L) 03/31/2020   PLT 260 03/31/2020   Lab Results  Component Value Date   IRON 90 03/31/2020   TIBC 299 03/31/2020   FERRITIN 187 (H) 03/31/2020   Obesity Behavioral Intervention:   Approximately 15 minutes were spent on the discussion below.  ASK: We discussed the diagnosis of obesity with Erin Vazquez today and Erin Vazquez agreed to give [name] permission to discuss obesity behavioral modification therapy  today.  ASSESS: [name] has the diagnosis of obesity and her BMI today is 35.0. Erin Vazquez is in the action stage of change.   ADVISE: [name] was educated on the multiple health risks of obesity as well as the benefit of weight loss to improve her health. She was advised of the need for long term treatment and the importance of lifestyle modifications to improve her current health and to decrease her risk of future health problems.  AGREE: Multiple dietary modification options and treatment options were discussed and Tequila agreed to follow the recommendations documented in the above note.  ARRANGE: Edilia was educated on the importance of frequent visits to treat obesity as outlined per CMS and USPSTF guidelines and agreed to schedule her next follow up appointment today.  Attestation Statements:   Reviewed by clinician on day of visit: allergies, medications, problem list, medical history, surgical history, family history, social history, and previous encounter notes.  I, Water quality scientist, CMA, am acting as transcriptionist for Briscoe Deutscher, DO  I have reviewed the above documentation for accuracy and completeness, and I agree with the above. Briscoe Deutscher, DO

## 2020-04-06 ENCOUNTER — Encounter (INDEPENDENT_AMBULATORY_CARE_PROVIDER_SITE_OTHER): Payer: Self-pay | Admitting: Family Medicine

## 2020-04-21 ENCOUNTER — Other Ambulatory Visit: Payer: Self-pay

## 2020-04-21 ENCOUNTER — Encounter (INDEPENDENT_AMBULATORY_CARE_PROVIDER_SITE_OTHER): Payer: Self-pay | Admitting: Family Medicine

## 2020-04-21 ENCOUNTER — Ambulatory Visit (INDEPENDENT_AMBULATORY_CARE_PROVIDER_SITE_OTHER): Payer: Medicare HMO | Admitting: Family Medicine

## 2020-04-21 VITALS — BP 130/73 | HR 55 | Temp 98.1°F | Ht 65.0 in | Wt 210.0 lb

## 2020-04-21 DIAGNOSIS — E669 Obesity, unspecified: Secondary | ICD-10-CM | POA: Diagnosis not present

## 2020-04-21 DIAGNOSIS — F418 Other specified anxiety disorders: Secondary | ICD-10-CM | POA: Diagnosis not present

## 2020-04-21 DIAGNOSIS — R7303 Prediabetes: Secondary | ICD-10-CM | POA: Diagnosis not present

## 2020-04-21 DIAGNOSIS — E559 Vitamin D deficiency, unspecified: Secondary | ICD-10-CM | POA: Diagnosis not present

## 2020-04-21 DIAGNOSIS — Z6835 Body mass index (BMI) 35.0-35.9, adult: Secondary | ICD-10-CM | POA: Diagnosis not present

## 2020-04-21 DIAGNOSIS — E66812 Obesity, class 2: Secondary | ICD-10-CM

## 2020-04-22 ENCOUNTER — Encounter (INDEPENDENT_AMBULATORY_CARE_PROVIDER_SITE_OTHER): Payer: Self-pay | Admitting: Family Medicine

## 2020-04-22 MED ORDER — VITAMIN D (ERGOCALCIFEROL) 1.25 MG (50000 UNIT) PO CAPS: 50000.0000 [IU] | ORAL_CAPSULE | ORAL | 0 refills | Status: DC

## 2020-04-23 NOTE — Progress Notes (Signed)
Chief Complaint:   OBESITY Dearra is here to discuss her progress with her obesity treatment plan along with follow-up of her obesity related diagnoses.   Today's visit was #: 6 Starting weight: 224 lbs Starting date: 12/26/2019 Today's weight: 210 lbs Today's date: 04/21/2020 Total lbs lost to date: 14 lbs Body mass index is 34.95 kg/m.  Total weight loss percentage to date: -6.25%  Interim History: Sinaya says she has been working with a Clinical research associate.  Her foster child is now out of the house.  She is finding peace.    Bioimpedence shows increased muscle and decreased fat.  Nutrition Plan: the [place] for 70% of the time.  Hunger is moderately controlled controlled. Cravings are moderately controlled controlled.  Activity: Strength training for 30 minutes 6-7 times per week. Sleep: Sleep is restful.   Assessment/Plan:   1. Vitamin D deficiency Not at goal. Current vitamin D is 45.0, tested on 03/31/2020. Optimal goal > 50 ng/dL.   Plan:  [x]   Continue Vitamin D @50 ,000 IU every week. []   Continue home supplement daily. [x]   Follow-up for routine testing of Vitamin D at least 2-3 times per year to avoid over-replacement.  - Refill Vitamin D, Ergocalciferol, (DRISDOL) 1.25 MG (50000 UNIT) CAPS capsule; Take 1 capsule (50,000 Units total) by mouth every 7 (seven) days.  Dispense: 4 capsule; Refill: 0  2. Prediabetes Goal is HgbA1c < 5.7.  She will continue to focus on protein-rich, low simple carbohydrate foods. We reviewed the importance of hydration, regular exercise for stress reduction, and restorative sleep.   Lab Results  Component Value Date   INSULIN 11.8 12/26/2019   3. Situational anxiety Improving. We will continue to monitor symptoms as they relate to her weight loss journey.   4. Class 2 obesity without serious comorbidity with body mass index (BMI) of 35.0 to 35.9 in adult, unspecified obesity type  Course: Francelia is currently in the action stage of  change. As such, her goal is to continue with weight loss efforts.   Nutrition goals: She has agreed to the [employer].   Exercise goals: For substantial health benefits, adults should do at least 150 minutes (2 hours and 30 minutes) a week of moderate-intensity, or 75 minutes (1 hour and 15 minutes) a week of vigorous-intensity aerobic physical activity, or an equivalent combination of moderate- and vigorous-intensity aerobic activity. Aerobic activity should be performed in episodes of at least 10 minutes, and preferably, it should be spread throughout the week.  Behavioral modification strategies: increasing lean protein intake, decreasing simple carbohydrates, increasing vegetables, increasing water intake and decreasing liquid calories.  Adelynne has agreed to follow-up with our clinic in 3 weeks. She was informed of the importance of frequent follow-up visits to maximize her success with intensive lifestyle modifications for her multiple health conditions.   Objective:   Blood pressure 130/73, pulse (!) 55, temperature 98.1 F (36.7 C), temperature source Oral, height 5\' 5"  (1.651 m), weight 210 lb (95.3 kg), SpO2 99 %. Body mass index is 34.95 kg/m.  General: Cooperative, alert, well developed, in no acute distress. HEENT: Conjunctivae and lids unremarkable. Cardiovascular: Regular rhythm.  Lungs: Normal work of breathing. Neurologic: No focal deficits.   Lab Results  Component Value Date   CREATININE 0.84 03/31/2020   BUN 16 03/31/2020   NA 141 03/31/2020   K 3.8 03/31/2020   CL 102 03/31/2020   CO2 25 03/31/2020   Lab Results  Component Value Date  ALT 19 03/31/2020   AST 25 03/31/2020   ALKPHOS 81 03/31/2020   BILITOT 0.7 03/31/2020   Lab Results  Component Value Date   INSULIN 11.8 12/26/2019   Lab Results  Component Value Date   TSH 2.470 03/31/2020   Lab Results  Component Value Date   CHOL 172 12/26/2007   HDL 43 12/26/2007   LDLCALC 109 (H)  12/26/2007   TRIG 102 12/26/2007   CHOLHDL 4.0 Ratio 12/26/2007   Lab Results  Component Value Date   WBC 7.7 03/31/2020   HGB 12.2 03/31/2020   HCT 40.5 03/31/2020   MCV 73 (L) 03/31/2020   PLT 260 03/31/2020   Lab Results  Component Value Date   IRON 90 03/31/2020   TIBC 299 03/31/2020   FERRITIN 187 (H) 03/31/2020   Obesity Behavioral Intervention:   Approximately 15 minutes were spent on the discussion below.  ASK: We discussed the diagnosis of obesity with [name] today and Sharla agreed to give [name] permission to discuss obesity behavioral modification therapy today.  ASSESS: Angi has the diagnosis of obesity and her BMI today is 35.0. Latrelle is in the action stage of change.   ADVISE: Glorya was educated on the multiple health risks of obesity as well as the benefit of weight loss to improve her health. She was advised of the need for long term treatment and the importance of lifestyle modifications to improve her current health and to decrease her risk of future health problems.  AGREE: Multiple dietary modification options and treatment options were discussed and Frenchie agreed to follow the recommendations documented in the above note.  ARRANGE: Kashawn was educated on the importance of frequent visits to treat obesity as outlined per CMS and USPSTF guidelines and agreed to schedule her next follow up appointment today.  Attestation Statements:   Reviewed by clinician on day of visit: allergies, medications, problem list, medical history, surgical history, family history, social history, and previous encounter notes.  I, Water quality scientist, CMA, am acting as transcriptionist for Briscoe Deutscher, DO  I have reviewed the above documentation for accuracy and completeness, and I agree with the above. Briscoe Deutscher, DO

## 2020-04-28 ENCOUNTER — Ambulatory Visit: Payer: Medicare HMO | Admitting: Podiatry

## 2020-05-18 ENCOUNTER — Ambulatory Visit (INDEPENDENT_AMBULATORY_CARE_PROVIDER_SITE_OTHER): Payer: Medicare HMO | Admitting: Family Medicine

## 2020-05-18 ENCOUNTER — Encounter (INDEPENDENT_AMBULATORY_CARE_PROVIDER_SITE_OTHER): Payer: Self-pay | Admitting: Family Medicine

## 2020-05-18 ENCOUNTER — Other Ambulatory Visit: Payer: Self-pay

## 2020-05-18 VITALS — BP 127/73 | HR 55 | Temp 97.8°F | Ht 65.0 in | Wt 208.0 lb

## 2020-05-18 DIAGNOSIS — I10 Essential (primary) hypertension: Secondary | ICD-10-CM | POA: Diagnosis not present

## 2020-05-18 DIAGNOSIS — E669 Obesity, unspecified: Secondary | ICD-10-CM

## 2020-05-18 DIAGNOSIS — E038 Other specified hypothyroidism: Secondary | ICD-10-CM

## 2020-05-18 DIAGNOSIS — E559 Vitamin D deficiency, unspecified: Secondary | ICD-10-CM | POA: Diagnosis not present

## 2020-05-18 DIAGNOSIS — Z6834 Body mass index (BMI) 34.0-34.9, adult: Secondary | ICD-10-CM | POA: Diagnosis not present

## 2020-05-18 MED ORDER — VITAMIN D (ERGOCALCIFEROL) 1.25 MG (50000 UNIT) PO CAPS: 50000.0000 [IU] | ORAL_CAPSULE | ORAL | 0 refills | Status: DC

## 2020-05-18 NOTE — Progress Notes (Signed)
Chief Complaint:   OBESITY Erin Vazquez is here to discuss her progress with her obesity treatment plan along with follow-up of her obesity related diagnoses.   Today's visit was #: 7 Starting weight: 224 lbs Starting date: 12/26/2019 Today's weight: 208 lbs Today's date: 05/18/2020 Total lbs lost to date: 16 lbs Body mass index is 34.61 kg/m.  Total weight loss percentage to date: -7.14%  Interim History: Erin Vazquez is down 16 pounds today.  She says, "I ate like a pig" during Thanksgiving.  "I felt like I was alive for four days."  She saw an old love last week. Nutrition Plan: the [place] for 100% of the time.  Activity: Working with a Physiological scientist for 30 minutes 3 times per week.  Assessment/Plan:   1. Vitamin D deficiency At goal. Current vitamin D is 45.0, tested on 03/31/2020. Optimal goal > 50 ng/dL.   Plan:  [x]   Continue Vitamin D @50 ,000 IU every week. []   Continue home supplement daily. [x]   Follow-up for routine testing of Vitamin D at least 2-3 times per year to avoid over-replacement.  - Refill Vitamin D, Ergocalciferol, (DRISDOL) 1.25 MG (50000 UNIT) CAPS capsule; Take 1 capsule (50,000 Units total) by mouth every 7 (seven) days.  Dispense: 4 capsule; Refill: 0  2. Other specified hypothyroidism Course: Controlled. Medication: levothyroxine 75 mcg daily.   Lab Results  Component Value Date   TSH 2.470 03/31/2020   Plan: Patient was instructed not to take MVM or iron within 4 hours of taking thyroid medications. We will continue to monitor symptoms as they relate to her weight loss journey.  3. Essential hypertension At goal. Medications: Hyzaar 50-12.5 two tablets daily.   Plan: Avoid buying foods that are: processed, frozen, or prepackaged to avoid excess salt. We will continue to monitor symptoms as they relate to her weight loss journey.  BP Readings from Last 3 Encounters:  05/18/20 127/73  04/21/20 130/73  03/31/20 118/68   Lab Results   Component Value Date   CREATININE 0.84 03/31/2020   4. Class 1 obesity with serious comorbidity and body mass index (BMI) of 34.0 to 34.9 in adult, unspecified obesity type  Course: Erin Vazquez is currently in the action stage of change. As such, her goal is to continue with weight loss efforts.   Nutrition goals: She has agreed to the [employer].   Exercise goals: For substantial health benefits, adults should do at least 150 minutes (2 hours and 30 minutes) a week of moderate-intensity, or 75 minutes (1 hour and 15 minutes) a week of vigorous-intensity aerobic physical activity, or an equivalent combination of moderate- and vigorous-intensity aerobic activity. Aerobic activity should be performed in episodes of at least 10 minutes, and preferably, it should be spread throughout the week. Continue with personal trainer.  Behavioral modification strategies: increasing lean protein intake, decreasing simple carbohydrates, increasing vegetables, increasing water intake and decreasing liquid calories.  Erin Vazquez has agreed to follow-up with our clinic in 4 weeks. She was informed of the importance of frequent follow-up visits to maximize her success with intensive lifestyle modifications for her multiple health conditions.   Objective:   Blood pressure 127/73, pulse (!) 55, temperature 97.8 F (36.6 C), temperature source Oral, height 5\' 5"  (1.651 m), weight 208 lb (94.3 kg), SpO2 99 %. Body mass index is 34.61 kg/m.  General: Cooperative, alert, well developed, in no acute distress. HEENT: Conjunctivae and lids unremarkable. Cardiovascular: Regular rhythm.  Lungs: Normal work of breathing. Neurologic:  No focal deficits.   Lab Results  Component Value Date   CREATININE 0.84 03/31/2020   BUN 16 03/31/2020   NA 141 03/31/2020   K 3.8 03/31/2020   CL 102 03/31/2020   CO2 25 03/31/2020   Lab Results  Component Value Date   ALT 19 03/31/2020   AST 25 03/31/2020   ALKPHOS 81 03/31/2020    BILITOT 0.7 03/31/2020   No results found for: HGBA1C Lab Results  Component Value Date   INSULIN 11.8 12/26/2019   Lab Results  Component Value Date   TSH 2.470 03/31/2020   Lab Results  Component Value Date   CHOL 172 12/26/2007   HDL 43 12/26/2007   LDLCALC 109 (H) 12/26/2007   TRIG 102 12/26/2007   CHOLHDL 4.0 Ratio 12/26/2007   Lab Results  Component Value Date   WBC 7.7 03/31/2020   HGB 12.2 03/31/2020   HCT 40.5 03/31/2020   MCV 73 (L) 03/31/2020   PLT 260 03/31/2020   Lab Results  Component Value Date   IRON 90 03/31/2020   TIBC 299 03/31/2020   FERRITIN 187 (H) 03/31/2020   Obesity Behavioral Intervention:   Approximately 15 minutes were spent on the discussion below.  ASK: We discussed the diagnosis of obesity with Erin Vazquez today and Erin Vazquez agreed to give [name] permission to discuss obesity behavioral modification therapy today.  ASSESS: Erin Vazquez has the diagnosis of obesity and her BMI today is 34.7. Erin Vazquez is in the action stage of change.   ADVISE: Erin Vazquez was educated on the multiple health risks of obesity as well as the benefit of weight loss to improve her health. She was advised of the need for long term treatment and the importance of lifestyle modifications to improve her current health and to decrease her risk of future health problems.  AGREE: Multiple dietary modification options and treatment options were discussed and Erin Vazquez agreed to follow the recommendations documented in the above note.  ARRANGE: Erin Vazquez was educated on the importance of frequent visits to treat obesity as outlined per CMS and USPSTF guidelines and agreed to schedule her next follow up appointment today.  Attestation Statements:   Reviewed by clinician on day of visit: allergies, medications, problem list, medical history, surgical history, family history, social history, and previous encounter notes.  I, Water quality scientist, CMA, am acting as transcriptionist for Briscoe Deutscher, DO  I  have reviewed the above documentation for accuracy and completeness, and I agree with the above. Briscoe Deutscher, DO

## 2020-06-08 IMAGING — DX DG RIBS W/ CHEST 3+V*L*
4 series · 4 of 4 positions shown · non-contrast
Comparison: Chest radiograph dated 03/24/2019

CLINICAL DATA: Chest pain

EXAM:
LEFT RIBS AND CHEST - 3+ VIEW

[chest pa]
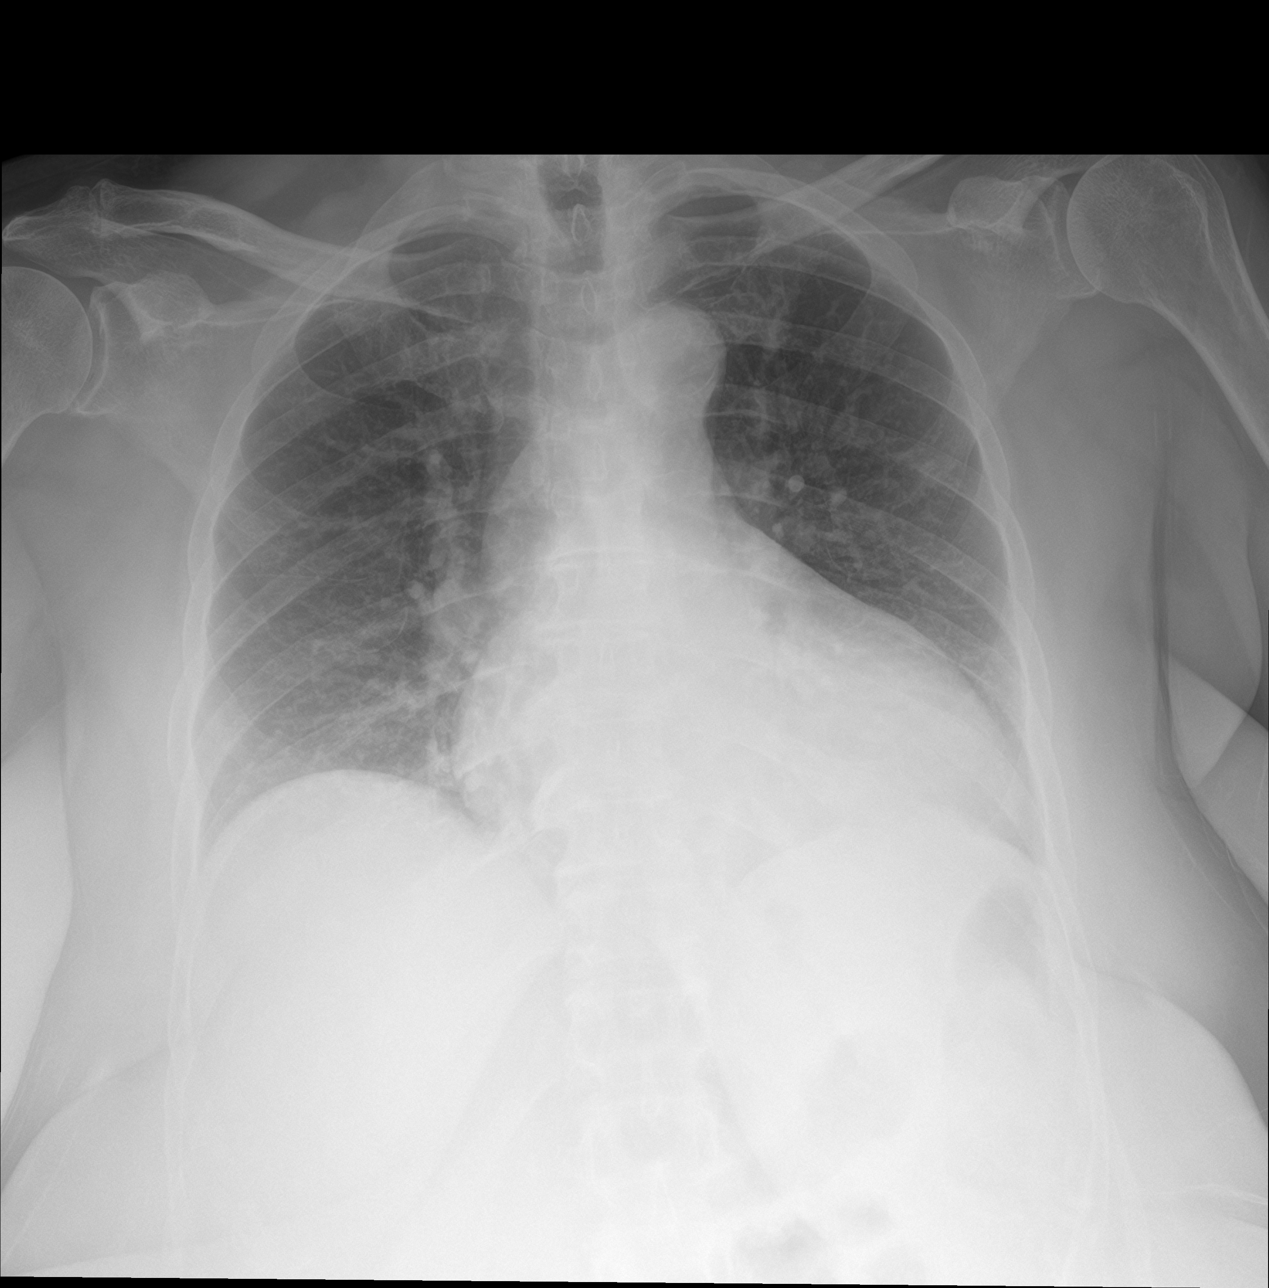

[chest ap (1 of 3)]
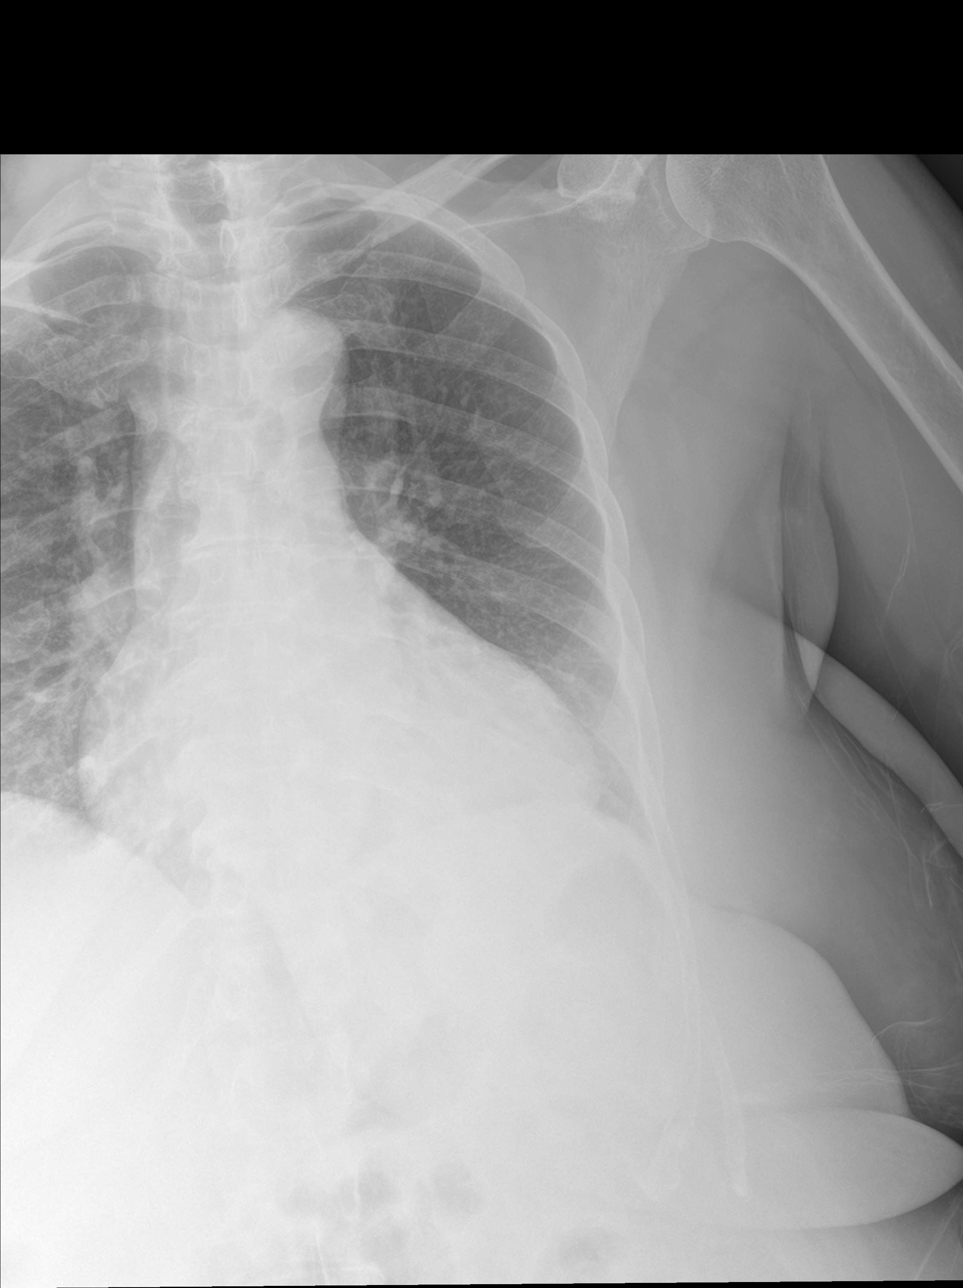

[chest ap (2 of 3)]
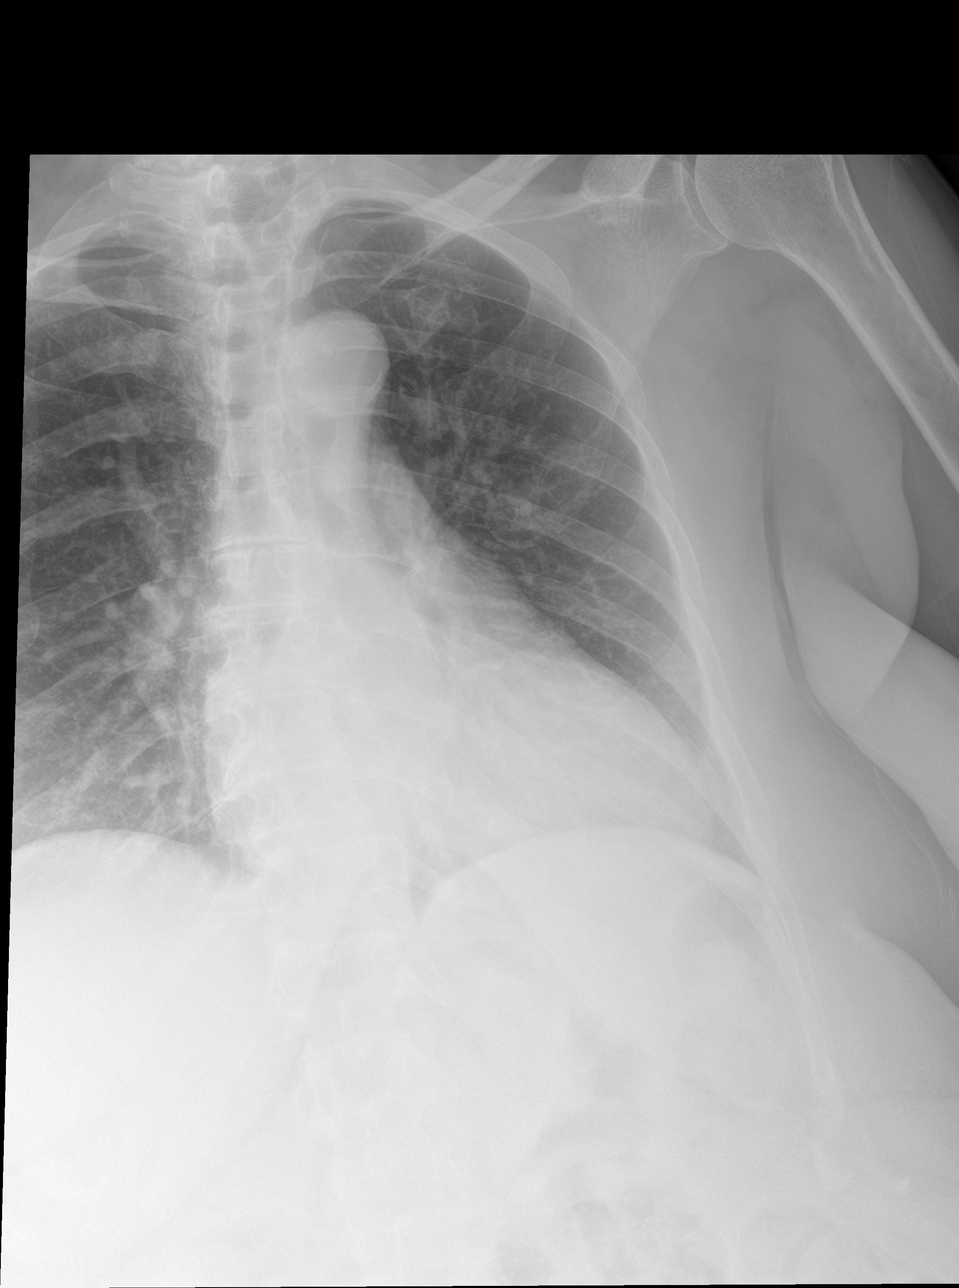

[chest ap (3 of 3)]
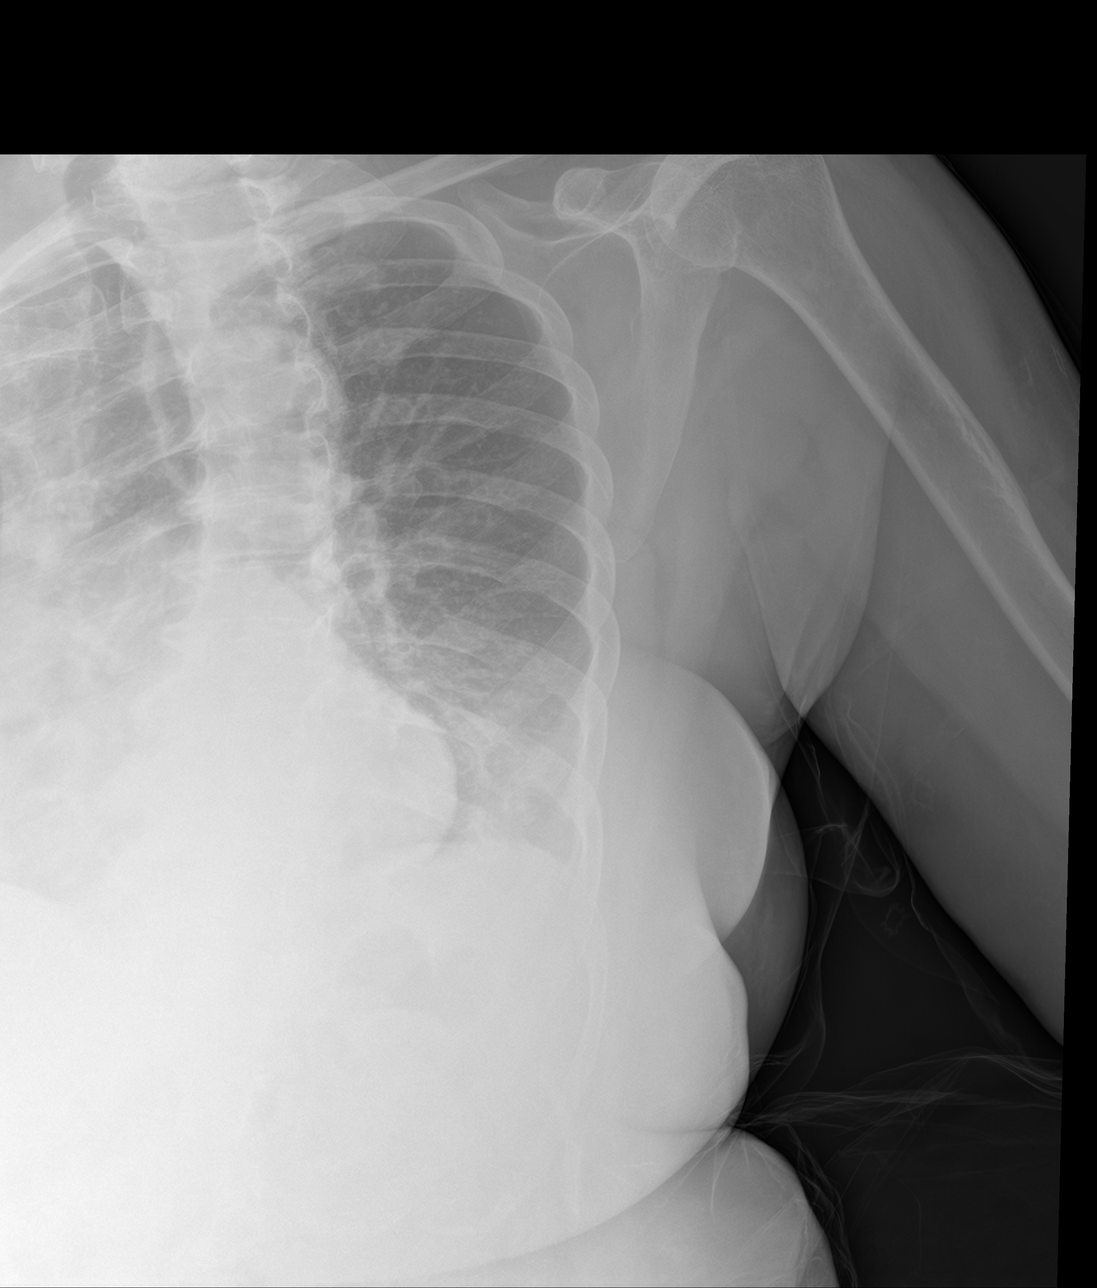

[4 of 4 positions shown; findings below may reference images not displayed]

FINDINGS: The heart is mildly enlarged. Vascular calcifications are seen in
the aortic arch. The lungs are clear. There is no pleural effusion
or pneumothorax. The previously documented right lateral seventh rib
fracture is less well seen on today's exam. No new acute osseous
injury is identified.
IMPRESSION: 1. Clear lungs.
2. Previously seen right lateral seventh rib fracture is less well
seen on today's exam. No new acute osseous injury is identified.

## 2020-06-08 IMAGING — DX DG KNEE COMPLETE 4+V*L*
4 series · 4 of 4 positions shown · non-contrast
Comparison: Knee radiographs dated 01/19/2009

CLINICAL DATA: Knee pain

EXAM:
LEFT KNEE - COMPLETE 4+ VIEW

[knee ap]
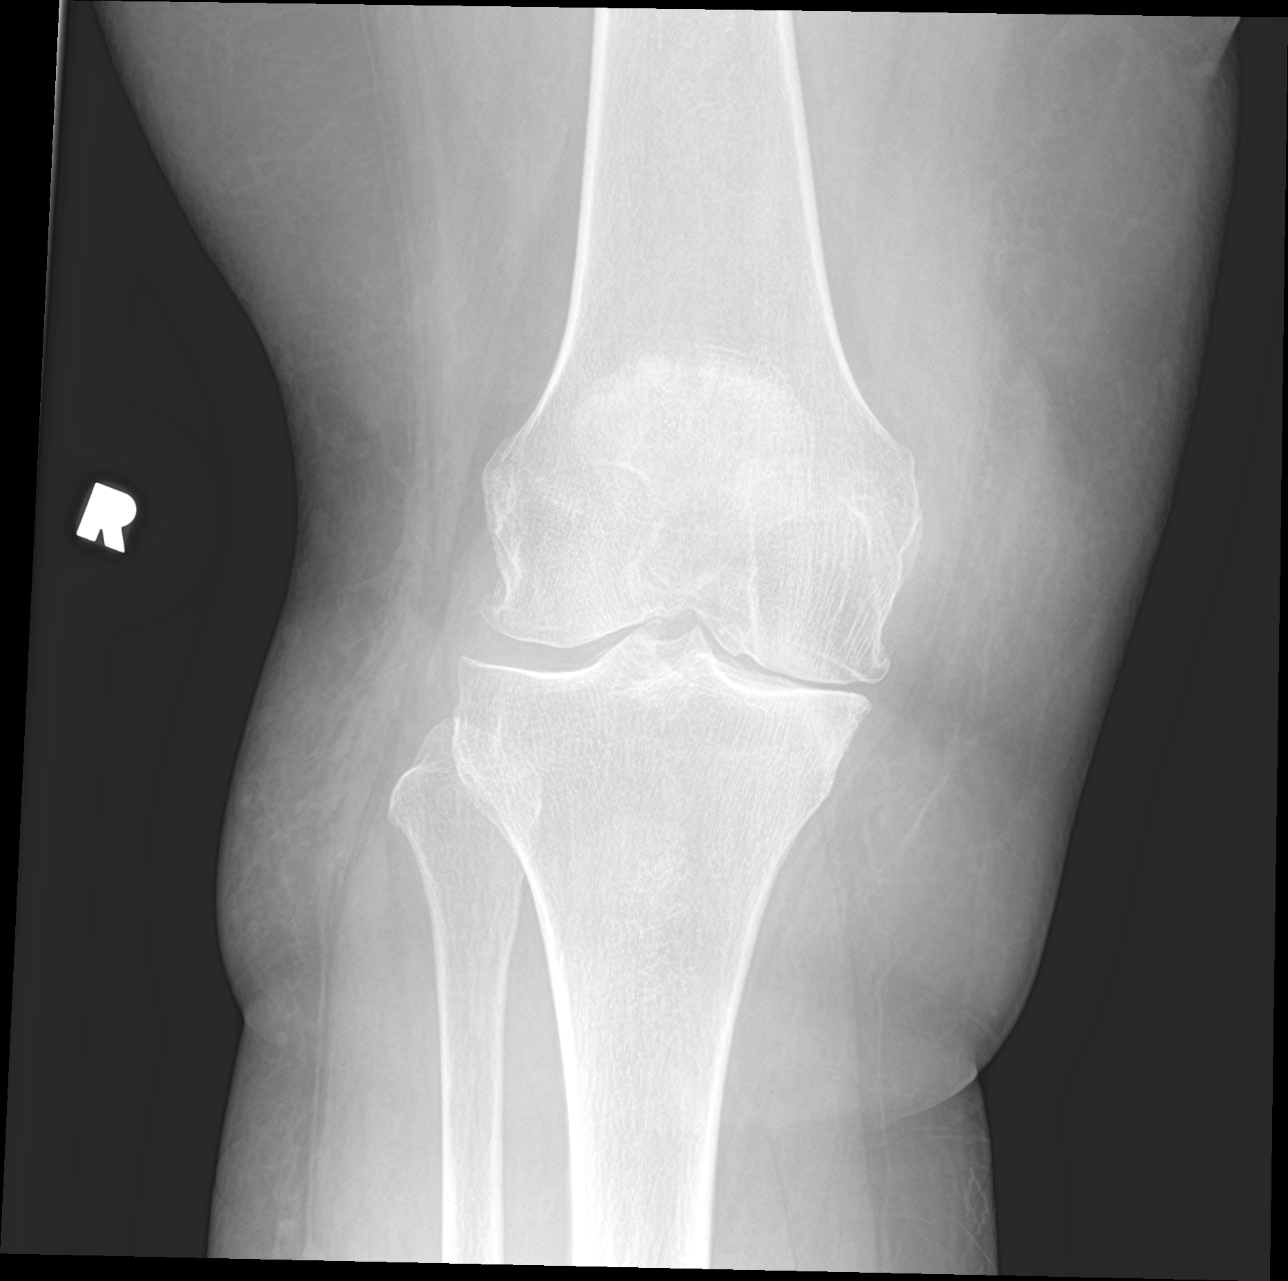

[knee obl (1 of 2)]
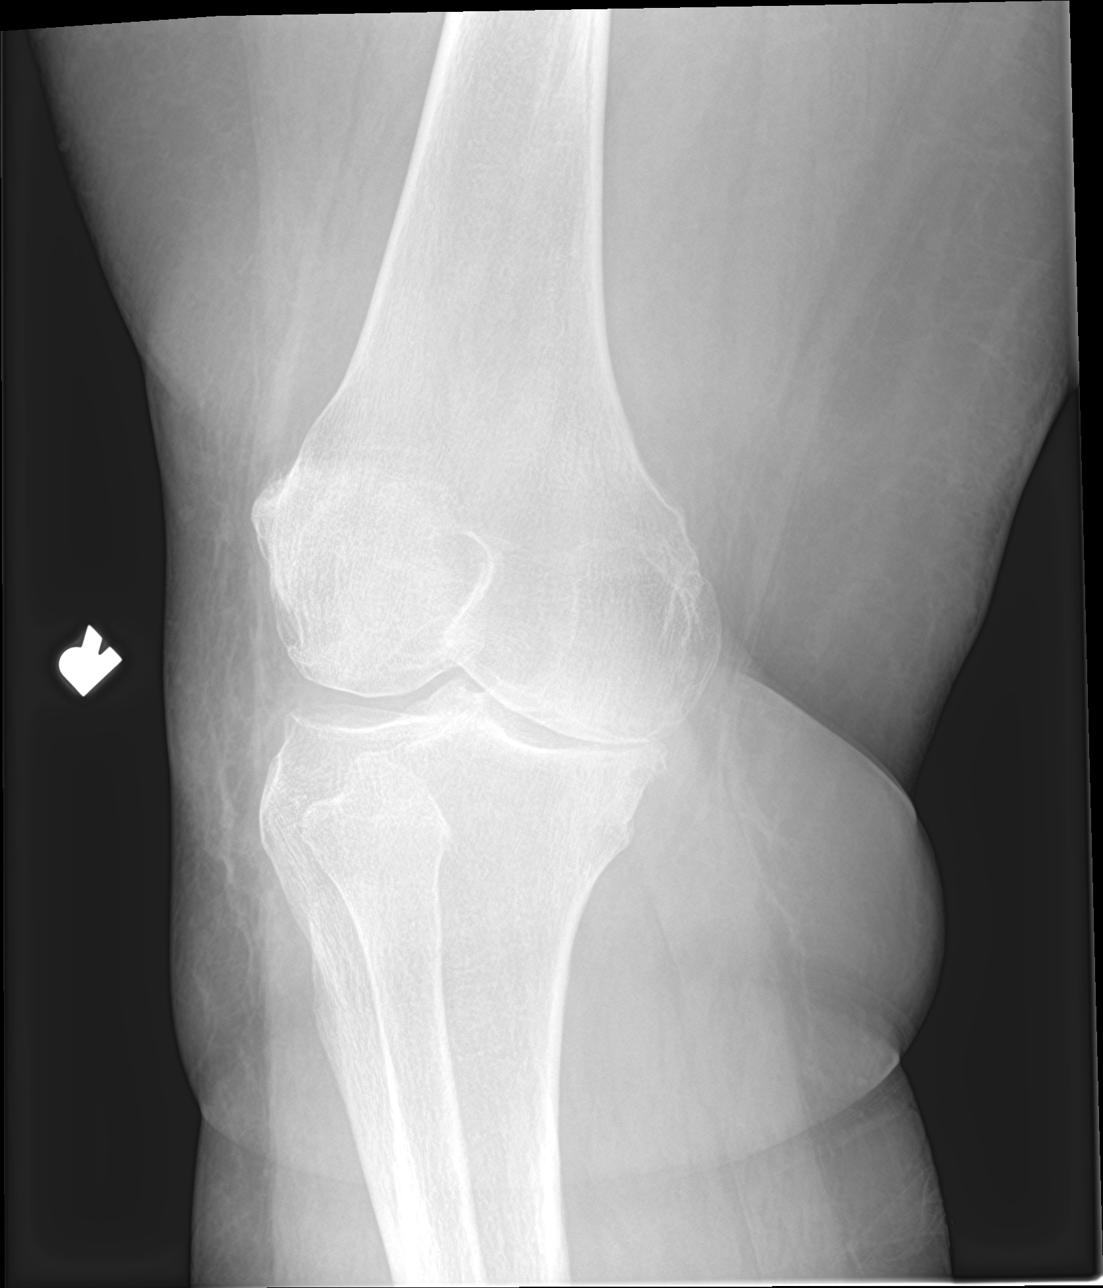

[knee obl (2 of 2)]
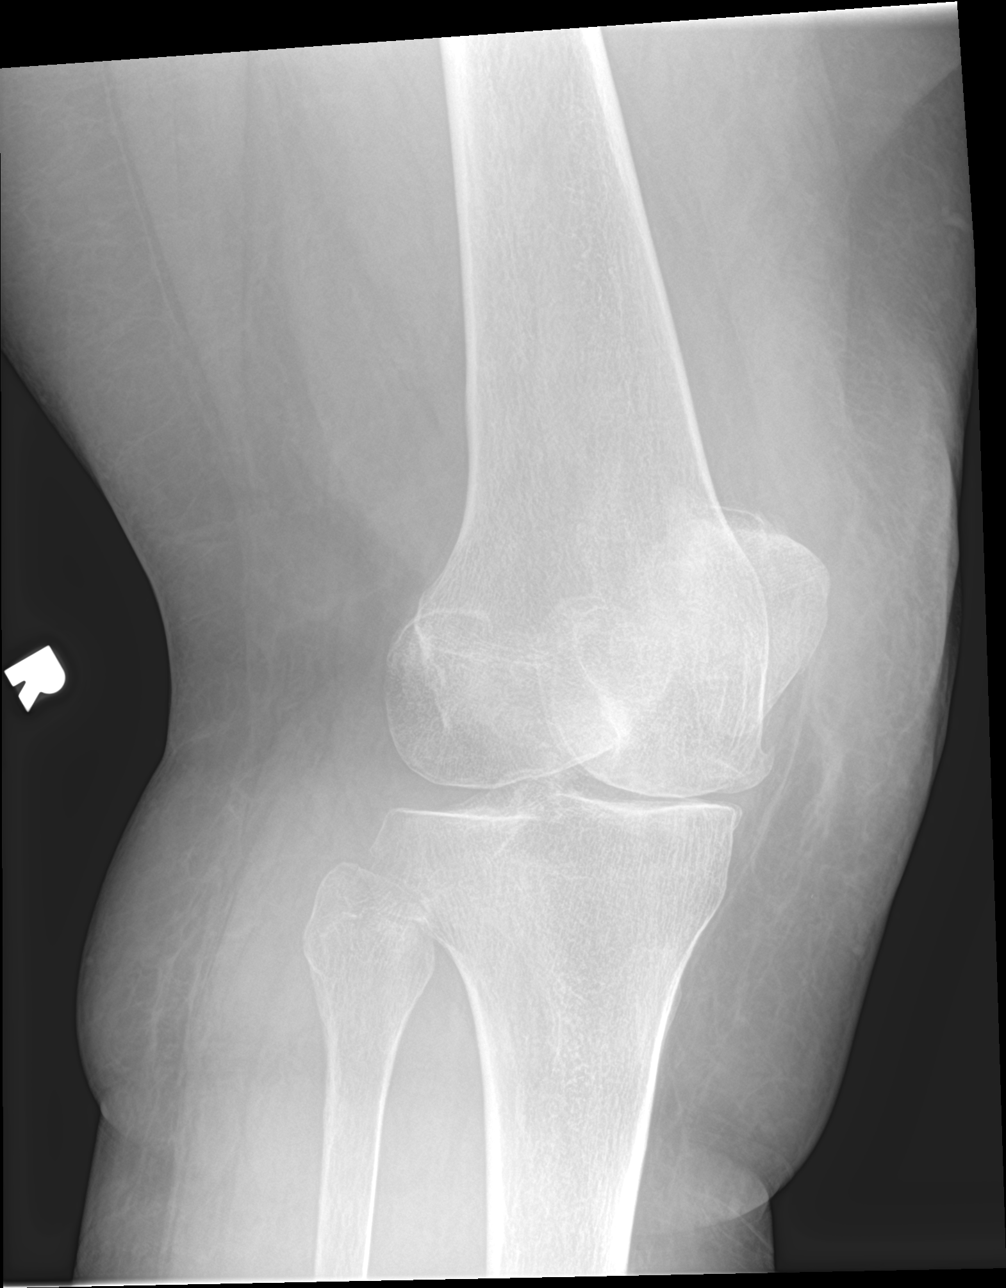

[knee lat]
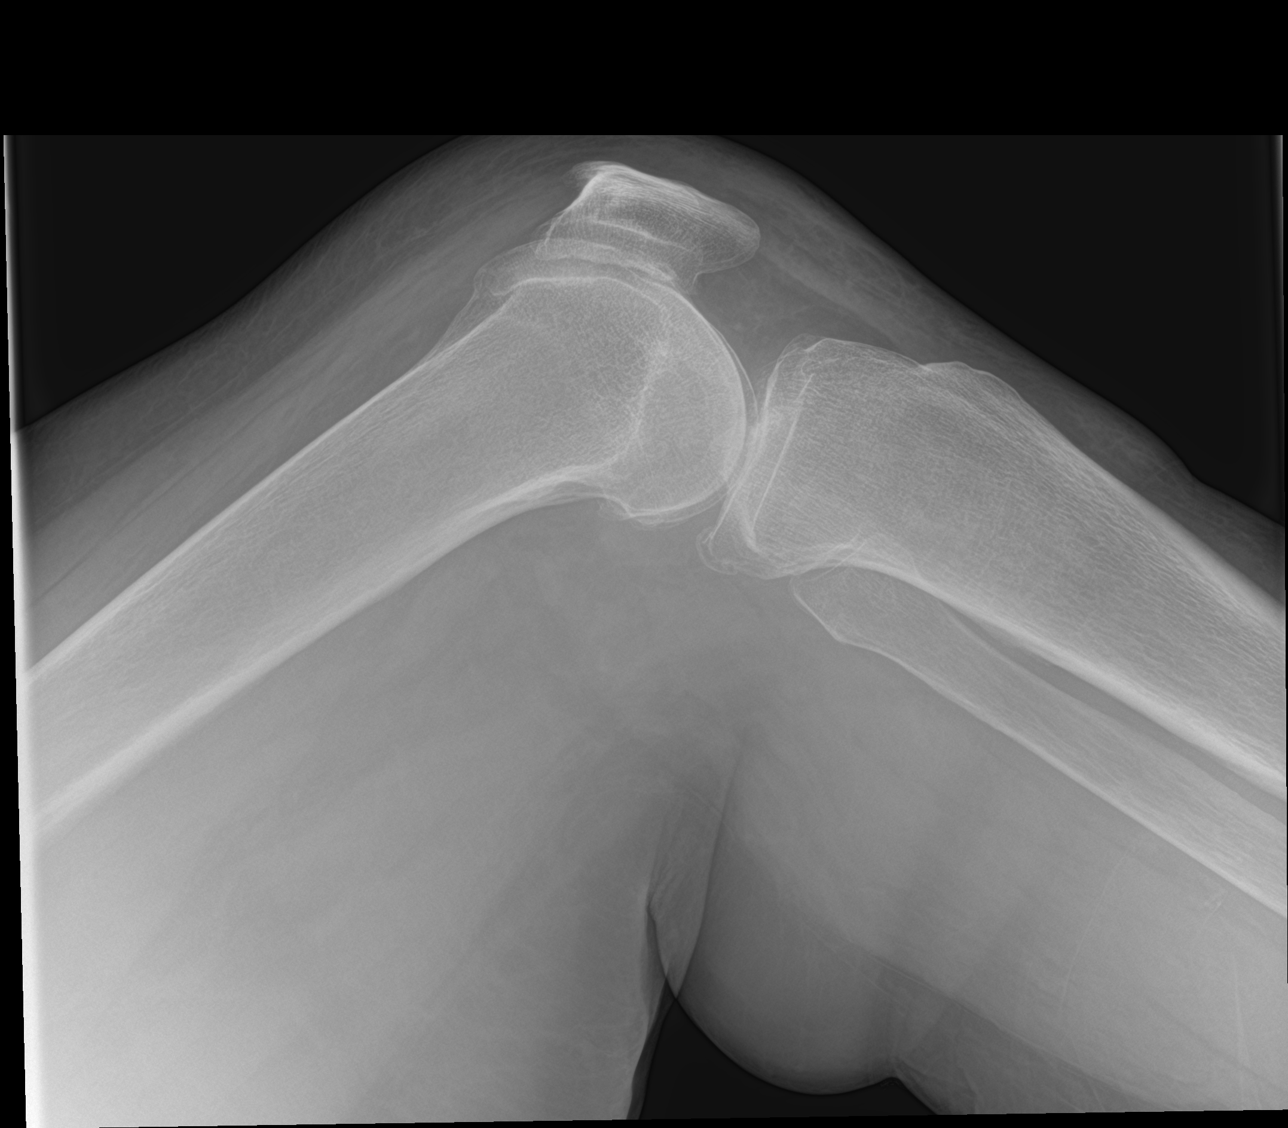

[4 of 4 positions shown; findings below may reference images not displayed]

FINDINGS: No evidence of fracture, dislocation, or joint effusion. There is a
mild to moderate tricompartmental osteoarthritis, most significant
in the medial compartment. There is a small superior patellar
enthesophyte. Soft tissues are unremarkable.
IMPRESSION: Mild to moderate tricompartmental osteoarthritis. No acute osseous
injury.

## 2020-06-16 ENCOUNTER — Other Ambulatory Visit: Payer: Self-pay

## 2020-06-16 ENCOUNTER — Encounter (INDEPENDENT_AMBULATORY_CARE_PROVIDER_SITE_OTHER): Payer: Self-pay | Admitting: Family Medicine

## 2020-06-16 ENCOUNTER — Telehealth (INDEPENDENT_AMBULATORY_CARE_PROVIDER_SITE_OTHER): Payer: Medicare HMO | Admitting: Family Medicine

## 2020-06-16 DIAGNOSIS — I10 Essential (primary) hypertension: Secondary | ICD-10-CM

## 2020-06-16 DIAGNOSIS — Z6834 Body mass index (BMI) 34.0-34.9, adult: Secondary | ICD-10-CM

## 2020-06-16 DIAGNOSIS — E038 Other specified hypothyroidism: Secondary | ICD-10-CM

## 2020-06-16 DIAGNOSIS — E559 Vitamin D deficiency, unspecified: Secondary | ICD-10-CM | POA: Diagnosis not present

## 2020-06-16 DIAGNOSIS — F418 Other specified anxiety disorders: Secondary | ICD-10-CM

## 2020-06-16 DIAGNOSIS — E669 Obesity, unspecified: Secondary | ICD-10-CM

## 2020-06-17 MED ORDER — VITAMIN D (ERGOCALCIFEROL) 1.25 MG (50000 UNIT) PO CAPS: 50000.0000 [IU] | ORAL_CAPSULE | ORAL | 0 refills | Status: DC

## 2020-06-18 NOTE — Progress Notes (Signed)
TeleHealth Visit:  Due to the COVID-19 pandemic, this visit was completed with telemedicine (audio/video) technology to reduce patient and provider exposure as well as to preserve personal protective equipment.   Erin Vazquez has verbally consented to this TeleHealth visit. The patient is located at home, the provider is located at the [facility] office. The participants in this visit include the listed provider and patient. The visit was conducted today via MyChart video.  Chief Complaint: OBESITY Erin Vazquez is here to discuss her progress with her obesity treatment plan along with follow-up of her obesity related diagnoses. Erin Vazquez is on the [employer] and states she is following her eating plan approximately 75% of the time. Erin Vazquez states she is working with a Psychologist, educational for 30 minutes 3 times per week.  Today's visit was #: 8 Starting weight: 224 lbs Starting date: 12/26/2019  Interim History: Erin Vazquez says that things have been harder over the past 2 weeks because school is out and she is having a difficult time with her adopted daughter with autism.  She has still been exercising with her trainer.  Blood pressure at home is 122/69, heart rate 66.  Assessment/Plan:   1. Vitamin D deficiency Improving, but not optimized. Current vitamin D is 45.0, tested on 03/31/2020. Optimal goal > 50 ng/dL.   Plan:  [x]   Continue Vitamin D @50 ,000 IU every week. []   Continue home supplement daily. [x]   Follow-up for routine testing of Vitamin D at least 2-3 times per year to avoid over-replacement.  - Refill Vitamin D, Ergocalciferol, (DRISDOL) 1.25 MG (50000 UNIT) CAPS capsule; Take 1 capsule (50,000 Units total) by mouth every 7 (seven) days.  Dispense: 4 capsule; Refill: 0  2. Essential hypertension At goal. Medications: losartan-HCTZ 50-12.5 two tablets daily.   Plan: Avoid buying foods that are: processed, frozen, or prepackaged to avoid excess salt. We will continue to monitor  symptoms as they relate to her weight loss journey.  Continue medications.  BP Readings from Last 3 Encounters:  05/18/20 127/73  04/21/20 130/73  03/31/20 118/68   Lab Results  Component Value Date   CREATININE 0.84 03/31/2020   3. Other specified hypothyroidism Erin Vazquez is taking levothyroxine 75 mcg daily.  Lab Results  Component Value Date   TSH 2.470 03/31/2020   Plan: Patient was instructed not to take MVM or iron within 4 hours of taking thyroid medications. We will continue to monitor symptoms as they relate to her weight loss journey.  4. Situational anxiety We reviewed self-care today. We will continue to monitor symptoms as they relate to her weight loss journey.  5. Class 1 obesity with serious comorbidity and body mass index (BMI) of 34.0 to 34.9 in adult, unspecified obesity type  Erin Vazquez is currently in the action stage of change. As such, her goal is to continue with weight loss efforts. She has agreed to the 04/02/2020.   Exercise goals: For substantial health benefits, adults should do at least 150 minutes (2 hours and 30 minutes) a week of moderate-intensity, or 75 minutes (1 hour and 15 minutes) a week of vigorous-intensity aerobic physical activity, or an equivalent combination of moderate- and vigorous-intensity aerobic activity. Aerobic activity should be performed in episodes of at least 10 minutes, and preferably, it should be spread throughout the week.  Behavioral modification strategies: increasing lean protein intake, decreasing simple carbohydrates, increasing vegetables and increasing water intake.  Erin Vazquez has agreed to follow-up with our clinic in 4 weeks. She was informed  of the importance of frequent follow-up visits to maximize her success with intensive lifestyle modifications for her multiple health conditions.  Objective:   VITALS: Per patient if applicable, see vitals. GENERAL: Alert and in no acute distress. CARDIOPULMONARY: No increased  WOB. Speaking in clear sentences.  PSYCH: Pleasant and cooperative. Speech normal rate and rhythm. Affect is appropriate. Insight and judgement are appropriate. Attention is focused, linear, and appropriate.  NEURO: Oriented as arrived to appointment on time with no prompting.   Lab Results  Component Value Date   CREATININE 0.84 03/31/2020   BUN 16 03/31/2020   NA 141 03/31/2020   K 3.8 03/31/2020   CL 102 03/31/2020   CO2 25 03/31/2020   Lab Results  Component Value Date   ALT 19 03/31/2020   AST 25 03/31/2020   ALKPHOS 81 03/31/2020   BILITOT 0.7 03/31/2020   Lab Results  Component Value Date   INSULIN 11.8 12/26/2019   Lab Results  Component Value Date   TSH 2.470 03/31/2020   Lab Results  Component Value Date   CHOL 172 12/26/2007   HDL 43 12/26/2007   LDLCALC 109 (H) 12/26/2007   TRIG 102 12/26/2007   CHOLHDL 4.0 Ratio 12/26/2007   Lab Results  Component Value Date   WBC 7.7 03/31/2020   HGB 12.2 03/31/2020   HCT 40.5 03/31/2020   MCV 73 (L) 03/31/2020   PLT 260 03/31/2020   Lab Results  Component Value Date   IRON 90 03/31/2020   TIBC 299 03/31/2020   FERRITIN 187 (H) 03/31/2020   Attestation Statements:   Reviewed by clinician on day of visit: allergies, medications, problem list, medical history, surgical history, family history, social history, and previous encounter notes.  I, Water quality scientist, CMA, am acting as transcriptionist for Briscoe Deutscher, DO  I have reviewed the above documentation for accuracy and completeness, and I agree with the above. Briscoe Deutscher, DO

## 2020-06-26 ENCOUNTER — Other Ambulatory Visit: Payer: Self-pay | Admitting: Internal Medicine

## 2020-06-29 LAB — URINE CULTURE
MICRO NUMBER:: 11421555
SPECIMEN QUALITY:: ADEQUATE

## 2020-06-29 LAB — URINALYSIS
Bilirubin Urine: NEGATIVE
Glucose, UA: NEGATIVE
Ketones, ur: NEGATIVE
Nitrite: POSITIVE — AB
Specific Gravity, Urine: 1.02 (ref 1.001–1.03)
pH: 5.5 (ref 5.0–8.0)

## 2020-07-15 ENCOUNTER — Other Ambulatory Visit: Payer: Self-pay

## 2020-07-15 ENCOUNTER — Ambulatory Visit (INDEPENDENT_AMBULATORY_CARE_PROVIDER_SITE_OTHER): Payer: Medicare HMO | Admitting: Family Medicine

## 2020-07-15 ENCOUNTER — Encounter (INDEPENDENT_AMBULATORY_CARE_PROVIDER_SITE_OTHER): Payer: Self-pay | Admitting: Family Medicine

## 2020-07-15 VITALS — BP 139/73 | HR 52 | Temp 97.4°F | Ht 65.0 in | Wt 208.0 lb

## 2020-07-15 DIAGNOSIS — E038 Other specified hypothyroidism: Secondary | ICD-10-CM

## 2020-07-15 DIAGNOSIS — E559 Vitamin D deficiency, unspecified: Secondary | ICD-10-CM

## 2020-07-15 DIAGNOSIS — E669 Obesity, unspecified: Secondary | ICD-10-CM | POA: Diagnosis not present

## 2020-07-15 DIAGNOSIS — I10 Essential (primary) hypertension: Secondary | ICD-10-CM

## 2020-07-15 DIAGNOSIS — Z6834 Body mass index (BMI) 34.0-34.9, adult: Secondary | ICD-10-CM

## 2020-07-16 MED ORDER — VITAMIN D (ERGOCALCIFEROL) 1.25 MG (50000 UNIT) PO CAPS: 50000.0000 [IU] | ORAL_CAPSULE | ORAL | 0 refills | Status: DC

## 2020-07-20 NOTE — Progress Notes (Signed)
Chief Complaint:   OBESITY Erin Vazquez is here to discuss her progress with her obesity treatment plan along with follow-up of her obesity related diagnoses.   Today's visit was #: 9 Starting weight: 224 lbs Starting date: 12/26/2019 Today's weight: 208 lbs Today's date: 07/15/2020 Total lbs lost to date: 16 lbs Body mass index is 34.61 kg/m.  Total weight loss percentage to date: -7.14%  Interim History: Erin Vazquez says that on 3/5, she will be starting a "new chapter". Nutrition Plan: [place] for 75% of the time. Activity: Cardio / Strength training for 30 minutes 3 times per week.  Assessment/Plan:   1. Vitamin D deficiency Improving, but not optimized. Current vitamin D is 45.0, tested on 03/31/2020. Optimal goal > 50 ng/dL. Plan: Continue to take prescription Vitamin D @50 ,000 IU every week as prescribed.  Follow-up for routine testing of Vitamin D, at least 2-3 times per year to avoid over-replacement.  - Refill Vitamin D, Ergocalciferol, (DRISDOL) 1.25 MG (50000 UNIT) CAPS capsule; Take 1 capsule (50,000 Units total) by mouth every 7 (seven) days.  Dispense: 4 capsule; Refill: 0  2. Other specified hypothyroidism Course: Stable. Medication: levothyroxine 75 mcg daily.   Plan: Patient was instructed not to take MVM or iron within 4 hours of taking thyroid medications. This issue is managed by her PCP. We will continue to monitor alongside Endocrinology/PCP as it relates to her weight loss journey.   Lab Results  Component Value Date   TSH 2.470 03/31/2020   3. Essential hypertension At goal. Medications: Hyzaar 50-12.5 mg daily.   Plan: Avoid buying foods that are: processed, frozen, or prepackaged to avoid excess salt. We will continue to monitor closely alongside her PCP and/or Specialist.  Regular follow up with PCP and specialists was also encouraged.   BP Readings from Last 3 Encounters:  07/15/20 139/73  05/18/20 127/73  04/21/20 130/73   Lab Results   Component Value Date   CREATININE 0.84 03/31/2020   4. Class 1 obesity with serious comorbidity and body mass index (BMI) of 34.0 to 34.9 in adult, unspecified obesity type  Course: [name] is currently in the action stage of change. As such, her goal is to continue with weight loss efforts.   Nutrition goals: She has agreed to the [employer].   Exercise goals: For substantial health benefits, adults should do at least 150 minutes (2 hours and 30 minutes) a week of moderate-intensity, or 75 minutes (1 hour and 15 minutes) a week of vigorous-intensity aerobic physical activity, or an equivalent combination of moderate- and vigorous-intensity aerobic activity. Aerobic activity should be performed in episodes of at least 10 minutes, and preferably, it should be spread throughout the week.  Behavioral modification strategies: increasing lean protein intake, decreasing simple carbohydrates, increasing vegetables and increasing water intake.  Erin Vazquez has agreed to follow-up with our clinic in 3 weeks. She was informed of the importance of frequent follow-up visits to maximize her success with intensive lifestyle modifications for her multiple health conditions.   Objective:   Blood pressure 139/73, pulse (!) 52, temperature (!) 97.4 F (36.3 C), temperature source Oral, height 5\' 5"  (1.651 m), weight 208 lb (94.3 kg), SpO2 100 %. Body mass index is 34.61 kg/m.  General: Cooperative, alert, well developed, in no acute distress. HEENT: Conjunctivae and lids unremarkable. Cardiovascular: Regular rhythm.  Lungs: Normal work of breathing. Neurologic: No focal deficits.   Lab Results  Component Value Date   CREATININE 0.84 03/31/2020   BUN  16 03/31/2020   NA 141 03/31/2020   K 3.8 03/31/2020   CL 102 03/31/2020   CO2 25 03/31/2020   Lab Results  Component Value Date   ALT 19 03/31/2020   AST 25 03/31/2020   ALKPHOS 81 03/31/2020   BILITOT 0.7 03/31/2020   Lab Results  Component  Value Date   INSULIN 11.8 12/26/2019   Lab Results  Component Value Date   TSH 2.470 03/31/2020   Lab Results  Component Value Date   CHOL 172 12/26/2007   HDL 43 12/26/2007   LDLCALC 109 (H) 12/26/2007   TRIG 102 12/26/2007   CHOLHDL 4.0 Ratio 12/26/2007   Lab Results  Component Value Date   WBC 7.7 03/31/2020   HGB 12.2 03/31/2020   HCT 40.5 03/31/2020   MCV 73 (L) 03/31/2020   PLT 260 03/31/2020   Lab Results  Component Value Date   IRON 90 03/31/2020   TIBC 299 03/31/2020   FERRITIN 187 (H) 03/31/2020   Obesity Behavioral Intervention:   Approximately 15 minutes were spent on the discussion below.  ASK: We discussed the diagnosis of obesity with Erin Vazquez today and Erin Vazquez agreed to give [name] permission to discuss obesity behavioral modification therapy today.  ASSESS: D[place] has the diagnosis of obesity and her BMI today is 34.7. Erin Vazquez is in the action stage of change.   ADVISE: Erin Vazquez was educated on the multiple health risks of obesity as well as the benefit of weight loss to improve her health. She was advised of the need for long term treatment and the importance of lifestyle modifications to improve her current health and to decrease her risk of future health problems.  AGREE: Multiple dietary modification options and treatment options were discussed and Erin Vazquez agreed to follow the recommendations documented in the above note.  ARRANGE: Erin Vazquez was educated on the importance of frequent visits to treat obesity as outlined per CMS and USPSTF guidelines and agreed to schedule her next follow up appointment today.  Attestation Statements:   Reviewed by clinician on day of visit: allergies, medications, problem list, medical history, surgical history, family history, social history, and previous encounter notes.  I, Water quality scientist, CMA, am acting as transcriptionist for Erin Deutscher, DO  I have reviewed the above documentation for accuracy and completeness, and I agree  with the above. Erin Deutscher, DO

## 2020-07-27 ENCOUNTER — Ambulatory Visit: Payer: Medicare HMO | Admitting: Cardiology
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
## 2020-08-06 ENCOUNTER — Other Ambulatory Visit: Payer: Self-pay | Admitting: Internal Medicine

## 2020-08-10 ENCOUNTER — Ambulatory Visit (INDEPENDENT_AMBULATORY_CARE_PROVIDER_SITE_OTHER): Payer: Medicare HMO | Admitting: Family Medicine

## 2020-08-10 ENCOUNTER — Other Ambulatory Visit: Payer: Self-pay

## 2020-08-10 ENCOUNTER — Encounter (INDEPENDENT_AMBULATORY_CARE_PROVIDER_SITE_OTHER): Payer: Self-pay | Admitting: Family Medicine

## 2020-08-10 VITALS — BP 128/70 | HR 58 | Temp 97.8°F | Ht 65.0 in | Wt 207.0 lb

## 2020-08-10 DIAGNOSIS — E559 Vitamin D deficiency, unspecified: Secondary | ICD-10-CM

## 2020-08-10 DIAGNOSIS — E669 Obesity, unspecified: Secondary | ICD-10-CM

## 2020-08-10 DIAGNOSIS — E038 Other specified hypothyroidism: Secondary | ICD-10-CM | POA: Diagnosis not present

## 2020-08-10 DIAGNOSIS — Z6834 Body mass index (BMI) 34.0-34.9, adult: Secondary | ICD-10-CM

## 2020-08-10 DIAGNOSIS — I10 Essential (primary) hypertension: Secondary | ICD-10-CM | POA: Diagnosis not present

## 2020-08-11 MED ORDER — VITAMIN D (ERGOCALCIFEROL) 1.25 MG (50000 UNIT) PO CAPS: 50000.0000 [IU] | ORAL_CAPSULE | ORAL | 0 refills | Status: DC

## 2020-08-11 NOTE — Progress Notes (Signed)
Chief Complaint:   OBESITY Erin Vazquez is here to discuss her progress with her obesity treatment plan along with follow-up of her obesity related diagnoses.   Today's visit was #: 10 Starting weight: 224 lbs Starting date: 12/26/2019 Today's weight: 207 lbs Today's date: 08/10/2020 Total lbs lost to date: 17 lbs Body mass index is 34.45 kg/m.  Total weight loss percentage to date: -7.59%  Interim History:  Erin Vazquez says she is happy with her progress.  She is up 2 pounds of muscle. Current Meal Plan: the [place] for 75% of the time.  Current Exercise Plan: Cardio/strength training for 30-45 minutes 3 times per week.  Assessment/Plan:   1. Vitamin D deficiency Improving, but not optimized. Current vitamin D is 45.0, tested on 03/31/2020. Optimal goal > 50 ng/dL.   Plan: Continue to take prescription Vitamin D @50 ,000 IU every week as prescribed.  Follow-up for routine testing of Vitamin D, at least 2-3 times per year to avoid over-replacement.  - Refill Vitamin D, Ergocalciferol, (DRISDOL) 1.25 MG (50000 UNIT) CAPS capsule; Take 1 capsule (50,000 Units total) by mouth every 7 (seven) days.  Dispense: 4 capsule; Refill: 0  2. Essential hypertension Controlled.  Medications: losartan-HCTZ 50-12.5 mg daily.   Plan: Avoid buying foods that are: processed, frozen, or prepackaged to avoid excess salt. We will continue to monitor closely alongside her PCP and/or Specialist.  Regular follow up with PCP and specialists was also encouraged.   BP Readings from Last 3 Encounters:  08/10/20 128/70  07/15/20 139/73  05/18/20 127/73   Lab Results  Component Value Date   CREATININE 0.84 03/31/2020   3. Other specified hypothyroidism Course: Controlled. Medication: Synthroid 75 mcg daily.   Plan: Patient was instructed not to take MVM or iron within 4 hours of taking thyroid medications. We will continue to monitor alongside Endocrinology/PCP as it relates to her weight loss  journey.   Lab Results  Component Value Date   TSH 2.470 03/31/2020   4. Class 1 obesity with serious comorbidity and body mass index (BMI) of 34.0 to 34.9 in adult, unspecified obesity type  Course: Erin Vazquez is currently in the action stage of change. As such, her goal is to continue with weight loss efforts.   Nutrition goals: She has agreed to the [employer].   Exercise goals: As is.  Behavioral modification strategies: increasing lean protein intake, decreasing simple carbohydrates, increasing vegetables and increasing water intake.  Erin Vazquez has agreed to follow-up with our clinic in 4-5 weeks. She was informed of the importance of frequent follow-up visits to maximize her success with intensive lifestyle modifications for her multiple health conditions.   Objective:   Blood pressure 128/70, pulse (!) 58, temperature 97.8 F (36.6 C), temperature source Oral, height 5\' 5"  (1.651 m), weight 207 lb (93.9 kg), SpO2 99 %. Body mass index is 34.45 kg/m.  General: Cooperative, alert, well developed, in no acute distress. HEENT: Conjunctivae and lids unremarkable. Cardiovascular: Regular rhythm.  Lungs: Normal work of breathing. Neurologic: No focal deficits.   Lab Results  Component Value Date   CREATININE 0.84 03/31/2020   BUN 16 03/31/2020   NA 141 03/31/2020   K 3.8 03/31/2020   CL 102 03/31/2020   CO2 25 03/31/2020   Lab Results  Component Value Date   ALT 19 03/31/2020   AST 25 03/31/2020   ALKPHOS 81 03/31/2020   BILITOT 0.7 03/31/2020   Lab Results  Component Value Date   INSULIN 11.8  12/26/2019   Lab Results  Component Value Date   TSH 2.470 03/31/2020   Lab Results  Component Value Date   CHOL 172 12/26/2007   HDL 43 12/26/2007   LDLCALC 109 (H) 12/26/2007   TRIG 102 12/26/2007   CHOLHDL 4.0 Ratio 12/26/2007   Lab Results  Component Value Date   WBC 7.7 03/31/2020   HGB 12.2 03/31/2020   HCT 40.5 03/31/2020   MCV 73 (L) 03/31/2020   PLT 260  03/31/2020   Lab Results  Component Value Date   IRON 90 03/31/2020   TIBC 299 03/31/2020   FERRITIN 187 (H) 03/31/2020   Obesity Behavioral Intervention:   Approximately 15 minutes were spent on the discussion below.  ASK: We discussed the diagnosis of obesity with Erin Vazquez today and Erin Vazquez agreed to give [name] permission to discuss obesity behavioral modification therapy today.  ASSESS: Erin Vazquez has the diagnosis of obesity and her BMI today is 34.4. [place] is in the action stage of change.   ADVISE: Erin Vazquez was educated on the multiple health risks of obesity as well as the benefit of weight loss to improve her health. She was advised of the need for long term treatment and the importance of lifestyle modifications to improve her current health and to decrease her risk of future health problems.  AGREE: Multiple dietary modification options and treatment options were discussed and Erin Vazquez agreed to follow the recommendations documented in the above note.  ARRANGE: Erin Vazquez was educated on the importance of frequent visits to treat obesity as outlined per CMS and USPSTF guidelines and agreed to schedule her next follow up appointment today.  Attestation Statements:   Reviewed by clinician on day of visit: allergies, medications, problem list, medical history, surgical history, family history, social history, and previous encounter notes.  I, Water quality scientist, CMA, am acting as transcriptionist for Briscoe Deutscher, DO  I have reviewed the above documentation for accuracy and completeness, and I agree with the above. Briscoe Deutscher, DO

## 2020-08-25 ENCOUNTER — Other Ambulatory Visit: Payer: Self-pay

## 2020-08-25 ENCOUNTER — Encounter: Payer: Self-pay | Admitting: Adult Health

## 2020-08-25 ENCOUNTER — Ambulatory Visit: Payer: Medicare HMO | Admitting: Adult Health

## 2020-08-25 ENCOUNTER — Other Ambulatory Visit (HOSPITAL_COMMUNITY)
Admission: RE | Admit: 2020-08-25 | Discharge: 2020-08-25 | Disposition: A | Discharge status: Home / Self Care | Payer: Medicare HMO | Source: Ambulatory Visit | Attending: Adult Health | Admitting: Adult Health

## 2020-08-25 VITALS — BP 133/70 | HR 58 | Ht 64.0 in | Wt 214.5 lb

## 2020-08-25 DIAGNOSIS — B373 Candidiasis of vulva and vagina: Secondary | ICD-10-CM | POA: Insufficient documentation

## 2020-08-25 DIAGNOSIS — Z113 Encounter for screening for infections with a predominantly sexual mode of transmission: Secondary | ICD-10-CM | POA: Diagnosis present

## 2020-08-25 DIAGNOSIS — N83201 Unspecified ovarian cyst, right side: Secondary | ICD-10-CM | POA: Diagnosis not present

## 2020-08-25 DIAGNOSIS — N898 Other specified noninflammatory disorders of vagina: Secondary | ICD-10-CM

## 2020-08-25 NOTE — Progress Notes (Signed)
  Subjective:     Patient ID: Erin Vazquez, female   DOB: 10/09/1940, 80 y.o.   MRN: [id number]  HPI Erin Vazquez is a 80 year old black female, widowed,PM in with complaints of vaginal irritation, had sex her birthday weekend, for first time in 32 years. No condom was used, he was 80. She has history of right ovary cyst that she did not get follow up on. She did not get CA 125 either.  PCP is Dr Jeanie Cooks.  Review of Systems +vaginal irritation +vaginal discharge Reviewed past medical,surgical, social and family history. Reviewed medications and allergies.     Objective:   Physical Exam BP 133/70 (BP Location: Left Arm, Patient Position: Sitting, Cuff Size: Large)   Pulse (!) 58   Ht 5\' 4"  (1.626 m)   Wt 214 lb 8 oz (97.3 kg)   BMI 36.82 kg/m  Skin warm and dry.Pelvic: external genitalia is normal in appearance no lesions, vagina: white discharge without odor,urethra has no lesions or masses noted, cervix:smooth, uterus: normal size, shape and contour, non tender, no masses felt, adnexa: no masses or tenderness noted. Bladder is non tender and no masses felt. CV swab obtained.   Fall risk is low  Upstream - 08/25/20 0903      Pregnancy Intention Screening   Does the patient want to become pregnant in the next year? N/A    Does the patient's partner want to become pregnant in the next year? N/A    Would the patient like to discuss contraceptive options today? N/A      Contraception Wrap Up   Current Method No Method - Other Reason   postmenopausal   End Method No Method - Other Reason   postmenopausal   Contraception Counseling Provided No         Examination chaperoned by Tinnie Gens NP student. Assessment:     1. Vaginal irritation CV swab sent  2. Screening examination for STD (sexually transmitted disease) CV swab sent for GC/CHL,trich,BV and yeast  3. Cyst of ovary, right Return in about 3 weeks for GYN [name]    Plan:    Will talk when results back

## 2020-08-26 ENCOUNTER — Telehealth: Payer: Self-pay | Admitting: Adult Health

## 2020-08-26 LAB — CERVICOVAGINAL ANCILLARY ONLY
Bacterial Vaginitis (gardnerella): NEGATIVE
Candida Glabrata: POSITIVE — AB
Candida Vaginitis: POSITIVE — AB
Chlamydia: NEGATIVE
Comment: NEGATIVE
Comment: NEGATIVE
Comment: NEGATIVE
Comment: NEGATIVE
Comment: NEGATIVE
Comment: NORMAL
Neisseria Gonorrhea: NEGATIVE
Trichomonas: NEGATIVE

## 2020-08-26 MED ORDER — FLUCONAZOLE 150 MG PO TABS: ORAL_TABLET | ORAL | 1 refills | Status: DC

## 2020-08-26 NOTE — Telephone Encounter (Signed)
Pt aware that vaginal swab +yeast, will rx diflucan

## 2020-09-14 ENCOUNTER — Ambulatory Visit (INDEPENDENT_AMBULATORY_CARE_PROVIDER_SITE_OTHER): Payer: Medicare HMO | Admitting: Family Medicine

## 2020-09-16 ENCOUNTER — Other Ambulatory Visit: Payer: Self-pay

## 2020-09-16 ENCOUNTER — Ambulatory Visit (INDEPENDENT_AMBULATORY_CARE_PROVIDER_SITE_OTHER): Payer: Medicare HMO

## 2020-09-16 ENCOUNTER — Other Ambulatory Visit: Payer: Self-pay | Admitting: Adult Health

## 2020-09-16 DIAGNOSIS — N83201 Unspecified ovarian cyst, right side: Secondary | ICD-10-CM | POA: Diagnosis not present

## 2020-09-16 NOTE — Progress Notes (Signed)
PELVIC US TA/TV: heterogeneous anteverted uterus with mult fibroids (#1) posterior mid submucosal fibroid 3.7 x 2.6 x 2.9 cm,(#2) anterior fundal left subserosal fibroid 1.9 x 1.7 x 1.8 cm,EEC 1.6 mm,small amount of simple fluid within the endometrium,normal left ovary,two right simple ovarian cysts (#1) 3.5 x 3.2 x 2.8 cm,(#2) 2.2 x 1.8 x 1.2 cm,no free fluid,no pain during ultrasound,ovaries appear mobile  [employer]

## 2020-10-01 NOTE — Progress Notes (Signed)
HPI: FU hypertension. Echocardiogram August 2020 showed normal LV systolic function, grade 1 diastolic dysfunction and mild left atrial enlargement. Aortic atherosclerosis noted on previous abdominal CT. Since last seen she denies dyspnea, chest pain, palpitations or syncope.  Chronic minimal pedal edema.  Current Outpatient Medications  Medication Sig Dispense Refill  . ferrous sulfate 325 (65 FE) MG tablet Take 325 mg by mouth daily with breakfast.    . GARLIC PO Take by mouth.    . levothyroxine (SYNTHROID, LEVOTHROID) 75 MCG tablet Take 75 mcg by mouth daily.    Marland Kitchen losartan-hydrochlorothiazide (HYZAAR) 50-12.5 MG per tablet Take 2 tablets by mouth daily.     Marland Kitchen MAGNESIUM PO Take by mouth daily.    . Multiple Vitamin (MULTIVITAMIN) tablet Take 1 tablet by mouth daily.    . Omega-3 Fatty Acids (FISH OIL PO) Take by mouth.    Marland Kitchen OVER THE COUNTER MEDICATION Med Name: Zyflamend    . UNABLE TO FIND Med Name: Vital Reds    . UNABLE TO FIND Med Name: Total Restore    . UNABLE TO FIND Bio complete 3-daily    . Vitamin D, Ergocalciferol, (DRISDOL) 1.25 MG (50000 UNIT) CAPS capsule Take 1 capsule (50,000 Units total) by mouth every 7 (seven) days. 4 capsule 0   No current facility-administered medications for this visit.     Past Medical History:  Diagnosis Date  . Adrenal nodule ([place])   . Arthritis   . Back pain   . Bunion, left foot   . Eczema   . Fibrocystic breast    Left  . Fibroids, endometrial and uterine   . Gallstones   . Genital herpes   . Hammer toe of left foot   . Heart valve problem   . History of cardiomegaly   . History of colonic diverticulitis   . History of iron deficiency anemia   . HTN (hypertension)   . Hypothyroid   . Joint pain   . Lower extremity edema   . Mild calcific aortic stenosis   . Osteoarthritis   . Ovarian cyst    pt unaware  . Pre-diabetes   . Sigmoid diverticulosis   . Venous insufficiency     Past Surgical History:  Procedure  Laterality Date  . BREAST RECONSTRUCTION  1976  . BREAST SURGERY Left 1961   Removed 3 fibroadenoma   . CERVICAL CONE BIOPSY  1981  . KNEE ARTHROSCOPY Left 2010 Dr. Aline Brochure  . MASTECTOMY, PARTIAL Left 1979   Subtotal  . OPEN REDUCTION INTERNAL FIXATION (ORIF) DISTAL RADIAL FRACTURE Left 04/24/2018   Procedure: OPEN REDUCTION INTERNAL FIXATION (ORIF) LEFT DISTAL RADIAL FRACTURE;  Surgeon: Renette Butters, MD;  Location: [facility];  Service: Orthopedics;  Laterality: Left;  . SHOULDER SURGERY Right 1976, 11/2017   lipoma excision  . TUBAL LIGATION    . venaseal      Social History   Socioeconomic History  . Marital status: Widowed    Spouse name: Not on file  . Number of children: 7  . Years of education: Not on file  . Highest education level: Not on file  Occupational History  . Occupation: retired    Fish farm manager: Engineer, manufacturing  . Occupation: caregiverand therapautic foster parent  Tobacco Use  . Smoking status: Former Smoker    Packs/day: 1.00    Years: 4.00    Pack years: 4.00    Types: Cigarettes    Quit date: 06/13/1965  Years since quitting: 55.3  . Smokeless tobacco: Never Used  Vaping Use  . Vaping Use: Never used  Substance and Sexual Activity  . Alcohol use: No  . Drug use: No  . Sexual activity: Yes    Birth control/protection: Post-menopausal  Other Topics Concern  . Not on file  Social History Narrative  . Not on file   Social Determinants of Health   Financial Resource Strain: Not on file  Food Insecurity: Not on file  Transportation Needs: Not on file  Physical Activity: Not on file  Stress: Not on file  Social Connections: Not on file  Intimate Partner Violence: Not on file    Family History  Problem Relation Age of Onset  . Diabetes Mother   . Hypertension Mother   . Dementia Mother   . Stroke Mother   . Thyroid disease Mother   . Cancer Father        pancreatic  . Multiple sclerosis Sister   . Drug abuse Brother    . Crohn's disease Brother   . Diabetes Maternal Grandmother   . Vision loss Maternal Grandmother   . Cancer Maternal Grandfather        prostate  . Cancer Paternal Grandmother        pancreatic    ROS: Knee pain but no fevers or chills, productive cough, hemoptysis, dysphasia, odynophagia, melena, hematochezia, dysuria, hematuria, rash, seizure activity, orthopnea, PND, claudication. Remaining systems are negative.  Physical Exam: Well-developed well-nourished in no acute distress.  Skin is warm and dry.  HEENT is normal.  Neck is supple.  Chest is clear to auscultation with normal expansion.  Cardiovascular exam is regular rate and rhythm.  Abdominal exam nontender or distended. No masses palpated. Extremities show trace edema. neuro grossly intact  A/P  1 hypertension-blood pressure elevated; however she states it is typically controlled.  We will continue to follow.. Check potassium and renal function.  2 hyperlipidemia-check lipids.  If LDL elevated she will consider lipid-lowering medication.  Kirk Ruths, MD

## 2020-10-09 ENCOUNTER — Ambulatory Visit: Payer: Medicare HMO | Admitting: Cardiology

## 2020-10-09 ENCOUNTER — Encounter: Payer: Self-pay | Admitting: Cardiology

## 2020-10-09 ENCOUNTER — Other Ambulatory Visit: Payer: Self-pay

## 2020-10-09 VITALS — BP 152/84 | HR 56 | Ht 64.0 in | Wt 216.6 lb

## 2020-10-09 DIAGNOSIS — E7800 Pure hypercholesterolemia, unspecified: Secondary | ICD-10-CM | POA: Diagnosis not present

## 2020-10-09 DIAGNOSIS — I10 Essential (primary) hypertension: Secondary | ICD-10-CM | POA: Diagnosis not present

## 2020-10-09 LAB — COMPREHENSIVE METABOLIC PANEL
ALT: 12 IU/L (ref 0–32)
AST: 13 IU/L (ref 0–40)
Albumin/Globulin Ratio: 1.3 (ref 1.2–2.2)
Albumin: 4 g/dL (ref 3.7–4.7)
Alkaline Phosphatase: 83 IU/L (ref 44–121)
BUN/Creatinine Ratio: 13 (ref 12–28)
BUN: 12 mg/dL (ref 8–27)
Bilirubin Total: 0.5 mg/dL (ref 0.0–1.2)
CO2: 25 mmol/L (ref 20–29)
Calcium: 9 mg/dL (ref 8.7–10.3)
Chloride: 103 mmol/L (ref 96–106)
Creatinine, Ser: 0.92 mg/dL (ref 0.57–1.00)
Globulin, Total: 3.1 g/dL (ref 1.5–4.5)
Glucose: 111 mg/dL — ABNORMAL HIGH (ref 65–99)
Potassium: 4.4 mmol/L (ref 3.5–5.2)
Sodium: 140 mmol/L (ref 134–144)
Total Protein: 7.1 g/dL (ref 6.0–8.5)
eGFR: 63 mL/min/{1.73_m2} (ref >59)

## 2020-10-09 LAB — LIPID PANEL
Chol/HDL Ratio: 4.2 ratio (ref 0.0–4.4)
Cholesterol, Total: 200 mg/dL — ABNORMAL HIGH (ref 100–199)
HDL: 48 mg/dL (ref >39)
LDL Chol Calc (NIH): 133 mg/dL — ABNORMAL HIGH (ref 0–99)
Triglycerides: 106 mg/dL (ref 0–149)
VLDL Cholesterol Cal: 19 mg/dL (ref 5–40)

## 2020-10-09 NOTE — Patient Instructions (Signed)

## 2020-10-26 ENCOUNTER — Encounter (INDEPENDENT_AMBULATORY_CARE_PROVIDER_SITE_OTHER): Payer: Self-pay | Admitting: Family Medicine

## 2020-10-26 ENCOUNTER — Ambulatory Visit (INDEPENDENT_AMBULATORY_CARE_PROVIDER_SITE_OTHER): Payer: Medicare HMO | Admitting: Family Medicine

## 2020-10-26 ENCOUNTER — Other Ambulatory Visit: Payer: Self-pay

## 2020-10-26 ENCOUNTER — Telehealth (INDEPENDENT_AMBULATORY_CARE_PROVIDER_SITE_OTHER): Payer: Self-pay

## 2020-10-26 VITALS — BP 119/70 | HR 58 | Temp 97.7°F | Ht 65.0 in | Wt 209.0 lb

## 2020-10-26 DIAGNOSIS — F418 Other specified anxiety disorders: Secondary | ICD-10-CM

## 2020-10-26 DIAGNOSIS — E559 Vitamin D deficiency, unspecified: Secondary | ICD-10-CM | POA: Diagnosis not present

## 2020-10-26 DIAGNOSIS — Z6837 Body mass index (BMI) 37.0-37.9, adult: Secondary | ICD-10-CM

## 2020-10-26 DIAGNOSIS — R7303 Prediabetes: Secondary | ICD-10-CM

## 2020-10-26 MED ORDER — VITAMIN D (ERGOCALCIFEROL) 1.25 MG (50000 UNIT) PO CAPS: 50000.0000 [IU] | ORAL_CAPSULE | ORAL | 0 refills | Status: DC

## 2020-10-26 NOTE — Telephone Encounter (Signed)
Pt called in, she stated that she was seen today and forgot to ask. The pt was wanting to know does she still need to take the Vitamin D? I told the pt I will send a message to the nurse and that we will be In touch. Please advise.

## 2020-10-28 NOTE — Telephone Encounter (Signed)
Pt notified RX was sent and to continue taking medication

## 2020-11-02 ENCOUNTER — Other Ambulatory Visit: Payer: Self-pay | Admitting: Internal Medicine

## 2020-11-02 DIAGNOSIS — N6331 Unspecified lump in axillary tail of the right breast: Secondary | ICD-10-CM

## 2020-11-02 NOTE — Progress Notes (Signed)
Chief Complaint:   OBESITY Erin Vazquez is here to discuss her progress with her obesity treatment plan along with follow-up of her obesity related diagnoses.   Today's visit was #: 11 Starting weight: 224 lbs Starting date: 12/26/2019 Today's weight: 209 lbs Today's date: 10/26/2020 Weight change since last visit: +2 lbs Total lbs lost to date: 15 lbs Body mass index is 34.78 kg/m.  Total weight loss percentage to date: -6.70%  Interim History:  Erin Vazquez is up 2 pounds due to emotional eating and depression.  She says she got back on track after her Cardiology appointment.  Lipids done at her Cardiology appointment were reviewed.  A1c 5.7 with PCP.  Current Meal Plan: the [place] for 75% of the time.  Current Exercise Plan: Cardio/strength training for 30-45 minutes 2-3 times per week.  Assessment/Plan:   Meds ordered this encounter  Medications  . Vitamin D, Ergocalciferol, (DRISDOL) 1.25 MG (50000 UNIT) CAPS capsule    Sig: Take 1 capsule (50,000 Units total) by mouth every 7 (seven) days.    Dispense:  4 capsule    Refill:  0   1. Vitamin D deficiency Not at goal. Current vitamin D is 45.0, tested on 03/31/2020. Optimal goal > 50 ng/dL.  She is taking vitamin D 50,000 IU weekly.  Plan: Continue to take prescription Vitamin D @50 ,000 IU every week as prescribed.  Follow-up for routine testing of Vitamin D, at least 2-3 times per year to avoid over-replacement.  - Refill Vitamin D, Ergocalciferol, (DRISDOL) 1.25 MG (50000 UNIT) CAPS capsule; Take 1 capsule (50,000 Units total) by mouth every 7 (seven) days.  Dispense: 4 capsule; Refill: 0  2. Prediabetes Not optimized. Goal is HgbA1c < 5.7.  Medication: None.  A1c 5.7 with PCP.  Plan:  She will continue to focus on protein-rich, low simple carbohydrate foods. We reviewed the importance of hydration, regular exercise for stress reduction, and restorative sleep.   Lab Results  Component Value Date   INSULIN 11.8  12/26/2019   3. Situational anxiety We reviewed self-care today. We will continue to monitor symptoms as they relate to her weight loss journey.  4. Obesity, current BMI 34.8  Course: Erin Vazquez is currently in the action stage of change. As such, her goal is to continue with weight loss efforts.   Nutrition goals: She has agreed to the [employer].   Exercise goals: For substantial health benefits, adults should do at least 150 minutes (2 hours and 30 minutes) a week of moderate-intensity, or 75 minutes (1 hour and 15 minutes) a week of vigorous-intensity aerobic physical activity, or an equivalent combination of moderate- and vigorous-intensity aerobic activity. Aerobic activity should be performed in episodes of at least 10 minutes, and preferably, it should be spread throughout the week.  Behavioral modification strategies: increasing lean protein intake, decreasing simple carbohydrates, increasing vegetables and increasing water intake.  Erin Vazquez has agreed to follow-up with our clinic in 4 weeks. She was informed of the importance of frequent follow-up visits to maximize her success with intensive lifestyle modifications for her multiple health conditions.   Objective:   Blood pressure 119/70, pulse (!) 58, temperature 97.7 F (36.5 C), temperature source Oral, height 5\' 5"  (1.651 m), weight 209 lb (94.8 kg), SpO2 99 %. Body mass index is 34.78 kg/m.  General: Cooperative, alert, well developed, in no acute distress. HEENT: Conjunctivae and lids unremarkable. Cardiovascular: Regular rhythm.  Lungs: Normal work of breathing. Neurologic: No focal deficits.   Lab  Results  Component Value Date   CREATININE 0.92 10/09/2020   BUN 12 10/09/2020   NA 140 10/09/2020   K 4.4 10/09/2020   CL 103 10/09/2020   CO2 25 10/09/2020   Lab Results  Component Value Date   ALT 12 10/09/2020   AST 13 10/09/2020   ALKPHOS 83 10/09/2020   BILITOT 0.5 10/09/2020   Lab Results  Component  Value Date   INSULIN 11.8 12/26/2019   Lab Results  Component Value Date   TSH 2.470 03/31/2020   Lab Results  Component Value Date   CHOL 200 (H) 10/09/2020   HDL 48 10/09/2020   LDLCALC 133 (H) 10/09/2020   TRIG 106 10/09/2020   CHOLHDL 4.2 10/09/2020   Lab Results  Component Value Date   WBC 7.7 03/31/2020   HGB 12.2 03/31/2020   HCT 40.5 03/31/2020   MCV 73 (L) 03/31/2020   PLT 260 03/31/2020   Lab Results  Component Value Date   IRON 90 03/31/2020   TIBC 299 03/31/2020   FERRITIN 187 (H) 03/31/2020   Obesity Behavioral Intervention:   Approximately 15 minutes were spent on the discussion below.  ASK: We discussed the diagnosis of obesity with Erin Vazquez today and Erin Vazquez agreed to give [name] permission to discuss obesity behavioral modification therapy today.  ASSESS: Erin Vazquez has the diagnosis of obesity and her BMI today is 34.8. Erin Vazquez is in the action stage of change.   ADVISE: Erin Vazquez was educated on the multiple health risks of obesity as well as the benefit of weight loss to improve her health. She was advised of the need for long term treatment and the importance of lifestyle modifications to improve her current health and to decrease her risk of future health problems.  AGREE: Multiple dietary modification options and treatment options were discussed and Erin Vazquez agreed to follow the recommendations documented in the above note.  ARRANGE: Erin Vazquez was educated on the importance of frequent visits to treat obesity as outlined per CMS and USPSTF guidelines and agreed to schedule her next follow up appointment today.  Attestation Statements:   Reviewed by clinician on day of visit: allergies, medications, problem list, medical history, surgical history, family history, social history, and previous encounter notes.  I, Water quality scientist, CMA, am acting as transcriptionist for Erin Deutscher, DO  I have reviewed the above documentation for accuracy and completeness, and I agree with  the above. Erin Deutscher, DO

## 2020-11-25 ENCOUNTER — Ambulatory Visit (INDEPENDENT_AMBULATORY_CARE_PROVIDER_SITE_OTHER): Payer: Medicare HMO | Admitting: Family Medicine

## 2020-12-02 ENCOUNTER — Ambulatory Visit
Admission: RE | Admit: 2020-12-02 | Discharge: 2020-12-02 | Disposition: A | Discharge status: Home / Self Care | Payer: Medicare HMO | Source: Ambulatory Visit | Attending: Internal Medicine | Admitting: Internal Medicine

## 2020-12-02 ENCOUNTER — Other Ambulatory Visit: Payer: Self-pay

## 2020-12-02 DIAGNOSIS — N6331 Unspecified lump in axillary tail of the right breast: Secondary | ICD-10-CM

## 2020-12-18 ENCOUNTER — Encounter (HOSPITAL_COMMUNITY): Payer: Self-pay | Admitting: Emergency Medicine

## 2020-12-18 ENCOUNTER — Emergency Department (HOSPITAL_COMMUNITY)
Admission: EM | Admit: 2020-12-18 | Discharge: 2020-12-18 | Disposition: A | Discharge status: Home / Self Care | Payer: Medicare HMO | Attending: Emergency Medicine | Admitting: Emergency Medicine

## 2020-12-18 ENCOUNTER — Other Ambulatory Visit: Payer: Self-pay

## 2020-12-18 DIAGNOSIS — Z79899 Other long term (current) drug therapy: Secondary | ICD-10-CM | POA: Insufficient documentation

## 2020-12-18 DIAGNOSIS — W272XXA Contact with scissors, initial encounter: Secondary | ICD-10-CM | POA: Insufficient documentation

## 2020-12-18 DIAGNOSIS — E039 Hypothyroidism, unspecified: Secondary | ICD-10-CM | POA: Diagnosis not present

## 2020-12-18 DIAGNOSIS — I10 Essential (primary) hypertension: Secondary | ICD-10-CM | POA: Diagnosis not present

## 2020-12-18 DIAGNOSIS — Z87891 Personal history of nicotine dependence: Secondary | ICD-10-CM | POA: Diagnosis not present

## 2020-12-18 DIAGNOSIS — S8991XA Unspecified injury of right lower leg, initial encounter: Secondary | ICD-10-CM | POA: Diagnosis present

## 2020-12-18 DIAGNOSIS — Z23 Encounter for immunization: Secondary | ICD-10-CM | POA: Insufficient documentation

## 2020-12-18 DIAGNOSIS — S81811A Laceration without foreign body, right lower leg, initial encounter: Secondary | ICD-10-CM

## 2020-12-18 MED ORDER — TETANUS-DIPHTH-ACELL PERTUSSIS 5-2.5-18.5 LF-MCG/0.5 IM SUSY
0.5000 mL | PREFILLED_SYRINGE | Freq: Once | INTRAMUSCULAR | Status: AC
  Administered 2020-12-18: 0.5 mL via INTRAMUSCULAR
  Filled 2020-12-18: qty 0.5

## 2020-12-18 MED ORDER — LIDOCAINE-EPINEPHRINE (PF) 2 %-1:200000 IJ SOLN
10.0000 mL | Freq: Once | INTRAMUSCULAR | Status: DC
  Filled 2020-12-18: qty 20

## 2020-12-18 MED ORDER — ACETAMINOPHEN 500 MG PO TABS
1000.0000 mg | ORAL_TABLET | Freq: Once | ORAL | Status: AC
  Administered 2020-12-18: 1000 mg via ORAL
  Filled 2020-12-18: qty 2

## 2020-12-18 NOTE — ED Provider Notes (Addendum)
[facility] EMERGENCY DEPARTMENT Provider Note   CSN: [id number] Arrival date & time: 12/18/20  1550     History Chief Complaint  Patient presents with   Laceration    Erin Vazquez is a 80 y.o. female with medical history as listed below.  Patient presents emergency department with a chief complaint of laceration.  Laceration is 2 left lower extremity.  Patient reports that laceration occurred approximately 1530 this afternoon.  Patient states that she accidentally cut herself on hedge shears.  Patient endorses pain to laceration site.  Patient rates pain 8/10 on the pain scale.  Pain is worse with movement or touch.  Patient has not tried any modalities to alleviate her pain.  Bleeding was controlled with direct pressure.  Patient is not on any blood thinners.  Patient is unsure when her last tetanus shot was.   Laceration Associated symptoms: no rash       Past Medical History:  Diagnosis Date   Adrenal nodule ([place])    Arthritis    Back pain    Bunion, left foot    Eczema    Fibrocystic breast    Left   Fibroids, endometrial and uterine    Gallstones    Genital herpes    Hammer toe of left foot    Heart valve problem    History of cardiomegaly    History of colonic diverticulitis    History of iron deficiency anemia    HTN (hypertension)    Hypothyroid    Joint pain    Lower extremity edema    Mild calcific aortic stenosis    Osteoarthritis    Ovarian cyst    pt unaware   Pre-diabetes    Sigmoid diverticulosis    Venous insufficiency     Patient Active Problem List   Diagnosis Date Noted   Cyst of ovary, right 08/25/2020   Vaginal irritation 08/25/2020   Screening examination for STD (sexually transmitted disease) 08/25/2020   BV (bacterial vaginosis) 08/29/2019   Vaginal discharge 08/29/2019   Absence of bladder continence 08/29/2019   PMB (postmenopausal bleeding) 08/29/2019   Closed fracture of left distal radius 04/23/2018   Paresthesias in  left hand 03/20/2015   KNEE, ARTHRITIS, DEGEN./OSTEO 01/28/2009   DERANGEMENT MENISCUS 01/28/2009   KNEE PAIN 01/28/2009   UTI 11/26/2008   Morbid obesity ([place]) 02/11/2008   MUSCLE CRAMPS 02/11/2008   Essential hypertension 12/25/2007   CONTACT DERMATITIS 12/25/2007   LEG PAIN 12/25/2007   DISTURBANCE OF SKIN SENSATION 12/25/2007   OTITIS MEDIA, LEFT 10/02/2007   FLATULENCE 10/02/2007   Lipoma of unspecified site 09/03/2007   ALLERGIC RHINITIS 09/03/2007   DISORDER, SKIN NEC 01/04/2007   BURSITIS, LEFT KNEE 01/04/2007   HYPOTHYROIDISM 12/25/2006   FIBROCYSTIC BREAST DISEASE 12/25/2006   OVARIAN CYST 12/25/2006   DYSPLASIA, CERVIX NOS 12/25/2006   ARTHRITIS 12/25/2006   LEG EDEMA 12/25/2006   ELECTROCARDIOGRAM, ABNORMAL 12/25/2006    Past Surgical History:  Procedure Laterality Date   BREAST RECONSTRUCTION  1976   BREAST SURGERY Left 1961   Removed 3 fibroadenoma    CERVICAL CONE BIOPSY  1981   KNEE ARTHROSCOPY Left 2010 Dr. Aline Brochure   MASTECTOMY, PARTIAL Left 1979   Subtotal   OPEN REDUCTION INTERNAL FIXATION (ORIF) DISTAL RADIAL FRACTURE Left 04/24/2018   Procedure: OPEN REDUCTION INTERNAL FIXATION (ORIF) LEFT DISTAL RADIAL FRACTURE;  Surgeon: Renette Butters, MD;  Location: [facility];  Service: Orthopedics;  Laterality: Left;   SHOULDER SURGERY Right  1976, 11/2017   lipoma excision   TUBAL LIGATION     venaseal       OB History     Gravida  6   Para  6   Term  4   Preterm  2   AB      Living  6      SAB      IAB      Ectopic      Multiple      Live Births              Family History  Problem Relation Age of Onset   Diabetes Mother    Hypertension Mother    Dementia Mother    Stroke Mother    Thyroid disease Mother    Cancer Father        pancreatic   Multiple sclerosis Sister    Drug abuse Brother    Crohn's disease Brother    Diabetes Maternal Grandmother    Vision loss Maternal Grandmother    Cancer Maternal  Grandfather        prostate   Cancer Paternal Grandmother        pancreatic    Social History   Tobacco Use   Smoking status: Former    Packs/day: 1.00    Years: 4.00    Pack years: 4.00    Types: Cigarettes    Quit date: 06/13/1965    Years since quitting: 55.5   Smokeless tobacco: Never  Vaping Use   Vaping Use: Never used  Substance Use Topics   Alcohol use: No   Drug use: No    Home Medications Prior to Admission medications   Medication Sig Start Date End Date Taking? Authorizing Provider  ferrous sulfate 325 (65 FE) MG tablet Take 325 mg by mouth daily with breakfast.    [provider]  GARLIC PO Take by mouth.    [provider]  levothyroxine (SYNTHROID, LEVOTHROID) 75 MCG tablet Take 75 mcg by mouth daily.    [provider]  losartan-hydrochlorothiazide (HYZAAR) 50-12.5 MG per tablet Take 2 tablets by mouth daily.  08/18/13   [provider]  MAGNESIUM PO Take by mouth daily.    [provider]  Multiple Vitamin (MULTIVITAMIN) tablet Take 1 tablet by mouth daily.    [provider]  Omega-3 Fatty Acids (FISH OIL PO) Take by mouth.    [provider]  OVER THE COUNTER MEDICATION Med Name: Zyflamend    [provider]  UNABLE TO FIND Med Name: Vital Reds    [provider]  UNABLE TO FIND Med Name: Total Restore    [provider]  UNABLE TO FIND Bio complete 3-daily    [provider]  Vitamin D, Ergocalciferol, (DRISDOL) 1.25 MG (50000 UNIT) CAPS capsule Take 1 capsule (50,000 Units total) by mouth every 7 (seven) days. 10/26/20   Briscoe Deutscher, DO    Allergies    Patient has no known allergies.  Review of Systems   Review of Systems  Musculoskeletal:  Positive for myalgias.  Skin:  Positive for wound. Negative for color change, pallor and rash.  Neurological:  Negative for weakness and numbness.   Physical Exam Updated Vital Signs BP (!) 159/65 (BP Location:  Left Arm)   Pulse 63   Temp 98.4 F (36.9 C) (Oral)   Resp 18   Ht 5\' 5"  (1.651 m)   Wt 97.1 kg   SpO2 100%  BMI 35.61 kg/m   Physical Exam Vitals and nursing note reviewed.  Constitutional:      General: She is not in acute distress.    Appearance: She is not ill-appearing, toxic-appearing or diaphoretic.  HENT:     Head: Normocephalic.  Eyes:     General: No scleral icterus.       Right eye: No discharge.        Left eye: No discharge.  Cardiovascular:     Rate and Rhythm: Normal rate.     Pulses:          Dorsalis pedis pulses are 2+ on the right side.  Pulmonary:     Effort: Pulmonary effort is normal.  Musculoskeletal:     Right upper leg: Laceration and tenderness (to laceration) present. No swelling, edema, deformity or bony tenderness.     Right knee: No swelling, deformity, effusion, erythema, ecchymosis, lacerations, bony tenderness or crepitus. Normal range of motion. No tenderness. Normal alignment.     Right foot: Normal range of motion and normal capillary refill. No swelling, deformity, laceration, tenderness, bony tenderness or crepitus.     Comments: 4.5 cm linear laceration with straight edges to lateral aspect of right lower extremity distal to knee.  No involvement with knee joint.  No foreign bodies appreciated.  Wound is clean.  Feet:     Right foot:     Skin integrity: Skin integrity normal.     Toenail Condition: Right toenails are normal.  Skin:    General: Skin is warm and dry.  Neurological:     General: No focal deficit present.     Mental Status: She is alert.     GCS: GCS eye subscore is 4. GCS verbal subscore is 5. GCS motor subscore is 6.  Psychiatric:        Behavior: Behavior is cooperative.      ED Results / Procedures / Treatments   Labs (all labs ordered are listed, but only abnormal results are displayed) Labs Reviewed - No data to display  EKG None  Radiology No results found.  Procedures .Marland KitchenLaceration  Repair  Date/Time: 12/18/2020 9:24 PM Performed by: Loni Beckwith, PA-C Authorized by: Loni Beckwith, PA-C   Consent:    Consent obtained:  Verbal   Consent given by:  Patient   Risks discussed:  Infection, need for additional repair, pain, poor cosmetic result, poor wound healing and retained foreign body   Alternatives discussed:  No treatment and delayed treatment Universal protocol:    Procedure explained and questions answered to patient or proxy's satisfaction: yes     Patient identity confirmed:  Verbally with patient and arm band Anesthesia:    Anesthesia method:  Local infiltration   Local anesthetic:  Lidocaine 1% WITH epi Laceration details:    Location:  Leg   Leg location:  R upper leg   Length (cm):  4.5   Depth (mm):  10 Pre-procedure details:    Preparation:  Patient was prepped and draped in usual sterile fashion Exploration:    Wound exploration: wound explored through full range of motion and entire depth of wound visualized     Wound extent: no foreign bodies/material noted   Treatment:    Area cleansed with:  Povidone-iodine   Amount of cleaning:  Standard   Irrigation solution:  Sterile saline   Irrigation method:  Syringe Skin repair:    Repair method:  Sutures   Suture size:  6-0 and 3-0  Suture material:  Prolene   Suture technique:  Simple interrupted   Number of sutures:  6 Approximation:    Approximation:  Close Repair type:    Repair type:  Simple Post-procedure details:    Dressing:  Sterile dressing   Procedure completion:  Tolerated well, no immediate complications   Medications Ordered in ED Medications  lidocaine-EPINEPHrine (XYLOCAINE W/EPI) 2 %-1:200000 (PF) injection 10 mL (has no administration in time range)  Tdap (BOOSTRIX) injection 0.5 mL (0.5 mLs Intramuscular Given 12/18/20 1828)  acetaminophen (TYLENOL) tablet 1,000 mg (1,000 mg Oral Given 12/18/20 1926)    ED Course  I have reviewed the triage vital signs and  the nursing notes.  Pertinent labs & imaging results that were available during my care of the patient were reviewed by me and considered in my medical decision making (see chart for details).    MDM Rules/Calculators/A&P                          Alert 80 year old female no acute distress, nontoxic-appearing.  Patient presents emerged part with chief plaint of laceration.  Patient excellently cut herself at approximately 1530 this afternoon with garden shears.  Laceration as described and pictured above.  No involvement with right knee joint.  No foreign bodies.  Entire depth of wound was visualized in a clear and bloodless field.  Entire depth of wound was visualized through full range of motion to right knee.  Wound is not contaminated.  Pulse, motor, and sensation intact distally.  Patient's tetanus vaccination updated.  Suture procedure as noted above.  Patient tolerated procedure well.  Patient will return in 10 to 14 days to have sutures removed.  Patient given strict return precautions.  Patient expressed understanding of all instructions and is agreeable with this plan.  Final Clinical Impression(s) / ED Diagnoses Final diagnoses:  None    Rx / DC Orders ED Discharge Orders     None        Loni Beckwith, PA-C 12/18/20 2125    Loni Beckwith, PA-C 12/18/20 2126    Isla Pence, MD 12/18/20 2303

## 2020-12-18 NOTE — ED Triage Notes (Signed)
Pt has laceration to right knee. Bleeding controlled at this time

## 2020-12-18 NOTE — ED Notes (Signed)
telfa and medipore tape applied

## 2020-12-18 NOTE — Discharge Instructions (Addendum)
You came to the emergency room today to be evaluated for your leg laceration.  Your laceration required sutures.  These keep the sutures dry for the next 24 hours.  After that meeting you may gently wash the area with soap and water.  Please do not submerge the area under water until the stitches are removed.  You will need to return to your primary care doctor, urgent care, or emergency department in 10 to 14 days to have the stitches removed.  Get help right away if: You develop severe swelling around the wound. Your pain suddenly increases and is severe. You develop painful lumps near the wound or on skin anywhere else on your body. You have a red streak going away from your wound. The wound is on your hand or foot and you cannot properly move a finger or toe. The wound is on your hand or foot, and you notice that your fingers or toes look pale or bluish.

## 2020-12-24 ENCOUNTER — Other Ambulatory Visit (INDEPENDENT_AMBULATORY_CARE_PROVIDER_SITE_OTHER): Payer: Self-pay | Admitting: Family Medicine

## 2020-12-24 ENCOUNTER — Ambulatory Visit (INDEPENDENT_AMBULATORY_CARE_PROVIDER_SITE_OTHER): Payer: Medicare HMO | Admitting: Family Medicine

## 2020-12-24 ENCOUNTER — Encounter (INDEPENDENT_AMBULATORY_CARE_PROVIDER_SITE_OTHER): Payer: Self-pay | Admitting: Family Medicine

## 2020-12-24 ENCOUNTER — Other Ambulatory Visit: Payer: Self-pay

## 2020-12-24 VITALS — BP 133/78 | HR 69 | Temp 97.9°F | Ht 65.0 in | Wt 209.0 lb

## 2020-12-24 DIAGNOSIS — R7303 Prediabetes: Secondary | ICD-10-CM

## 2020-12-24 DIAGNOSIS — E559 Vitamin D deficiency, unspecified: Secondary | ICD-10-CM

## 2020-12-24 DIAGNOSIS — Z6836 Body mass index (BMI) 36.0-36.9, adult: Secondary | ICD-10-CM

## 2020-12-24 DIAGNOSIS — E66812 Obesity, class 2: Secondary | ICD-10-CM

## 2020-12-24 MED ORDER — VITAMIN D (ERGOCALCIFEROL) 1.25 MG (50000 UNIT) PO CAPS: 50000.0000 [IU] | ORAL_CAPSULE | ORAL | 0 refills | Status: DC

## 2020-12-31 NOTE — Progress Notes (Signed)
Chief Complaint:   OBESITY Erin Vazquez is here to discuss her progress with her obesity treatment plan along with follow-up of her obesity related diagnoses. Erin Vazquez is on the [employer] and states she is following her eating plan approximately 75% of the time. Erin Vazquez states she is going to the [employer] 30-60 minutes 2 times per week.  Today's visit was #: 12 Starting weight: 224 lbs Starting date: 12/26/2019 Today's weight: 209 lbs Today's date: 12/24/2020 Total lbs lost to date: 15 Total lbs lost since last in-office visit: 0  Interim History: Erin Vazquez has had to decrease physical activity due to foot cut with glass and then leg cut due to pruning shears. She got a shoulder injection 2 days ago. She is eating pescatarian diet almost all the time. The hardest part of sticking to the plan is all the things she has to do- pack food to take with her and occasionally she doesn't. She is making a pea protein to make a smoothie with almond milk. She not eating or drinking dairy. Goal is to transition to vegan. She is eating old fashioned oats with stevia, blackberry, raspberry, and nuts for breakfast.  Subjective:   1. Pre-diabetes Erin Vazquez's A1c was 5.7 yesterday. She is not on medication.  2. Vitamin D deficiency Pt denies nausea, vomiting, and muscle weakness but notes fatigue. She is on prescription Vit D. Her last Vit D level was 45.0.  Assessment/Plan:   1. Pre-diabetes Erin Vazquez will continue to work on weight loss, exercise, and decreasing simple carbohydrates to help decrease the risk of diabetes. Repeat labs in 3-4 months.  2. Vitamin D deficiency Low Vitamin D level contributes to fatigue and are associated with obesity, breast, and colon cancer. She agrees to continue to take prescription Vitamin D @50 ,000 IU every week and will follow-up for routine testing of Vitamin D, at least 2-3 times per year to avoid over-replacement.  Refill- Vitamin D, Ergocalciferol, (DRISDOL) 1.25 MG (50000  UNIT) CAPS capsule; Take 1 capsule (50,000 Units total) by mouth every 7 (seven) days.  Dispense: 4 capsule; Refill: 0  3. Obesity Current BMI 34.8  Erin Vazquez is currently in the action stage of change. As such, her goal is to continue with weight loss efforts. She has agreed to the [employer].   Exercise goals:  As is  Behavioral modification strategies: increasing lean protein intake, no skipping meals, and meal planning and cooking strategies.  Erin Vazquez has agreed to follow-up with our clinic in 3 weeks. She was informed of the importance of frequent follow-up visits to maximize her success with intensive lifestyle modifications for her multiple health conditions.   Objective:   Blood pressure 133/78, pulse 69, temperature 97.9 F (36.6 C), height 5\' 5"  (1.651 m), weight 209 lb (94.8 kg), SpO2 99 %. Body mass index is 34.78 kg/m.  General: Cooperative, alert, well developed, in no acute distress. HEENT: Conjunctivae and lids unremarkable. Cardiovascular: Regular rhythm.  Lungs: Normal work of breathing. Neurologic: No focal deficits.   Lab Results  Component Value Date   CREATININE 0.92 10/09/2020   BUN 12 10/09/2020   NA 140 10/09/2020   K 4.4 10/09/2020   CL 103 10/09/2020   CO2 25 10/09/2020   Lab Results  Component Value Date   ALT 12 10/09/2020   AST 13 10/09/2020   ALKPHOS 83 10/09/2020   BILITOT 0.5 10/09/2020   No results found for: HGBA1C Lab Results  Component Value Date   INSULIN 11.8 12/26/2019  Lab Results  Component Value Date   TSH 2.470 03/31/2020   Lab Results  Component Value Date   CHOL 200 (H) 10/09/2020   HDL 48 10/09/2020   LDLCALC 133 (H) 10/09/2020   TRIG 106 10/09/2020   CHOLHDL 4.2 10/09/2020   Lab Results  Component Value Date   VD25OH 45.0 03/31/2020   VD25OH 24.4 (L) 12/26/2019   Lab Results  Component Value Date   WBC 7.7 03/31/2020   HGB 12.2 03/31/2020   HCT 40.5 03/31/2020   MCV 73 (L) 03/31/2020   PLT 260  03/31/2020   Lab Results  Component Value Date   IRON 90 03/31/2020   TIBC 299 03/31/2020   FERRITIN 187 (H) 03/31/2020    Obesity Behavioral Intervention:   Approximately 15 minutes were spent on the discussion below.  ASK: We discussed the diagnosis of obesity with Erin Vazquez today and Erin Vazquez agreed to give [name] permission to discuss obesity behavioral modification therapy today.  ASSESS: Erin Vazquez has the diagnosis of obesity and her BMI today is 34.8. Erin Vazquez is in the action stage of change.   ADVISE: Erin Vazquez was educated on the multiple health risks of obesity as well as the benefit of weight loss to improve her health. She was advised of the need for long term treatment and the importance of lifestyle modifications to improve her current health and to decrease her risk of future health problems.  AGREE: Multiple dietary modification options and treatment options were discussed and Erin Vazquez agreed to follow the recommendations documented in the above note.  ARRANGE: Erin Vazquez was educated on the importance of frequent visits to treat obesity as outlined per CMS and USPSTF guidelines and agreed to schedule her next follow up appointment today.  Attestation Statements:   Reviewed by clinician on day of visit: allergies, medications, problem list, medical history, surgical history, family history, social history, and previous encounter notes.  Coral Ceo, CMA, am acting as transcriptionist for Coralie Common, MD.  I have reviewed the above documentation for accuracy and completeness, and I agree with the above. - Coralie Common, MD

## 2021-01-26 ENCOUNTER — Ambulatory Visit (INDEPENDENT_AMBULATORY_CARE_PROVIDER_SITE_OTHER): Payer: Medicare HMO | Admitting: Family Medicine

## 2021-01-26 ENCOUNTER — Other Ambulatory Visit: Payer: Self-pay

## 2021-01-26 ENCOUNTER — Encounter (INDEPENDENT_AMBULATORY_CARE_PROVIDER_SITE_OTHER): Payer: Self-pay | Admitting: Family Medicine

## 2021-01-26 VITALS — BP 134/70 | HR 58 | Temp 98.1°F | Ht 65.0 in | Wt 215.0 lb

## 2021-01-26 DIAGNOSIS — E559 Vitamin D deficiency, unspecified: Secondary | ICD-10-CM | POA: Diagnosis not present

## 2021-01-26 DIAGNOSIS — Z6837 Body mass index (BMI) 37.0-37.9, adult: Secondary | ICD-10-CM

## 2021-01-26 DIAGNOSIS — R7303 Prediabetes: Secondary | ICD-10-CM

## 2021-01-26 DIAGNOSIS — F418 Other specified anxiety disorders: Secondary | ICD-10-CM | POA: Diagnosis not present

## 2021-01-26 MED ORDER — VITAMIN D (ERGOCALCIFEROL) 1.25 MG (50000 UNIT) PO CAPS: 50000.0000 [IU] | ORAL_CAPSULE | ORAL | 0 refills | Status: DC

## 2021-01-28 NOTE — Progress Notes (Signed)
Chief Complaint:   OBESITY Erin Vazquez is here to discuss her progress with her obesity treatment plan along with follow-up of her obesity related diagnoses.   Today's visit was #: 68 Starting weight: 224 lbs Starting date: 12/26/2019 Today's weight: 215 lbs Today's date: 01/26/2021 Weight change since last visit: 0 lbs Total lbs lost to date: 15 lbs Body mass index is 34.78 kg/m.  Total weight loss percentage to date: -4.02%  Current Meal Plan: the [place] for 50% of the time.  Current Exercise Plan: None at this time. Current Anti-Obesity Medications: None at this time.  Interim History: Erin Vazquez has been having a hard time getting in her 85 grams of protein because she is wanting to avoid eating meat. She is also not able to exercise, because she is having knee and shoulder pain, and she has healing wounds to her foot and leg.   Assessment/Plan:   1. Vitamin D deficiency Not at goal.   Plan: Continue to take prescription Vitamin D '@50'$ ,000 IU every week as prescribed.  Follow-up for routine testing of Vitamin D, at least 2-3 times per year to avoid over-replacement.  Lab Results  Component Value Date   VD25OH 45.0 03/31/2020   VD25OH 24.4 (L) 12/26/2019   Refill: - Vitamin D, Ergocalciferol, (DRISDOL) 1.25 MG (50000 UNIT) CAPS capsule; Take 1 capsule (50,000 Units total) by mouth every 7 (seven) days.  Dispense: 4 capsule; Refill: 0  2. Pre-diabetes Not optimized. Goal is HgbA1c < 5.7.  Medication: None.  Plan:  She will continue to focus on protein-rich, low simple carbohydrate foods. We reviewed the importance of hydration, regular exercise for stress reduction, and restorative sleep.   3. Situational anxiety We reviewed self-care today. We will continue to monitor symptoms as they relate to her weight loss journey.  4. Obesity, current BMI 35.8  Course: Erin Vazquez is currently in the action stage of change. As such, her goal is to continue with weight loss  efforts.   Nutrition goals: She has agreed to the [facility], and protein suggestions were given to her.   Exercise goals:  chair exercises  Behavioral modification strategies: increasing lean protein intake, decreasing simple carbohydrates, increasing vegetables, and increasing water intake.  Erin Vazquez has agreed to follow-up with our clinic in 4 weeks. She was informed of the importance of frequent follow-up visits to maximize her success with intensive lifestyle modifications for her multiple health conditions.     Objective:   Blood pressure 134/70, pulse (!) 58, temperature 98.1 F (36.7 C), height '5\' 5"'$  (1.651 m), SpO2 100 %. Body mass index is 34.78 kg/m.  General: Cooperative, alert, well developed, in no acute distress. HEENT: Conjunctivae and lids unremarkable. Cardiovascular: Regular rhythm.  Lungs: Normal work of breathing. Neurologic: No focal deficits.   Lab Results  Component Value Date   CREATININE 0.92 10/09/2020   BUN 12 10/09/2020   NA 140 10/09/2020   K 4.4 10/09/2020   CL 103 10/09/2020   CO2 25 10/09/2020   Lab Results  Component Value Date   ALT 12 10/09/2020   AST 13 10/09/2020   ALKPHOS 83 10/09/2020   BILITOT 0.5 10/09/2020   No results found for: HGBA1C Lab Results  Component Value Date   INSULIN 11.8 12/26/2019   Lab Results  Component Value Date   TSH 2.470 03/31/2020   Lab Results  Component Value Date   CHOL 200 (H) 10/09/2020   HDL 48 10/09/2020   LDLCALC 133 (H) 10/09/2020  TRIG 106 10/09/2020   CHOLHDL 4.2 10/09/2020   Lab Results  Component Value Date   VD25OH 45.0 03/31/2020   VD25OH 24.4 (L) 12/26/2019   Lab Results  Component Value Date   WBC 7.7 03/31/2020   HGB 12.2 03/31/2020   HCT 40.5 03/31/2020   MCV 73 (L) 03/31/2020   PLT 260 03/31/2020   Lab Results  Component Value Date   IRON 90 03/31/2020   TIBC 299 03/31/2020   FERRITIN 187 (H) 03/31/2020    Obesity Behavioral Intervention:    Approximately 15 minutes were spent on the discussion below.  ASK: We discussed the diagnosis of obesity with Erin Vazquez today and Erin Vazquez agreed to give [name] permission to discuss obesity behavioral modification therapy today.  ASSESS: Erin Vazquez has the diagnosis of obesity and her BMI today is 35.8. Erin Vazquez is in the action stage of change.   ADVISE: Erin Vazquez was educated on the multiple health risks of obesity as well as the benefit of weight loss to improve her health. She was advised of the need for long term treatment and the importance of lifestyle modifications to improve her current health and to decrease her risk of future health problems.  AGREE: Multiple dietary modification options and treatment options were discussed and Erin Vazquez agreed to follow the recommendations documented in the above note.  ARRANGE: Erin Vazquez was educated on the importance of frequent visits to treat obesity as outlined per CMS and USPSTF guidelines and agreed to schedule her next follow up appointment today.  Attestation Statements:   Reviewed by clinician on day of visit: allergies, medications, problem list, medical history, surgical history, family history, social history, and previous encounter notes.  I, Erin Mincy,LPN am acting as Location manager for [employer], DO.  I have reviewed the above documentation for accuracy and completeness, and I agree with the above. Briscoe Deutscher, DO

## 2021-02-18 ENCOUNTER — Other Ambulatory Visit: Payer: Self-pay | Admitting: Internal Medicine

## 2021-02-18 LAB — COMPLETE METABOLIC PANEL WITH GFR
AG Ratio: 1.4 (calc) (ref 1.0–2.5)
ALT: 14 U/L (ref 6–29)
AST: 26 U/L (ref 10–35)
Albumin: 4 g/dL (ref 3.6–5.1)
Alkaline phosphatase (APISO): 69 U/L (ref 37–153)
BUN: 18 mg/dL (ref 7–25)
CO2: 21 mmol/L (ref 20–32)
Calcium: 8.7 mg/dL (ref 8.6–10.4)
Chloride: 105 mmol/L (ref 98–110)
Creat: 0.78 mg/dL (ref 0.60–1.00)
Globulin: 2.9 g/dL (calc) (ref 1.9–3.7)
Glucose, Bld: 114 mg/dL — ABNORMAL HIGH (ref 65–99)
Potassium: 4.1 mmol/L (ref 3.5–5.3)
Sodium: 137 mmol/L (ref 135–146)
Total Bilirubin: 0.9 mg/dL (ref 0.2–1.2)
Total Protein: 6.9 g/dL (ref 6.1–8.1)
eGFR: 77 mL/min/{1.73_m2} (ref >=60)

## 2021-02-18 LAB — LIPID PANEL
Cholesterol: 178 mg/dL (ref <200)
HDL: 47 mg/dL — ABNORMAL LOW (ref >=50)
LDL Cholesterol (Calc): 114 mg/dL (calc) — ABNORMAL HIGH
Non-HDL Cholesterol (Calc): 131 mg/dL (calc) — ABNORMAL HIGH (ref <130)
Total CHOL/HDL Ratio: 3.8 (calc) (ref <5.0)
Triglycerides: 76 mg/dL (ref <150)

## 2021-02-18 LAB — CBC
HCT: 38.6 % (ref 35.0–45.0)
Hemoglobin: 11.8 g/dL (ref 11.7–15.5)
MCH: 22.5 pg — ABNORMAL LOW (ref 27.0–33.0)
MCHC: 30.6 g/dL — ABNORMAL LOW (ref 32.0–36.0)
MCV: 73.7 fL — ABNORMAL LOW (ref 80.0–100.0)
MPV: 11.3 fL (ref 7.5–12.5)
Platelets: 242 10*3/uL (ref 140–400)
RBC: 5.24 10*6/uL — ABNORMAL HIGH (ref 3.80–5.10)
RDW: 15.3 % — ABNORMAL HIGH (ref 11.0–15.0)
WBC: 6 10*3/uL (ref 3.8–10.8)

## 2021-02-18 LAB — TSH: TSH: 1.96 mIU/L (ref 0.40–4.50)

## 2021-02-18 LAB — T4, FREE: Free T4: 1.4 ng/dL (ref 0.8–1.8)

## 2021-02-22 ENCOUNTER — Emergency Department (HOSPITAL_COMMUNITY)
Admission: EM | Admit: 2021-02-22 | Discharge: 2021-02-22 | Disposition: A | Discharge status: Home / Self Care | Payer: Medicare HMO | Attending: Emergency Medicine | Admitting: Emergency Medicine

## 2021-02-22 ENCOUNTER — Other Ambulatory Visit: Payer: Self-pay

## 2021-02-22 ENCOUNTER — Encounter (HOSPITAL_COMMUNITY): Payer: Self-pay | Admitting: *Deleted

## 2021-02-22 DIAGNOSIS — M79672 Pain in left foot: Secondary | ICD-10-CM | POA: Diagnosis present

## 2021-02-22 DIAGNOSIS — Z5321 Procedure and treatment not carried out due to patient leaving prior to being seen by health care provider: Secondary | ICD-10-CM | POA: Insufficient documentation
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
## 2021-02-22 NOTE — ED Notes (Signed)
Reported that left at 2204

## 2021-02-23 ENCOUNTER — Emergency Department (HOSPITAL_COMMUNITY): Payer: Medicare HMO

## 2021-02-23 ENCOUNTER — Encounter (HOSPITAL_COMMUNITY): Payer: Self-pay

## 2021-02-23 ENCOUNTER — Other Ambulatory Visit: Payer: Self-pay

## 2021-02-23 ENCOUNTER — Emergency Department (HOSPITAL_COMMUNITY)
Admission: EM | Admit: 2021-02-23 | Discharge: 2021-02-23 | Disposition: A | Discharge status: Home / Self Care | Payer: Medicare HMO | Attending: Emergency Medicine | Admitting: Emergency Medicine

## 2021-02-23 DIAGNOSIS — I10 Essential (primary) hypertension: Secondary | ICD-10-CM | POA: Insufficient documentation

## 2021-02-23 DIAGNOSIS — Z87891 Personal history of nicotine dependence: Secondary | ICD-10-CM | POA: Insufficient documentation

## 2021-02-23 DIAGNOSIS — M79672 Pain in left foot: Secondary | ICD-10-CM | POA: Insufficient documentation

## 2021-02-23 DIAGNOSIS — E039 Hypothyroidism, unspecified: Secondary | ICD-10-CM | POA: Insufficient documentation

## 2021-02-23 MED ORDER — OXYCODONE-ACETAMINOPHEN 5-325 MG PO TABS: 1.0000 | ORAL_TABLET | Freq: Four times a day (QID) | ORAL | 0 refills | Status: AC | PRN

## 2021-02-23 MED ORDER — OXYCODONE-ACETAMINOPHEN 5-325 MG PO TABS
1.0000 | ORAL_TABLET | Freq: Once | ORAL | Status: AC
  Administered 2021-02-23: 1 via ORAL
  Filled 2021-02-23: qty 1
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
## 2021-02-23 NOTE — Discharge Instructions (Addendum)
Use a walker to ambulate with much as possible.  And try to stay off your leg is much as possible.  Follow-up with your orthopedic doctor by next week
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
## 2021-03-02 ENCOUNTER — Ambulatory Visit (INDEPENDENT_AMBULATORY_CARE_PROVIDER_SITE_OTHER): Payer: Medicare HMO | Admitting: Family Medicine

## 2021-04-06 ENCOUNTER — Ambulatory Visit (INDEPENDENT_AMBULATORY_CARE_PROVIDER_SITE_OTHER): Payer: Medicare HMO | Admitting: Family Medicine

## 2021-05-10 ENCOUNTER — Ambulatory Visit (INDEPENDENT_AMBULATORY_CARE_PROVIDER_SITE_OTHER): Payer: Medicare HMO | Admitting: Family Medicine

## 2021-05-25 ENCOUNTER — Ambulatory Visit (INDEPENDENT_AMBULATORY_CARE_PROVIDER_SITE_OTHER): Payer: Medicare HMO

## 2021-05-25 ENCOUNTER — Ambulatory Visit: Payer: Medicare HMO

## 2021-05-25 ENCOUNTER — Ambulatory Visit: Payer: Medicare HMO | Admitting: Podiatry

## 2021-05-25 ENCOUNTER — Other Ambulatory Visit: Payer: Self-pay

## 2021-05-25 DIAGNOSIS — M25372 Other instability, left ankle: Secondary | ICD-10-CM | POA: Diagnosis not present

## 2021-05-25 DIAGNOSIS — M25572 Pain in left ankle and joints of left foot: Secondary | ICD-10-CM

## 2021-05-25 DIAGNOSIS — M25472 Effusion, left ankle: Secondary | ICD-10-CM | POA: Diagnosis not present

## 2021-05-25 DIAGNOSIS — S96912A Strain of unspecified muscle and tendon at ankle and foot level, left foot, initial encounter: Secondary | ICD-10-CM | POA: Diagnosis not present

## 2021-05-25 DIAGNOSIS — G8929 Other chronic pain: Secondary | ICD-10-CM | POA: Diagnosis not present

## 2021-05-25 NOTE — Patient Instructions (Signed)
[facility] An [employer] is a type of bandage (dressing) for the foot and leg. The dressing is a gauze wrap that is soaked with a type of medicine called zinc oxide. The gauze may also include other lotions and medicines that help in wound healing, such as calamine. An Unna boot may be used to treat: Open sores (ulcers) on the foot, heel, or leg. Swelling from disorders that affect the veins or lymphatic system (lymphedema). Skin conditions such as chronic inflammation caused by poor blood flow (stasis dermatitis). The dressing is applied by a health care provider. The gauze is wrapped around your lower extremity in several layers, usually starting at the toes and going upward to the knee. A dry outer wrap goes over the medicated wrap for support and compression.  Before applying the [employer], your health care provider will clean your leg and foot and may apply an antibiotic ointment. You may be asked to raise (elevate) your leg for a while to reduce swelling before the boot is applied. The boot will dry and harden after it is applied. The boot may need to be changed or replaced about twice a week. Follow these instructions at home: [place] as told by your health care provider. You may need to wear a slipper or shoe over the boot that is one or two sizes larger than normal. Check the skin around the boot every day. Tell your health care provider about any concerns. Do not stick anything inside the boot to scratch your skin. Doing that increases your risk of infection. Keep your [employer] clean and dry. Check every day for signs of infection. Check for: Redness, swelling, or pain in your foot or toes. Fluid or blood coming from the boot. Pus or a bad smell coming from the boot. Remove the boot and call your health care provider if you have signs of poor blood flow, such as: Your toes tingle or become numb. Your toes turn cold or turn blue or pale. Your toes are more  swollen or painful. You are unable to move your toes. Activity You may walk with the boot once it has dried. Ask your health care provider how much walking is safe for you. Avoid sitting for a long time without moving. Get up to take short walks as told by your health care provider. This is important to improve blood flow. Bathing Do not take baths, swim, or use a hot tub until your health care provider approves. Ask your health care provider if you may take showers. If your health care provider approves a bath or a shower, do not let the Unna boot get wet. If you take a shower, cover the boot with a watertight covering. If you take a bath, keep your leg with the boot out of the tub. General instructions Keep your leg elevated above the level of your heart while you are sitting or lying down. This will decrease swelling. Do not sit with your knee bent for long periods of time. Take over-the-counter and prescription medicines only as told by your health care provider. Do not use any products that contain nicotine or tobacco, such as cigarettes, e-cigarettes, and chewing tobacco. These can delay healing. If you need help quitting, ask your health care provider. Keep all follow-up visits as told by your health care provider. This is important. Contact a health care provider if: Your skin feels itchy inside the boot. You have a burning sensation, a  rash, or itchy, red, swollen areas of skin (hives) in the boot area. You have a fever or chills. You have any signs of infection, such as: New redness, swelling, or pain. More fluid or blood coming from the boot. Pus or a bad smell coming from the boot. You have increased numbness or pain in your foot or toes. You have any changes in skin color on your foot or toes, such as the skin turning blue or pale or developing patchy areas with spots. Your boot has been damaged or feels like it is no longer fitting properly. Summary An Lou[place]ta [place] boot is a type of  bandage (dressing) system for the foot and leg. The dressing is a gauze wrap that is soaked with a type of medicine (zinc oxide) to treat foot, heel, or leg ulcers, swelling from disorders that affect the veins or lymphatic system (lymphedema), and skin conditions caused by poor blood flow (stasis dermatitis). This dressing is applied by a health care provider. After it is applied, the boot will dry and harden. The boot may need to be changed or replaced about twice a week. Let your health care provider know if you have any signs of poor blood flow or infection. This information is not intended to replace advice given to you by your health care provider. Make sure you discuss any questions you have with your health care provider. Document Revised: 09/18/2018 Document Reviewed: 02/07/2018 Elsevier Patient Education  [place].

## 2021-05-26 NOTE — Progress Notes (Signed)
SITUATION Reason for Consult: Follow-up with functional foot orthoses Patient / Caregiver Report: Patient is rolling her ankles out and needs suport  OBJECTIVE DATA History / Diagnosis: Instability of left ankle joint  Change in Pathology: Patient feels the foot orthotics make her roll worse  ACTIONS PERFORMED Patient's equipment was checked for structural stability and fit. Changed heel angle to promote pronation rather than patient's described supination. Device(s) intact and fit is excellent. All questions answered and concerns addressed.  PLAN Follow-up as needed (PRN). Plan of care discussed with and agreed upon by patient / caregiver.
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
## 2021-06-01 ENCOUNTER — Other Ambulatory Visit: Payer: Self-pay | Admitting: Podiatry

## 2021-06-01 DIAGNOSIS — S96912A Strain of unspecified muscle and tendon at ankle and foot level, left foot, initial encounter: Secondary | ICD-10-CM

## 2021-06-01 DIAGNOSIS — M25372 Other instability, left ankle: Secondary | ICD-10-CM

## 2021-06-03 ENCOUNTER — Ambulatory Visit (INDEPENDENT_AMBULATORY_CARE_PROVIDER_SITE_OTHER): Payer: Medicare HMO | Admitting: Family Medicine

## 2021-06-03 ENCOUNTER — Encounter (INDEPENDENT_AMBULATORY_CARE_PROVIDER_SITE_OTHER): Payer: Self-pay | Admitting: Family Medicine

## 2021-06-03 ENCOUNTER — Other Ambulatory Visit: Payer: Self-pay

## 2021-06-03 VITALS — BP 112/69 | HR 63 | Temp 97.9°F | Ht 65.0 in | Wt 211.0 lb

## 2021-06-03 DIAGNOSIS — R632 Polyphagia: Secondary | ICD-10-CM | POA: Diagnosis not present

## 2021-06-03 DIAGNOSIS — M79672 Pain in left foot: Secondary | ICD-10-CM | POA: Diagnosis not present

## 2021-06-03 DIAGNOSIS — Z6837 Body mass index (BMI) 37.0-37.9, adult: Secondary | ICD-10-CM

## 2021-06-03 DIAGNOSIS — E559 Vitamin D deficiency, unspecified: Secondary | ICD-10-CM | POA: Diagnosis not present

## 2021-06-03 DIAGNOSIS — E66812 Obesity, class 2: Secondary | ICD-10-CM

## 2021-06-03 MED ORDER — VITAMIN D (ERGOCALCIFEROL) 1.25 MG (50000 UNIT) PO CAPS: 50000.0000 [IU] | ORAL_CAPSULE | ORAL | 0 refills | Status: DC
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
## 2021-06-16 ENCOUNTER — Telehealth: Payer: Self-pay | Admitting: Podiatry

## 2021-06-16 NOTE — Telephone Encounter (Signed)
Patient wants to get a [place] boot and a orthopedic boot for her right foot.  She said they have helped her left leg so much that she wants one for the right as well. Please advise .

## 2021-06-23 ENCOUNTER — Ambulatory Visit
Admission: RE | Admit: 2021-06-23 | Discharge: 2021-06-23 | Disposition: A | Discharge status: Home / Self Care | Payer: Medicare HMO | Source: Ambulatory Visit | Attending: Podiatry | Admitting: Podiatry

## 2021-06-23 ENCOUNTER — Other Ambulatory Visit: Payer: Self-pay

## 2021-06-23 DIAGNOSIS — S96912A Strain of unspecified muscle and tendon at ankle and foot level, left foot, initial encounter: Secondary | ICD-10-CM

## 2021-07-13 ENCOUNTER — Ambulatory Visit: Payer: Medicare HMO

## 2021-07-13 ENCOUNTER — Encounter: Payer: Self-pay | Admitting: Podiatry

## 2021-07-13 ENCOUNTER — Ambulatory Visit: Payer: Medicare HMO | Admitting: Podiatry

## 2021-07-13 ENCOUNTER — Other Ambulatory Visit: Payer: Self-pay

## 2021-07-13 DIAGNOSIS — M949 Disorder of cartilage, unspecified: Secondary | ICD-10-CM

## 2021-07-13 DIAGNOSIS — M779 Enthesopathy, unspecified: Secondary | ICD-10-CM

## 2021-07-13 DIAGNOSIS — M2041 Other hammer toe(s) (acquired), right foot: Secondary | ICD-10-CM

## 2021-07-13 DIAGNOSIS — M7672 Peroneal tendinitis, left leg: Secondary | ICD-10-CM | POA: Diagnosis not present

## 2021-07-13 DIAGNOSIS — M899 Disorder of bone, unspecified: Secondary | ICD-10-CM

## 2021-07-13 DIAGNOSIS — Q666 Other congenital valgus deformities of feet: Secondary | ICD-10-CM

## 2021-07-13 DIAGNOSIS — M21961 Unspecified acquired deformity of right lower leg: Secondary | ICD-10-CM

## 2021-07-13 NOTE — Progress Notes (Signed)
SITUATION Reason for Consult: Evaluation for Bilateral Custom Foot Orthoses Patient / Caregiver Report: Patient is ready for foot orthotics  OBJECTIVE DATA: Patient History / Diagnosis:    ICD-10-CM   1. 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 Hammertoe of right foot  M20.41     3. Capsulitis  M77.9       Current or Previous Devices: None and no history  Foot Examination: Skin presentation:   Intact Ulcers & Callousing:   None and no history Toe / Foot Deformities:  Hammertoes Weight Bearing Presentation:  Planus Sensation:    Intact  ORTHOTIC RECOMMENDATION Recommended Device: 1x pair of custom functional foot orthotics  GOALS OF ORTHOSES - Reduce Pain - Prevent Foot Deformity - Prevent Progression of Further Foot Deformity - Relieve Pressure - Improve the Overall Biomechanical Function of the Foot and Lower Extremity.  ACTIONS PERFORMED Patient was casted for Foot Orthoses via crush box. Procedure was explained and patient tolerated procedure well. All questions were answered and concerns addressed.  PLAN Potential out of pocket cost was communicated to patient. Casts are to be sent to [facility] for fabrication. Patient is to be called for fitting when devices are ready.

## 2021-07-15 ENCOUNTER — Telehealth: Payer: Self-pay | Admitting: Podiatry

## 2021-07-15 NOTE — Telephone Encounter (Signed)
Received HTA auth # [id number] for orthotics ([id number] X2) valid 2.7.2023 thru 5.6.2023
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
## 2021-07-26 ENCOUNTER — Ambulatory Visit (INDEPENDENT_AMBULATORY_CARE_PROVIDER_SITE_OTHER): Payer: Medicare HMO | Admitting: Family Medicine

## 2021-08-03 ENCOUNTER — Ambulatory Visit (INDEPENDENT_AMBULATORY_CARE_PROVIDER_SITE_OTHER): Payer: Self-pay | Admitting: Family Medicine

## 2021-08-19 ENCOUNTER — Other Ambulatory Visit: Payer: Self-pay

## 2021-08-24 ENCOUNTER — Telehealth: Payer: Self-pay | Admitting: Cardiology

## 2021-08-24 NOTE — Telephone Encounter (Signed)
Pt c/o Shortness Of Breath: STAT if SOB developed within the last 24 hours or pt is noticeably SOB on the phone ? ?1. Are you currently SOB (can you hear that pt is SOB on the phone)? no ? ?2. How long have you been experiencing SOB? A month ? ?3. Are you SOB when sitting or when up moving around? When going up steps ? ?4. Are you currently experiencing any other symptoms? No ? ? ?Patient states she noticed at work she has been getting SOB when going up stairs. ?

## 2021-08-24 NOTE — Telephone Encounter (Signed)
Patient reports that over the past month, she has noticed sob with exertion. Denies chest pain/tightness/pressure. BP 124/70, P 60s. She gets sob when climbing stairs or walking dos 2 blocks. She does not get sob when lying in bed. She has not gained any weight and clothes do not fit tighter. She takes medications as prescribed. Please advise. ?

## 2021-08-25 ENCOUNTER — Other Ambulatory Visit: Payer: Self-pay

## 2021-08-25 ENCOUNTER — Ambulatory Visit (INDEPENDENT_AMBULATORY_CARE_PROVIDER_SITE_OTHER): Payer: PPO

## 2021-08-25 DIAGNOSIS — M7672 Peroneal tendinitis, left leg: Secondary | ICD-10-CM

## 2021-08-25 DIAGNOSIS — M2041 Other hammer toe(s) (acquired), right foot: Secondary | ICD-10-CM | POA: Diagnosis not present

## 2021-08-25 DIAGNOSIS — Q666 Other congenital valgus deformities of feet: Secondary | ICD-10-CM

## 2021-08-25 DIAGNOSIS — M779 Enthesopathy, unspecified: Secondary | ICD-10-CM | POA: Diagnosis not present

## 2021-08-25 DIAGNOSIS — M21961 Unspecified acquired deformity of right lower leg: Secondary | ICD-10-CM | POA: Diagnosis not present

## 2021-08-25 NOTE — Progress Notes (Signed)
SITUATION: ?Reason for Visit: Fitting and Delivery of Custom Fabricated Foot Orthoses ?Patient Report: Patient reports comfort and is satisfied with device. ? ?OBJECTIVE DATA: ?Patient History / Diagnosis:   ?  ICD-10-CM   ?1. 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  ?2. Hammertoe of right foot  M20.41   ?  ?3. Capsulitis  M77.9   ?  ?4. Peroneal tendinitis of left lower extremity  M76.72   ?  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  ? ? ?Provided Device:  Custom Functional Foot Orthotics ?    [place]: [id number] ? ?GOAL OF ORTHOSIS ?- Improve gait ?- Decrease energy expenditure ?- Improve Balance ?- Provide Triplanar stability of foot complex ?- Facilitate motion ? ?ACTIONS PERFORMED ?Patient was fit with foot orthotics trimmed to shoe last. Patient tolerated fittign procedure.  ? ?Patient was provided with verbal and written instruction and demonstration regarding donning, doffing, wear, care, proper fit, function, purpose, cleaning, and use of the orthosis and in all related precautions and risks and benefits regarding the orthosis. ? ?Patient was also provided with verbal instruction regarding how to report any failures or malfunctions of the orthosis and necessary follow up care. Patient was also instructed to contact our office regarding any change in status that may affect the function of the orthosis. ? ?Patient demonstrated independence with proper donning, doffing, and fit and verbalized understanding of all instructions. ? ?PLAN: ?Patient is to follow up in one week or as necessary (PRN). All questions were answered and concerns addressed. Plan of care was discussed with and agreed upon by the patient. ? ?

## 2021-08-25 NOTE — Telephone Encounter (Signed)
Spoke with pt, Aware of dr crenshaw's recommendations.  Follow up scheduled  

## 2021-08-30 ENCOUNTER — Ambulatory Visit (INDEPENDENT_AMBULATORY_CARE_PROVIDER_SITE_OTHER): Payer: PPO | Admitting: Nurse Practitioner

## 2021-08-30 ENCOUNTER — Other Ambulatory Visit: Payer: Self-pay

## 2021-08-30 ENCOUNTER — Encounter: Payer: Self-pay | Admitting: Nurse Practitioner

## 2021-08-30 VITALS — BP 130/70 | HR 65 | Ht 64.0 in | Wt 224.6 lb

## 2021-08-30 DIAGNOSIS — R0609 Other forms of dyspnea: Secondary | ICD-10-CM

## 2021-08-30 DIAGNOSIS — E782 Mixed hyperlipidemia: Secondary | ICD-10-CM

## 2021-08-30 DIAGNOSIS — I10 Essential (primary) hypertension: Secondary | ICD-10-CM

## 2021-08-30 DIAGNOSIS — I872 Venous insufficiency (chronic) (peripheral): Secondary | ICD-10-CM

## 2021-08-30 DIAGNOSIS — I5189 Other ill-defined heart diseases: Secondary | ICD-10-CM

## 2021-08-30 DIAGNOSIS — Z79899 Other long term (current) drug therapy: Secondary | ICD-10-CM

## 2021-08-30 DIAGNOSIS — E7800 Pure hypercholesterolemia, unspecified: Secondary | ICD-10-CM

## 2021-08-30 MED ORDER — METOPROLOL TARTRATE 50 MG PO TABS: ORAL_TABLET | ORAL | 0 refills | Status: AC

## 2021-08-30 NOTE — Patient Instructions (Signed)
Medication Instructions:  ?Metoprolol 50 mg one tablet 90-120 mins before procedure ? ?*If you need a refill on your cardiac medications before your next appointment, please call your pharmacy* ? ? ?Lab Work: ?Your physician recommends that you return for lab work 1 week prior to scan ?BMET ?If you have labs (blood work) drawn today and your tests are completely normal, you will receive your results only by: ?MyChart Message (if you have MyChart) OR ?A paper copy in the mail ?If you have any lab test that is abnormal or we need to change your treatment, we will call you to review the results. ? ? ?Testing/Procedures: ?Your physician has requested that you have an echocardiogram. Echocardiography is a painless test that uses sound waves to create images of your heart. It provides your doctor with information about the size and shape of your heart and how well your heart?s chambers and valves are working. This procedure takes approximately one hour. There are no restrictions for this procedure.  ? ? ? ?Your cardiac CT will be scheduled at one of the below locations:  ? ?[facility] ?[address] ?[place], [place] [address] ?[phone] ? ?OR ? ?[place] ?[place] ?[address] ?[place],  [address] ?[phone] ? ?If scheduled at [facility], please arrive at the [facility] and Children's Entrance (Entrance C2) of [facility] 30 minutes prior to test start time. ?You can use the FREE valet parking offered at entrance C (encouraged to control the heart rate for the test)  ?Proceed to the [facility] Radiology Department (first floor) to check-in and test prep. ? ?All radiology patients and guests should use entrance C2 at [facility], accessed from [facility], even though the hospital's physical address listed is [address] ? ? ? ?If scheduled at [facility], please arrive 15 mins  early for check-in and test prep. ? ?Please follow these instructions carefully (unless otherwise directed): ? ?Hold all erectile dysfunction medications at least 3 days (72 hrs) prior to test. ? ?On the Night Before the Test: ?Be sure to Drink plenty of water. ?Do not consume any caffeinated/decaffeinated beverages or chocolate 12 hours prior to your test. ?Do not take any antihistamines 12 hours prior to your test. ?If the patient has contrast allergy: ?Patient will need a prescription for Prednisone and very clear instructions (as follows): ?Prednisone 50 mg - take 13 hours prior to test ?Take another Prednisone 50 mg 7 hours prior to test ?Take another Prednisone 50 mg 1 hour prior to test ?Take Benadryl 50 mg 1 hour prior to test ?Patient must complete all four doses of above prophylactic medications. ?Patient will need a ride after test due to Benadryl. ? ?On the Day of the Test: ?Drink plenty of water until 1 hour prior to the test. ?Do not eat any food 4 hours prior to the test. ?You may take your regular medications prior to the test.  ?Take metoprolol (Lopressor) two hours prior to test. ?HOLD Furosemide/Hydrochlorothiazide morning of the test. ?FEMALES- please wear underwire-free bra if available, avoid dresses & tight clothing ? ? ?*For Clinical Staff only. Please instruct patient the following:* ?Heart Rate Medication Recommendations for Cardiac CT  ?Resting HR < 50 bpm  ?No medication  ?Resting HR 50-60 bpm and BP >110/50 mmHG   ?Consider Metoprolol tartrate 25 mg PO 90-120 min prior to scan  ?Resting HR 60-65 bpm and BP >110/50 mmHG  ?  Metoprolol tartrate 50 mg PO 90-120 minutes prior to scan   ?Resting HR > 65 bpm and BP >110/50 mmHG  ?Metoprolol tartrate 100 mg PO 90-120 minutes prior to scan  ?Consider Ivabradine 10-15 mg PO or a calcium channel blocker for resting HR >60 bpm and contraindication to metoprolol tartrate  ?Consider Ivabradine 10-15 mg PO in combination with metoprolol tartrate for HR  >80 bpm   ? ?     ?After the Test: ?Drink plenty of water. ?After receiving IV contrast, you may experience a mild flushed feeling. This is normal. ?On occasion, you may experience a mild rash up to 24 hours after the test. This is not dangerous. If this occurs, you can take Benadryl 25 mg and increase your fluid intake. ?If you experience trouble breathing, this can be serious. If it is severe call 911 IMMEDIATELY. If it is mild, please call our office. ?If you take any of these medications: Glipizide/Metformin, Avandament, Glucavance, please do not take 48 hours after completing test unless otherwise instructed. ? ?We will call to schedule your test 2-4 weeks out understanding that some insurance companies will need an authorization prior to the service being performed.  ? ?For non-scheduling related questions, please contact the cardiac imaging nurse navigator should you have any questions/concerns: ?Marchia Bond, Cardiac Imaging Nurse Navigator ?Gordy Clement, Cardiac Imaging Nurse Navigator ?[facility] ?Direct Office Dial: [phone]  ? ?For scheduling needs, including cancellations and rescheduling, please call [place], [phone].  ? ?Follow-Up: ?At [facility], you and your health needs are our priority.  As part of our continuing mission to provide you with exceptional heart care, we have created designated Provider Care Teams.  These Care Teams include your primary Cardiologist (physician) and Advanced Practice Providers (APPs -  Physician Assistants and Nurse Practitioners) who all work together to provide you with the care you need, when you need it. ? ?We recommend signing up for the patient portal called "MyChart".  Sign up information is provided on this After Visit Summary.  MyChart is used to connect with patients for Virtual Visits (Telemedicine).  Patients are able to view lab/test results, encounter notes, upcoming appointments, etc.  Non-urgent messages can  be sent to your provider as well.   ?To learn more about what you can do with MyChart, go to [email or web address].   ? ?Your next appointment:   ?6-8 week(s) ? ?The format for your next appointment:   ?In Person ? ?Provider:   ?Kirk Ruths, MD  or Diona Browner, NP      ? ? ? ? ? ?

## 2021-08-30 NOTE — Progress Notes (Signed)
? ? ?Office Visit  ?  ?Patient Name: Erin Vazquez ?Date of Encounter: 08/30/2021 ? ?Primary Care Provider:  Nolene Ebbs, MD ?Primary Cardiologist:  Kirk Ruths, MD ? ?Chief Complaint  ?  ?81 year old female with a history of hypertension, aortic atherosclerosis on CT, venous insufficiency, osteoarthritis, and hypothyroidism who presents for follow-up related to dyspnea on exertion. ? ?Past Medical History  ?  ?Past Medical History:  ?Diagnosis Date  ? Adrenal nodule ([place])   ? Arthritis   ? Back pain   ? Bunion, left foot   ? Eczema   ? Fibrocystic breast   ? Left  ? Fibroids, endometrial and uterine   ? Gallstones   ? Genital herpes   ? Hammer toe of left foot   ? Heart valve problem   ? History of cardiomegaly   ? History of colonic diverticulitis   ? History of iron deficiency anemia   ? HTN (hypertension)   ? Hypothyroid   ? Joint pain   ? Lower extremity edema   ? Mild calcific aortic stenosis   ? Osteoarthritis   ? Ovarian cyst   ? pt unaware  ? Pre-diabetes   ? Sigmoid diverticulosis   ? Venous insufficiency   ? ?Past Surgical History:  ?Procedure Laterality Date  ? BREAST RECONSTRUCTION  1976  ? BREAST SURGERY Left 1961  ? Removed 3 fibroadenoma   ? CERVICAL CONE BIOPSY  1981  ? KNEE ARTHROSCOPY Left 2010 Dr. Aline Brochure  ? MASTECTOMY, PARTIAL Left 1979  ? [place]  ? OPEN REDUCTION INTERNAL FIXATION (ORIF) DISTAL RADIAL FRACTURE Left 04/24/2018  ? Procedure: OPEN REDUCTION INTERNAL FIXATION (ORIF) LEFT DISTAL RADIAL FRACTURE;  Surgeon: Renette Butters, MD;  Location: [place];  Service: Orthopedics;  Laterality: Left;  ? SHOULDER SURGERY Right 1976, 11/2017  ? lipoma excision  ? TUBAL LIGATION    ? venaseal    ? ? ?Allergies ? ?No Known Allergies ? ?History of Present Illness  ?  ?81 year old female with the above past medical history including hypertension, hyperlipidemia, aortic atherosclerosis on CT, venous insufficiency, osteoarthritis, and hypothyroidism. ? ?Echocardiogram  in 2020 showed normal LV function, G1 DD, mild LAE.  Aortic atherosclerosis noted on previous abdominal CT.  She was last seen in the office on 10/09/2020 and was stable overall from a cardiac standpoint. BP was somewhat elevated. She called the office on 08/24/2021 and reported a 1 month history of dyspnea on exertion.  She was advised to schedule an appointment for further evaluation.  She presents today for follow-up.  Since she called our office she has been stable overall from a cardiac standpoint, though she does report ongoing dyspnea on exertion, particularly when climbing stairs.  She works full-time as a Product manager care and has noticed that when climbing stairs in patient's home she is more short of breath.  She denies chest pain, palpitations, worsening edema, PND, orthopnea.  She is generally active and is eager to begin increasing her activity with regular exercise, however, she is hesitant to do so given her worsening dyspnea on exertion she is concerned this could be a sign of a problem with her heart. ? ?Home Medications  ?  ?Current Outpatient Medications  ?Medication Sig Dispense Refill  ? ferrous sulfate 325 (65 FE) MG tablet Take 325 mg by mouth daily with breakfast.    ? Garlic 505 MG TABS Take 1 tablet by mouth daily.    ? levothyroxine (SYNTHROID, LEVOTHROID) 75 MCG  tablet Take 75 mcg by mouth daily.    ? losartan-hydrochlorothiazide (HYZAAR) 50-12.5 MG per tablet Take 2 tablets by mouth daily.     ? MAGNESIUM PO Take 1 tablet by mouth daily.    ? metoprolol tartrate (LOPRESSOR) 50 MG tablet Take 1 tablet 2 hour prior to scan 1 tablet 0  ? Multiple Vitamin (MULTIVITAMIN) tablet Take 1 tablet by mouth daily.    ? naproxen (NAPROSYN) 500 MG tablet Take 500 mg by mouth 2 (two) times daily as needed.    ? Omega-3 Fatty Acids (FISH OIL PO) Take 1 tablet by mouth daily.    ? OVER THE COUNTER MEDICATION Take 1 tablet by mouth daily. Med Name: Zyflamend    ? oxyCODONE-acetaminophen  (PERCOCET) 5-325 MG tablet Take 1 tablet by mouth every 6 (six) hours as needed. 20 tablet 0  ? traMADol (ULTRAM) 50 MG tablet Take 50 mg by mouth 2 (two) times daily as needed.    ? UNABLE TO FIND Take 1 tablet by mouth daily. Med Name: Vital Reds    ? UNABLE TO FIND Take 1 tablet by mouth daily. Med Name: Total Restore    ? UNABLE TO FIND Bio complete 3-daily    ? valACYclovir (VALTREX) 1000 MG tablet Take 1,000 mg by mouth 2 (two) times daily.    ? Vitamin D, Ergocalciferol, (DRISDOL) 1.25 MG (50000 UNIT) CAPS capsule Take 1 capsule (50,000 Units total) by mouth every 7 (seven) days. 4 capsule 0  ? ?No current facility-administered medications for this visit.  ?  ? ?Review of Systems  ?  ?She denies chest pain, palpitations, dyspnea, pnd, orthopnea, n, v, dizziness, syncope, edema, weight gain, or early satiety. All other systems reviewed and are otherwise negative except as noted above.  ? ?Physical Exam  ?  ?VS:  BP 130/70 (BP Location: Left Arm, Patient Position: Sitting, Cuff Size: Large)   Pulse 65   Ht '5\' 4"'$  (1.626 m)   Wt 224 lb 9.6 oz (101.9 kg)   SpO2 99%   BMI 38.55 kg/m?   ?GEN: Well nourished, well developed, in no acute distress. ?HEENT: normal. ?Neck: Supple, no JVD, carotid bruits, or masses. ?Cardiac: RRR, no murmurs, rubs, or gallops. No clubbing, cyanosis, nonpitting bilateral lower extremity edema.  Radials/DP/PT 2+ and equal bilaterally.  ?Respiratory:  Respirations regular and unlabored, clear to auscultation bilaterally. ?GI: Soft, nontender, nondistended, BS + x 4. ?MS: no deformity or atrophy. ?Skin: warm and dry, no rash. ?Neuro:  Strength and sensation are intact. ?Psych: Normal affect. ? ?Accessory Clinical Findings  ?  ?ECG personally reviewed by me today - NSR, 65 bpm, nonspecific ST/T wave changes - no acute changes. ? ?Lab Results  ?Component Value Date  ? WBC 6.0 02/18/2021  ? HGB 11.8 02/18/2021  ? HCT 38.6 02/18/2021  ? MCV 73.7 (L) 02/18/2021  ? PLT 242 02/18/2021  ? ?Lab  Results  ?Component Value Date  ? CREATININE 0.78 02/18/2021  ? BUN 18 02/18/2021  ? NA 137 02/18/2021  ? K 4.1 02/18/2021  ? CL 105 02/18/2021  ? CO2 21 02/18/2021  ? ?Lab Results  ?Component Value Date  ? ALT 14 02/18/2021  ? AST 26 02/18/2021  ? ALKPHOS 83 10/09/2020  ? BILITOT 0.9 02/18/2021  ? ?Lab Results  ?Component Value Date  ? CHOL 178 02/18/2021  ? HDL 47 (L) 02/18/2021  ? LDLCALC 114 (H) 02/18/2021  ? TRIG 76 02/18/2021  ? CHOLHDL 3.8 02/18/2021  ?  ?No  results found for: HGBA1C ? ?Assessment & Plan  ?  ?1. Dyspnea on exertion/grade 1 diastolic dysfunction: Echo in 2020 showed normal LV function, G1 DD, mild LAE. She reports of 1 month history of dyspnea on exertion only noticeable when climbing stairs. She denies chest pain. Chronic bilateral lower extremity edema in the setting of chronic venous insuffiencey. She denies worsening edema, pnd, orthopnea. Weight is up 13 pounds in the last 3 months, likely in the setting of increased caloric intake and inactivity. Euvolemic and well compensated on exam. Given her symptoms occur only with exertion, and presence of aortic atherosclerosis on prior CT abdomen/pelvis, risk factors for CAD, and through shared decision making, will proceed with coronary CTA for further ischemic evaluation. BMET 1 week prior to CT. Additionally, will repeat echocardiogram given history of diastolic dysfunction on prior echo.  ? ?2. Hypertension: BP well controlled. Continue current antihypertensive regimen.  ? ?3. Hyperlipidemia: LDL was 114 in September 2022. Pending coronary CTA results, and given active lifestyle, consider addition of lipid-lowering medication in the future. Encouraged ongoing lifestyle modifications with diet and exercise. ? ?4. Chronic venous insufficiency: Chronic bilateral pedal edema. Denies worsening edema in the setting of increased dyspnea on exertion.  Stable.  Encouraged elevation, compression. ? ?5. Disposition: F/u in 6-8 weeks.  ? ?Lenna Sciara,  NP ?08/30/2021, 4:29 PM ?  ?

## 2021-09-02 ENCOUNTER — Ambulatory Visit (INDEPENDENT_AMBULATORY_CARE_PROVIDER_SITE_OTHER): Payer: Self-pay | Admitting: Family Medicine

## 2021-09-06 ENCOUNTER — Ambulatory Visit (HOSPITAL_COMMUNITY): Payer: PPO | Attending: Cardiology

## 2021-09-06 ENCOUNTER — Other Ambulatory Visit: Payer: Self-pay

## 2021-09-06 DIAGNOSIS — R0609 Other forms of dyspnea: Secondary | ICD-10-CM | POA: Insufficient documentation

## 2021-09-06 LAB — ECHOCARDIOGRAM COMPLETE
Area-P 1/2: 2.04 cm2
S' Lateral: 2.5 cm

## 2021-09-07 ENCOUNTER — Telehealth: Payer: Self-pay

## 2021-09-07 NOTE — Telephone Encounter (Signed)
Spoke with pt. Pt was notified of Echo results and pt will follow up as directed.  ?

## 2021-09-09 ENCOUNTER — Telehealth (HOSPITAL_COMMUNITY): Payer: Self-pay | Admitting: Emergency Medicine

## 2021-09-09 NOTE — Telephone Encounter (Signed)
Attempted to call patient regarding upcoming cardiac CT appointment. Left message on voicemail with name and callback number Charron Coultas RN Navigator Cardiac Imaging [facility] [phone] Office [phone] Cell  Reminder for labs 

## 2021-09-10 ENCOUNTER — Telehealth (HOSPITAL_COMMUNITY): Payer: Self-pay | Admitting: *Deleted

## 2021-09-10 NOTE — Telephone Encounter (Signed)
Reaching out to patient to offer assistance regarding upcoming cardiac imaging study; pt verbalizes understanding of appt date/time, parking situation and where to check in, pre-test NPO status and medications ordered, and verified current allergies; name and call back number provided for further questions should they arise ? ?Gordy Clement RN Navigator Cardiac Imaging ?[facility] ?[phone] office ?[phone] cell ? ?Patient to take '50mg'$  metoprolol tartrate two hours prior to her cardiac CT scan.  She is aware to arrive at 2:45pm for her 3:15pm scan. ?

## 2021-09-11 LAB — BASIC METABOLIC PANEL
BUN/Creatinine Ratio: 15 (ref 12–28)
BUN: 14 mg/dL (ref 8–27)
CO2: 26 mmol/L (ref 20–29)
Calcium: 9.1 mg/dL (ref 8.7–10.3)
Chloride: 107 mmol/L — ABNORMAL HIGH (ref 96–106)
Creatinine, Ser: 0.93 mg/dL (ref 0.57–1.00)
Glucose: 89 mg/dL (ref 70–99)
Potassium: 4.7 mmol/L (ref 3.5–5.2)
Sodium: 145 mmol/L — ABNORMAL HIGH (ref 134–144)
eGFR: 62 mL/min/{1.73_m2} (ref >59)

## 2021-09-13 ENCOUNTER — Ambulatory Visit (HOSPITAL_COMMUNITY)
Admission: RE | Admit: 2021-09-13 | Discharge: 2021-09-13 | Disposition: A | Discharge status: Home / Self Care | Payer: PPO | Source: Ambulatory Visit | Attending: Nurse Practitioner | Admitting: Nurse Practitioner

## 2021-09-13 ENCOUNTER — Telehealth: Payer: Self-pay

## 2021-09-13 ENCOUNTER — Encounter (HOSPITAL_COMMUNITY): Payer: Self-pay

## 2021-09-13 DIAGNOSIS — R0609 Other forms of dyspnea: Secondary | ICD-10-CM | POA: Diagnosis present

## 2021-09-13 DIAGNOSIS — I10 Essential (primary) hypertension: Secondary | ICD-10-CM | POA: Insufficient documentation

## 2021-09-13 DIAGNOSIS — I700 Atherosclerosis of aorta: Secondary | ICD-10-CM | POA: Insufficient documentation

## 2021-09-13 MED ORDER — NITROGLYCERIN 0.4 MG SL SUBL
SUBLINGUAL_TABLET | SUBLINGUAL | Status: AC
  Filled 2021-09-13: qty 2

## 2021-09-13 MED ORDER — IOHEXOL 350 MG/ML SOLN
100.0000 mL | Freq: Once | INTRAVENOUS | Status: AC | PRN
  Administered 2021-09-13: 100 mL via INTRAVENOUS

## 2021-09-13 MED ORDER — NITROGLYCERIN 0.4 MG SL SUBL
0.8000 mg | SUBLINGUAL_TABLET | Freq: Once | SUBLINGUAL | Status: AC
Start: 2021-09-13 — End: 2021-09-13
  Administered 2021-09-13: 0.8 mg via SUBLINGUAL

## 2021-09-13 NOTE — Telephone Encounter (Signed)
Spoke with pt. She was notified of lab results and is have coronary CTA today.  ?

## 2021-09-16 ENCOUNTER — Ambulatory Visit (INDEPENDENT_AMBULATORY_CARE_PROVIDER_SITE_OTHER): Payer: Self-pay | Admitting: Family Medicine

## 2021-09-16 ENCOUNTER — Telehealth: Payer: Self-pay

## 2021-09-16 NOTE — Telephone Encounter (Signed)
Spoke with pt. Pt was notified of results and recommendations. Pt asked if there is anything that she can do to improve the calcification of the aortic valve and aortic root. Per Diona Browner, NP, pt should follow a heart healthy diet, exercise as tolerated and at her next office visit, a cholesterol medication can be discussed. Per Diona Browner, NP, a cholesterol medication may not be needed. Pt is aware and will discuss further at her next office visit.  ?

## 2021-09-23 ENCOUNTER — Encounter (INDEPENDENT_AMBULATORY_CARE_PROVIDER_SITE_OTHER): Payer: Self-pay | Admitting: Family Medicine

## 2021-09-23 ENCOUNTER — Telehealth (INDEPENDENT_AMBULATORY_CARE_PROVIDER_SITE_OTHER): Payer: PPO | Admitting: Family Medicine

## 2021-09-23 DIAGNOSIS — E559 Vitamin D deficiency, unspecified: Secondary | ICD-10-CM | POA: Diagnosis not present

## 2021-09-23 DIAGNOSIS — E669 Obesity, unspecified: Secondary | ICD-10-CM | POA: Diagnosis not present

## 2021-09-23 DIAGNOSIS — M79672 Pain in left foot: Secondary | ICD-10-CM | POA: Diagnosis not present

## 2021-09-23 DIAGNOSIS — R7303 Prediabetes: Secondary | ICD-10-CM

## 2021-09-23 DIAGNOSIS — Z6835 Body mass index (BMI) 35.0-35.9, adult: Secondary | ICD-10-CM

## 2021-09-23 MED ORDER — OZEMPIC (0.25 OR 0.5 MG/DOSE) 2 MG/1.5ML ~~LOC~~ SOPN: 0.5000 mg | PEN_INJECTOR | SUBCUTANEOUS | 0 refills | Status: AC

## 2021-09-23 MED ORDER — VITAMIN D (ERGOCALCIFEROL) 1.25 MG (50000 UNIT) PO CAPS: 50000.0000 [IU] | ORAL_CAPSULE | ORAL | 0 refills | Status: AC

## 2021-09-23 NOTE — Progress Notes (Signed)
TeleHealth Visit:  Due to the COVID-19 pandemic, this visit was completed with telemedicine (audio/video) technology to reduce patient and provider exposure as well as to preserve personal protective equipment.   Erin Vazquez has verbally consented to this TeleHealth visit. The patient is located at home, the provider is located at home. The participants in this visit include the listed provider and patient. The visit was conducted today via MyChart video.  OBESITY Erin Vazquez is here to discuss her progress with her obesity treatment plan along with follow-up of her obesity related diagnoses.   Today's visit was #: 15 Starting weight: 224 lbs Starting date: 12/26/2019 Today's date: 09/23/2021 Weight at home: 215 lbs (up 4 lbs)  Nutrition Plan: the [place] for 85% of the time.  Activity: None at this time  Assessment/Plan:   Diagnoses and all orders for this visit:  Vitamin D deficiency -     Vitamin D, Ergocalciferol, (DRISDOL) 1.25 MG (50000 UNIT) CAPS capsule; Take 1 capsule (50,000 Units total) by mouth every 7 (seven) days.  Left foot pain Followed by Orthopedics. This issue directly impacts care plan for optimization of BMI and metabolic health as it impacts the patient's ability to make lifestyle changes. We will continue to monitor symptoms as they relate to her weight loss journey.  Prediabetes During insulin resistance, several metabolic alterations induce the development of cardiovascular disease. For instance, insulin resistance can induce an imbalance in glucose metabolism that generates chronic hyperglycemia, which in turn triggers oxidative stress and causes an inflammatory response that leads to cell damage. Insulin resistance can also alter systemic lipid metabolism which then leads to the development of dyslipidemia and the well-known lipid triad: (1) high levels of plasma triglycerides, (2) low levels of high-density lipoprotein, and (3) the appearance of small dense  low-density lipoproteins. This triad, along with endothelial dysfunction, which can also be induced by aberrant insulin signaling, contribute to atherosclerotic plaque formation.   Plan: We will see if Ozempic PA goes through.   Obesity, current BMI 35.2  Erin Vazquez is currently in the action stage of change. As such, her goal is to continue with weight loss efforts. She has agreed to the [employer].   Exercise goals: As is.  Behavioral modification strategies: increasing lean protein intake, decreasing simple carbohydrates, increasing vegetables, and increasing water intake.  Erin Vazquez has agreed to follow-up with our clinic in 4 weeks. She was informed of the importance of frequent follow-up visits to maximize her success with intensive lifestyle modifications for her multiple health conditions.  Objective:   VITALS: Per patient if applicable, see vitals. GENERAL: Alert and in no acute distress. CARDIOPULMONARY: No increased WOB. Speaking in clear sentences.  PSYCH: Pleasant and cooperative. Speech normal rate and rhythm. Affect is appropriate. Insight and judgement are appropriate. Attention is focused, linear, and appropriate.  NEURO: Oriented as arrived to appointment on time with no prompting.   Lab Results  Component Value Date   CREATININE 0.93 09/10/2021   BUN 14 09/10/2021   NA 145 (H) 09/10/2021   K 4.7 09/10/2021   CL 107 (H) 09/10/2021   CO2 26 09/10/2021   Lab Results  Component Value Date   ALT 14 02/18/2021   AST 26 02/18/2021   ALKPHOS 83 10/09/2020   BILITOT 0.9 02/18/2021   Lab Results  Component Value Date   INSULIN 11.8 12/26/2019   Lab Results  Component Value Date   TSH 1.96 02/18/2021   Lab Results  Component Value Date  CHOL 178 02/18/2021   HDL 47 (L) 02/18/2021   LDLCALC 114 (H) 02/18/2021   TRIG 76 02/18/2021   CHOLHDL 3.8 02/18/2021   Lab Results  Component Value Date   WBC 6.0 02/18/2021   HGB 11.8 02/18/2021   HCT 38.6 02/18/2021    MCV 73.7 (L) 02/18/2021   PLT 242 02/18/2021   Lab Results  Component Value Date   IRON 90 03/31/2020   TIBC 299 03/31/2020   FERRITIN 187 (H) 03/31/2020   Attestation Statements:   Reviewed by clinician on day of visit: allergies, medications, problem list, medical history, surgical history, family history, social history, and previous encounter notes.

## 2021-10-04 ENCOUNTER — Telehealth (INDEPENDENT_AMBULATORY_CARE_PROVIDER_SITE_OTHER): Payer: Self-pay | Admitting: Family Medicine

## 2021-10-04 ENCOUNTER — Encounter (INDEPENDENT_AMBULATORY_CARE_PROVIDER_SITE_OTHER): Payer: Self-pay

## 2021-10-04 NOTE — Telephone Encounter (Signed)
The initial electronic request indicated that prior authorization was not required. After following up with your insurance company, prior authorization is required. Per your insurance company it can take up to 72  hours from today for our office to receive a response by fax. We will notify you once a response is received. Reference # [id number] for call. Patient sent message via mychart.  ?

## 2021-10-05 ENCOUNTER — Encounter (INDEPENDENT_AMBULATORY_CARE_PROVIDER_SITE_OTHER): Payer: Self-pay

## 2021-10-05 NOTE — Telephone Encounter (Signed)
Prior authorization denied for Ozempic. Per insurance: Your request was denied because it is being used for an indication which is NOT approved or medically-accepted: Prediabetes. Patient sent denial message via mychart.  ?

## 2021-10-11 ENCOUNTER — Encounter: Payer: Self-pay | Admitting: Nurse Practitioner

## 2021-10-11 ENCOUNTER — Ambulatory Visit (INDEPENDENT_AMBULATORY_CARE_PROVIDER_SITE_OTHER): Payer: PPO | Admitting: Nurse Practitioner

## 2021-10-11 VITALS — BP 132/76 | HR 73 | Ht 64.0 in | Wt 222.8 lb

## 2021-10-11 DIAGNOSIS — R0609 Other forms of dyspnea: Secondary | ICD-10-CM

## 2021-10-11 DIAGNOSIS — I10 Essential (primary) hypertension: Secondary | ICD-10-CM

## 2021-10-11 DIAGNOSIS — E782 Mixed hyperlipidemia: Secondary | ICD-10-CM | POA: Diagnosis not present

## 2021-10-11 DIAGNOSIS — I5189 Other ill-defined heart diseases: Secondary | ICD-10-CM

## 2021-10-11 DIAGNOSIS — I872 Venous insufficiency (chronic) (peripheral): Secondary | ICD-10-CM

## 2021-10-11 DIAGNOSIS — Z6838 Body mass index (BMI) 38.0-38.9, adult: Secondary | ICD-10-CM

## 2021-10-11 DIAGNOSIS — E669 Obesity, unspecified: Secondary | ICD-10-CM

## 2021-10-11 NOTE — Patient Instructions (Signed)
Medication Instructions:  Your physician recommends that you continue on your current medications as directed. Please refer to the Current Medication list given to you today.   *If you need a refill on your cardiac medications before your next appointment, please call your pharmacy*   Lab Work: NONE ordered at this time of appointment   If you have labs (blood work) drawn today and your tests are completely normal, you will receive your results only by: MyChart Message (if you have MyChart) OR A paper copy in the mail If you have any lab test that is abnormal or we need to change your treatment, we will call you to review the results.   Testing/Procedures: NONE ordered at this time of appointment     Follow-Up: At [facility], you and your health needs are our priority.  As part of our continuing mission to provide you with exceptional heart care, we have created designated Provider Care Teams.  These Care Teams include your primary Cardiologist (physician) and Advanced Practice Providers (APPs -  Physician Assistants and Nurse Practitioners) who all work together to provide you with the care you need, when you need it.  We recommend signing up for the patient portal called "MyChart".  Sign up information is provided on this After Visit Summary.  MyChart is used to connect with patients for Virtual Visits (Telemedicine).  Patients are able to view lab/test results, encounter notes, upcoming appointments, etc.  Non-urgent messages can be sent to your provider as well.   To learn more about what you can do with MyChart, go to [email or web address].    Your next appointment:   1 year(s)  The format for your next appointment:   In Person  Provider:   Brian Crenshaw, MD     Other Instructions   Important Information About Sugar       

## 2021-10-11 NOTE — Progress Notes (Signed)
? ? ?Office Visit  ?  ?Patient Name: Erin Vazquez ?Date of Encounter: 10/11/2021 ? ?Primary Care Provider:  Nolene Ebbs, MD ?Primary Cardiologist:  Kirk Ruths, MD ? ?Chief Complaint  ?  ?81 year old female with a history of hypertension, aortic atherosclerosis on CT, chronic venous insufficiency, osteoarthritis, and hypothyroidism who presents for follow-up related to dyspnea on exertion. ?  ?Past Medical History  ?  ?Past Medical History:  ?Diagnosis Date  ? Adrenal nodule ([place])   ? Arthritis   ? Back pain   ? Bunion, left foot   ? Eczema   ? Fibrocystic breast   ? Left  ? Fibroids, endometrial and uterine   ? Gallstones   ? Genital herpes   ? Hammer toe of left foot   ? Heart valve problem   ? History of cardiomegaly   ? History of colonic diverticulitis   ? History of iron deficiency anemia   ? HTN (hypertension)   ? Hypothyroid   ? Joint pain   ? Lower extremity edema   ? Mild calcific aortic stenosis   ? Osteoarthritis   ? Ovarian cyst   ? pt unaware  ? Pre-diabetes   ? Sigmoid diverticulosis   ? Venous insufficiency   ? ?Past Surgical History:  ?Procedure Laterality Date  ? BREAST RECONSTRUCTION  1976  ? BREAST SURGERY Left 1961  ? Removed 3 fibroadenoma   ? CERVICAL CONE BIOPSY  1981  ? KNEE ARTHROSCOPY Left 2010 Dr. Aline Brochure  ? MASTECTOMY, PARTIAL Left 1979  ? Sub[place]  ? OPEN REDUCTION INTERNAL FIXATION (ORIF) DISTAL RADIAL FRACTURE Left 04/24/2018  ? Procedure: OPEN REDUCTION INTERNAL FIXATION (ORIF) LEFT DISTAL RADIAL FRACTURE;  Surgeon: Renette Butters, MD;  Location: [facility];  Service: Orthopedics;  Laterality: Left;  ? SHOULDER SURGERY Right 1976, 11/2017  ? lipoma excision  ? TUBAL LIGATION    ? venaseal    ? ? ?Allergies ? ?No Known Allergies ? ?History of Present Illness  ?  ?81 year old female with the above past medical history including hypertension, hyperlipidemia, aortic atherosclerosis on CT, chronic venous insufficiency, osteoarthritis, and hypothyroidism. ?   ?Echocardiogram in 2020 showed normal LV function, G1 DD, mild LAE.  Aortic atherosclerosis noted on previous abdominal CT.  She called the office on 08/24/2021 and reported a 1 month history of dyspnea on exertion.  She last seen in the office on 08/30/2021 and reported ongoing dyspnea on exertion, particular when climbing stairs.Repeat echocardiogram in 08/2021 showed EF 60 to 65%, mild to moderate LVH, G1 DD.  CTA showed calcium score of 0, no evidence of CAD.  She presents today for follow-up.  Since her last visit he has done well from a cardiac standpoint.  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  Overall, she reports feeling well and denies any new concerns today. ? ?Home Medications  ?  ?Current Outpatient Medications  ?Medication Sig Dispense Refill  ? ferrous sulfate 325 (65 FE) MG tablet Take 325 mg by mouth daily with breakfast.    ? Garlic 235 MG TABS Take 1 tablet by mouth daily.    ? levothyroxine (SYNTHROID, LEVOTHROID) 75 MCG tablet Take 75 mcg by mouth daily.    ? losartan-hydrochlorothiazide (HYZAAR) 50-12.5 MG per tablet Take 2 tablets by mouth daily.     ? MAGNESIUM PO Take 1 tablet by mouth daily.    ? Multiple Vitamin (MULTIVITAMIN)  tablet Take 1 tablet by mouth daily.    ? naproxen (NAPROSYN) 500 MG tablet Take 500 mg by mouth 2 (two) times daily as needed.    ? Omega-3 Fatty Acids (FISH OIL PO) Take 1 tablet by mouth daily.    ? OVER THE COUNTER MEDICATION Take 1 tablet by mouth daily. Med Name: Zyflamend    ? UNABLE TO FIND Take 1 tablet by mouth daily. Med Name: Vital Reds    ? UNABLE TO FIND Take 1 tablet by mouth daily. Med Name: Total Restore    ? UNABLE TO FIND Bio complete 3-daily    ? Vitamin D, Ergocalciferol, (DRISDOL) 1.25 MG (50000 UNIT) CAPS capsule Take 1 capsule (50,000 Units total) by mouth every 7 (seven) days. 12 capsule 0  ? metoprolol tartrate (LOPRESSOR) 50 MG tablet  Take 1 tablet 2 hour prior to scan (Patient not taking: Reported on 10/11/2021) 1 tablet 0  ? oxyCODONE-acetaminophen (PERCOCET) 5-325 MG tablet Take 1 tablet by mouth every 6 (six) hours as needed. (Patient not taking: Reported on 10/11/2021) 20 tablet 0  ? Semaglutide,0.25 or 0.'5MG'$ /DOS, (OZEMPIC, 0.25 OR 0.5 MG/DOSE,) 2 MG/1.5ML SOPN Inject 0.5 mg into the skin once a week. 3 mL 0  ? traMADol (ULTRAM) 50 MG tablet Take 50 mg by mouth 2 (two) times daily as needed. (Patient not taking: Reported on 10/11/2021)    ? valACYclovir (VALTREX) 1000 MG tablet Take 1,000 mg by mouth 2 (two) times daily. (Patient not taking: Reported on 10/11/2021)    ? ?No current facility-administered medications for this visit.  ?  ? ?Review of Systems  ?  ?She denies chest pain, palpitations, dyspnea, pnd, orthopnea, n, v, dizziness, syncope, edema, weight gain, or early satiety. All other systems reviewed and are otherwise negative except as noted above.  ? ?Physical Exam  ?  ?VS:  BP 132/76   Pulse 73   Ht '5\' 4"'$  (1.626 m)   Wt 222 lb 12.8 oz (101.1 kg)   SpO2 100%   BMI 38.24 kg/m?   ?GEN: Well nourished, well developed, in no acute distress. ?HEENT: normal. ?Neck: Supple, no JVD, carotid bruits, or masses. ?Cardiac: RRR, no murmurs, rubs, or gallops. No clubbing, cyanosis, mild non-pitting bilateral lower extremity edema.  Radials/DP/PT 2+ and equal bilaterally.  ?Respiratory:  Respirations regular and unlabored, clear to auscultation bilaterally. ?GI: Soft, nontender, nondistended, BS + x 4. ?MS: no deformity or atrophy. ?Skin: warm and dry, no rash. ?Neuro:  Strength and sensation are intact. ?Psych: Normal affect. ? ?Accessory Clinical Findings  ?  ?ECG personally reviewed by me today - No EKG in office today. ? ?Lab Results  ?Component Value Date  ? WBC 6.0 02/18/2021  ? HGB 11.8 02/18/2021  ? HCT 38.6 02/18/2021  ? MCV 73.7 (L) 02/18/2021  ? PLT 242 02/18/2021  ? ?Lab Results  ?Component Value Date  ? CREATININE 0.93 09/10/2021  ?  BUN 14 09/10/2021  ? NA 145 (H) 09/10/2021  ? K 4.7 09/10/2021  ? CL 107 (H) 09/10/2021  ? CO2 26 09/10/2021  ? ?Lab Results  ?Component Value Date  ? ALT 14 02/18/2021  ? AST 26 02/18/2021  ? ALKPHOS 83 10/09/2020  ? BILITOT 0.9 02/18/2021  ? ?Lab Results  ?Component Value Date  ? CHOL 178 02/18/2021  ? HDL 47 (L) 02/18/2021  ? LDLCALC 114 (H) 02/18/2021  ? TRIG 76 02/18/2021  ? CHOLHDL 3.8 02/18/2021  ?  ?No results found for: HGBA1C ? ?Assessment &  Plan  ?  ?1. Dyspnea on exertion/grade 1 diastolic dysfunction: Most recent echo showed EF 60 to 65%, mild to moderate LVH, G1 DD. Coronary CTA showed no evidence of CAD.  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 She does have chronic mild bilateral non-pitting lower extremity edema in the setting of chronic venous insuffiencey. Euvolemic and well compensated on exam. Recent ischemic work-up very reassuring.  ? ?2. Hypertension: BP well controlled. Continue current antihypertensive regimen.  ?  ?3. Hyperlipidemia: LDL was 114 in September 2022.  Encouraged ongoing lifestyle modifications with diet and exercise. ?  ?4. Chronic venous insufficiency: Chronic bilateral pedal edema. Stable. Encouraged elevation, compression. ?  ?5.  Obesity: Follows [facility].  On Ozempic. Encouraged ongoing lifestyle modifications with diet and exercise. ? ?6. Disposition: F/u in 1 year.  ? ?Lenna Sciara, NP ?10/11/2021, 3:53 PM ?  ?

## 2021-10-13 ENCOUNTER — Telehealth (INDEPENDENT_AMBULATORY_CARE_PROVIDER_SITE_OTHER): Payer: Self-pay | Admitting: Family Medicine

## 2021-10-13 NOTE — Telephone Encounter (Signed)
CovermyMeds called for prior authorization for Ozempic. Ref number [id number]. From previous communication, this was denied in April.  ?

## 2021-10-13 NOTE — Telephone Encounter (Signed)
No further action needed. Patient was denied Ozempic.  ?

## 2022-01-03 ENCOUNTER — Other Ambulatory Visit: Payer: Self-pay | Admitting: Internal Medicine

## 2022-01-03 DIAGNOSIS — Z1231 Encounter for screening mammogram for malignant neoplasm of breast: Secondary | ICD-10-CM

## 2022-01-04 ENCOUNTER — Ambulatory Visit: Payer: PPO

## 2022-01-06 ENCOUNTER — Ambulatory Visit: Payer: Medicare HMO | Admitting: Cardiology

## 2022-01-12 ENCOUNTER — Ambulatory Visit: Payer: PPO

## 2022-01-19 ENCOUNTER — Encounter (INDEPENDENT_AMBULATORY_CARE_PROVIDER_SITE_OTHER): Payer: Self-pay

## 2022-01-20 ENCOUNTER — Ambulatory Visit: Payer: PPO

## 2022-05-04 ENCOUNTER — Ambulatory Visit
Admission: RE | Admit: 2022-05-04 | Discharge: 2022-05-04 | Disposition: A | Discharge status: Home / Self Care | Payer: PPO | Source: Ambulatory Visit | Attending: Internal Medicine | Admitting: Internal Medicine

## 2022-05-04 DIAGNOSIS — Z1231 Encounter for screening mammogram for malignant neoplasm of breast: Secondary | ICD-10-CM

## 2022-07-07 ENCOUNTER — Other Ambulatory Visit: Payer: Self-pay | Admitting: Internal Medicine

## 2022-07-08 LAB — COMPLETE METABOLIC PANEL WITH GFR
AG Ratio: 1.5 (calc) (ref 1.0–2.5)
ALT: 10 U/L (ref 6–29)
AST: 15 U/L (ref 10–35)
Albumin: 4.1 g/dL (ref 3.6–5.1)
Alkaline phosphatase (APISO): 64 U/L (ref 37–153)
BUN: 20 mg/dL (ref 7–25)
CO2: 24 mmol/L (ref 20–32)
Calcium: 9.2 mg/dL (ref 8.6–10.4)
Chloride: 105 mmol/L (ref 98–110)
Creat: 0.84 mg/dL (ref 0.60–0.95)
Globulin: 2.8 g/dL (calc) (ref 1.9–3.7)
Glucose, Bld: 80 mg/dL (ref 65–99)
Potassium: 4.3 mmol/L (ref 3.5–5.3)
Sodium: 140 mmol/L (ref 135–146)
Total Bilirubin: 0.5 mg/dL (ref 0.2–1.2)
Total Protein: 6.9 g/dL (ref 6.1–8.1)
eGFR: 70 mL/min/{1.73_m2} (ref >=60)

## 2022-07-08 LAB — LIPID PANEL
Cholesterol: 185 mg/dL (ref <200)
HDL: 44 mg/dL — ABNORMAL LOW (ref >=50)
LDL Cholesterol (Calc): 116 mg/dL (calc) — ABNORMAL HIGH
Non-HDL Cholesterol (Calc): 141 mg/dL (calc) — ABNORMAL HIGH (ref <130)
Total CHOL/HDL Ratio: 4.2 (calc) (ref <5.0)
Triglycerides: 132 mg/dL (ref <150)

## 2022-07-08 LAB — CBC
HCT: 37.2 % (ref 35.0–45.0)
Hemoglobin: 11.7 g/dL (ref 11.7–15.5)
MCH: 22.9 pg — ABNORMAL LOW (ref 27.0–33.0)
MCHC: 31.5 g/dL — ABNORMAL LOW (ref 32.0–36.0)
MCV: 72.9 fL — ABNORMAL LOW (ref 80.0–100.0)
MPV: 11.7 fL (ref 7.5–12.5)
Platelets: 268 10*3/uL (ref 140–400)
RBC: 5.1 10*6/uL (ref 3.80–5.10)
RDW: 15 % (ref 11.0–15.0)
WBC: 7.5 10*3/uL (ref 3.8–10.8)

## 2022-07-08 LAB — TSH: TSH: 2.1 mIU/L (ref 0.40–4.50)

## 2022-07-08 LAB — T4, FREE: Free T4: 1.3 ng/dL (ref 0.8–1.8)

## 2022-12-01 ENCOUNTER — Other Ambulatory Visit: Payer: Self-pay | Admitting: Internal Medicine

## 2022-12-01 DIAGNOSIS — Z1231 Encounter for screening mammogram for malignant neoplasm of breast: Secondary | ICD-10-CM

## 2023-01-16 ENCOUNTER — Ambulatory Visit (INDEPENDENT_AMBULATORY_CARE_PROVIDER_SITE_OTHER): Payer: Medicare Other

## 2023-01-16 ENCOUNTER — Ambulatory Visit: Payer: Medicare Other | Admitting: Podiatry

## 2023-01-16 DIAGNOSIS — M21619 Bunion of unspecified foot: Secondary | ICD-10-CM

## 2023-01-16 DIAGNOSIS — M21611 Bunion of right foot: Secondary | ICD-10-CM | POA: Diagnosis not present

## 2023-01-16 DIAGNOSIS — M2041 Other hammer toe(s) (acquired), right foot: Secondary | ICD-10-CM

## 2023-01-16 NOTE — Patient Instructions (Signed)
Pre-Operative Instructions  Congratulations, you have decided to take an important step to improving your quality of life.  You can be assured that the doctors of [facility] will be with you every step of the way.  Plan to be at the surgery center/hospital at least 1 (one) hour prior to your scheduled time unless otherwise directed by the surgical center/hospital staff.  You must have a responsible adult accompany you, remain during the surgery and drive you home.  Make sure you have directions to the surgical center/hospital and know how to get there on time. For hospital based surgery you will need to obtain a history and physical form from your family physician within 1 month prior to the date of surgery- we will give you a form for you primary physician.  We make every effort to accommodate the date you request for surgery.  There are however, times where surgery dates or times have to be moved.  We will contact you as soon as possible if a change in schedule is required.   No Aspirin/Ibuprofen for one week before surgery.  If you are on aspirin, any non-steroidal anti-inflammatory medications (Mobic, Aleve, Ibuprofen) you should stop taking it 7 days prior to your surgery.  You make take Tylenol  For pain prior to surgery.  Medications- If you are taking daily heart and blood pressure medications, seizure, reflux, allergy, asthma, anxiety, pain or diabetes medications, make sure the surgery center/hospital is aware before the day of surgery so they may notify you which medications to take or avoid the day of surgery. No food or drink after midnight the night before surgery unless directed otherwise by surgical center/hospital staff. No alcoholic beverages 24 hours prior to surgery.  No smoking 24 hours prior to or 24 hours after surgery. Wear loose pants or shorts- loose enough to fit over bandages, boots, and casts. No slip on shoes, sneakers are best. Bring your boot with you to the  surgery center/hospital.  Also bring crutches or a walker if your physician has prescribed it for you.  If you do not have this equipment, it will be provided for you after surgery. If you have not been contracted by the surgery center/hospital by the day before your surgery, call to confirm the date and time of your surgery. Leave-time from work may vary depending on the type of surgery you have.  Appropriate arrangements should be made prior to surgery with your employer. Prescriptions will be provided immediately following surgery by your doctor.  Have these filled as soon as possible after surgery and take the medication as directed. Remove nail polish on the operative foot. Wash the night before surgery.  The night before surgery wash the foot and leg well with the antibacterial soap provided and water paying special attention to beneath the toenails and in between the toes.  Rinse thoroughly with water and dry well with a towel.  Perform this wash unless told not to do so by your physician.  Enclosed: 1 Ice pack (please put in freezer the night before surgery)   1 Hibiclens skin cleaner   Pre-op Instructions  If you have any questions regarding the instructions, do not hesitate to call our office at any point during this process.   [place]: [address] [place], [place] [address] [phone]  [place]: [address] [place], [place] [address] [phone]  Dr.  , DPM  

## 2023-01-16 NOTE — Progress Notes (Signed)
Subjective: Chief Complaint  Patient presents with   Foot Problem    bilateral   82 year old female presents after the above concerns.  She states the right foot but he is getting worse and she gets stabbing pain to this area.  She tried shoe modifications, offloading, padding not significant improvement she wants to discuss surgical intervention today.  Objective: AAO x3, NAD DP/PT pulses palpable bilaterally, CRT less than 3 seconds Moderate bunions present.  There is tenderness palpation to the on the bunion itself.  There is no hypermobility present of the first ray.  Hammertoes present of the second digit with irritation along the dorsal PIPJ from rubbing inside shoes No pain with calf compression, swelling, warmth, erythema  Assessment: Right foot bunion, hammertoe  Plan: -All treatment options discussed with the patient including all alternatives, risks, complications.  -X-rays obtained reviewed.  3 views of the feet were obtained.  Moderate bunion is present with increase in the first, second metatarsal angle.  Hammertoe present. -We discussed both conservative as well as surgical treatment options.  She has attempted numerous conservative treatments and she was proceed with surgical intervention.  We discussed different types of bunion surgery and she brought up Lapiplasty. We discussed this procedure.  Decided to proceed Serafina Royals with hammertoe repair of the second digit. -The incision placement as well as the postoperative course was discussed with the patient. I discussed risks of the surgery which include, but not limited to, infection, bleeding, pain, swelling, need for further surgery, delayed or nonhealing, painful or ugly scar, numbness or sensation changes, over/under correction, recurrence, transfer lesions, further deformity, hardware failure, DVT/PE, loss of toe/foot. Patient understands these risks and wishes to proceed with surgery. The surgical consent was  reviewed with the patient all 3 pages were signed. No promises or guarantees were given to the outcome of the procedure. All questions were answered to the best of my ability. Before the surgery the patient was encouraged to call the office if there is any further questions. The surgery will be performed at the [facility] on an outpatient basis. -Patient encouraged to call the office with any questions, concerns, change in symptoms.   Vivi Barrack DPM

## 2023-01-18 ENCOUNTER — Ambulatory Visit: Payer: PPO | Attending: Nurse Practitioner | Admitting: Nurse Practitioner

## 2023-01-18 NOTE — Progress Notes (Deleted)
Office Visit    Patient Name: Erin Vazquez Date of Encounter: 01/18/2023  Primary Care Provider:  Fleet Contras, MD Primary Cardiologist:  Olga Millers, MD  Chief Complaint    82 year old female with a history of hypertension, aortic atherosclerosis on CT, chronic venous insufficiency, osteoarthritis, and hypothyroidism who presents for follow-up related to dyspnea on exertion.   Past Medical History    Past Medical History:  Diagnosis Date   Adrenal nodule (HCC)    Arthritis    Back pain    Bunion, left foot    Eczema    Fibrocystic breast    Left   Fibroids, endometrial and uterine    Gallstones    Genital herpes    Hammer toe of left foot    Heart valve problem    History of cardiomegaly    History of colonic diverticulitis    History of iron deficiency anemia    HTN (hypertension)    Hypothyroid    Joint pain    Lower extremity edema    Mild calcific aortic stenosis    Osteoarthritis    Ovarian cyst    pt unaware   Pre-diabetes    Sigmoid diverticulosis    Venous insufficiency    Past Surgical History:  Procedure Laterality Date   BREAST EXCISIONAL BIOPSY Left 1979   BREAST RECONSTRUCTION  1976   BREAST SURGERY Left 1961   Removed 3 fibroadenoma    CERVICAL CONE BIOPSY  1981   KNEE ARTHROSCOPY Left 2010 Dr. Romeo Apple   OPEN REDUCTION INTERNAL FIXATION (ORIF) DISTAL RADIAL FRACTURE Left 04/24/2018   Procedure: OPEN REDUCTION INTERNAL FIXATION (ORIF) LEFT DISTAL RADIAL FRACTURE;  Surgeon: Sheral Apley, MD;  Location: [facility] [place];  Service: Orthopedics;  Laterality: Left;   REDUCTION MAMMAPLASTY Left    Reduction to left breast   SHOULDER SURGERY Right 1976, 11/2017   lipoma excision   TUBAL LIGATION     venaseal      Allergies  No Known Allergies   Labs/Other Studies Reviewed    The following studies were reviewed today:  Cardiac Studies & Procedures       ECHOCARDIOGRAM  ECHOCARDIOGRAM COMPLETE  09/06/2021  Narrative ECHOCARDIOGRAM REPORT    Patient Name:   [name] Date of Exam: 09/06/2021 Medical Rec #:  [id number]            Height:       64.0 in Accession #:    [id number]           Weight:       224.6 lb Date of Birth:  02/10/1941             BSA:          2.055 m Patient Age:    81 years             BP:           130/70 mmHg Patient Gender: F                    HR:           57 bpm. Exam Location:  [address]  Procedure: 2D Echo, Cardiac Doppler and Color Doppler  Indications:    R06.09 SOB  History:        Patient has prior history of Echocardiogram examinations, most recent 01/17/2019. Signs/Symptoms:Shortness of Breath; Risk Factors:Hypertension and Dyslipidemia.  Sonographer:    Samule Ohm RDCS Referring Phys: [phone]   C   IMPRESSIONS   1. Left ventricular ejection fraction, by estimation, is 60 to 65%. The left ventricle has normal function. The left ventricle has no regional wall motion abnormalities. There is moderate hypertrophy of the basal-septum. The rest of the LV segments demonstrate mild left ventricular hypertrophy. Left ventricular diastolic parameters are consistent with Grade I diastolic dysfunction (impaired relaxation). 2. Right ventricular systolic function is normal. The right ventricular size is normal. 3. The mitral valve is degenerative. Trivial mitral valve regurgitation. 4. There is a highly mobile, echogenic structure visualized on the ventricular side of the tricuspid valve likely representing a calcified chord (incidental finding). Low suspicion of endocarditis. Would recommend clinical correlation. 5. The aortic valve is tricuspid. There is mild calcification of the aortic valve. There is mild thickening of the aortic valve. Aortic valve regurgitation is not visualized. Aortic valve sclerosis/calcification is present, without any evidence of aortic stenosis. 6. Aortic dilatation noted. There is borderline  dilatation of the aortic root, measuring 37 mm. 7. The inferior vena cava is normal in size with greater than 50% respiratory variability, suggesting right atrial pressure of 3 mmHg.  Comparison(s): No significant change from prior study.  FINDINGS Left Ventricle: Left ventricular ejection fraction, by estimation, is 60 to 65%. The left ventricle has normal function. The left ventricle has no regional wall motion abnormalities. The left ventricular internal cavity size was normal in size. There is moderate hypertrophy of the basal-septum. The rest of the LV segments demonstrate mild left ventricular hypertrophy. Left ventricular diastolic parameters are consistent with Grade I diastolic dysfunction (impaired relaxation).  Right Ventricle: The right ventricular size is normal. No increase in right ventricular wall thickness. Right ventricular systolic function is normal.  Left Atrium: Left atrial size was normal in size.  Right Atrium: Right atrial size was normal in size.  Pericardium: There is no evidence of pericardial effusion.  Mitral Valve: The mitral valve is degenerative in appearance. There is mild thickening of the mitral valve leaflet(s). There is mild calcification of the mitral valve leaflet(s). Mild to moderate mitral annular calcification. Trivial mitral valve regurgitation.  Tricuspid Valve: There is a highly mobile, echogenic structure visualized on the ventricular side of the tricuspid valve likely representing a calcified chord (incidental finding). Low suspicion of endocarditis. Would recommend clinical correlation. The tricuspid valve is normal in structure. Tricuspid valve regurgitation is trivial.  Aortic Valve: The aortic valve is tricuspid. There is mild calcification of the aortic valve. There is mild thickening of the aortic valve. Aortic valve regurgitation is not visualized. Aortic valve sclerosis/calcification is present, without any evidence of aortic  stenosis.  Pulmonic Valve: The pulmonic valve was normal in structure. Pulmonic valve regurgitation is trivial.  Aorta: Aortic dilatation noted. There is borderline dilatation of the aortic root, measuring 37 mm.  Venous: The inferior vena cava is normal in size with greater than 50% respiratory variability, suggesting right atrial pressure of 3 mmHg.  IAS/Shunts: The atrial septum is grossly normal.   LEFT VENTRICLE PLAX 2D LVIDd:         4.50 cm   Diastology LVIDs:         2.50 cm   LV e' medial:    7.07 cm/s LV PW:         1.30 cm   LV E/e' medial:  11.3 LV IVS:        1.50 cm   LV e' lateral:   8.49 cm/s LVOT diam:     1.90 cm  LV E/e' lateral: 9.4 LV SV:         56 LV SV Index:   27 LVOT Area:     2.84 cm   RIGHT VENTRICLE            IVC TAPSE (M-mode): 2.7 cm     IVC diam: 0.80 cm RVSP:           18.4 mmHg  LEFT ATRIUM             Index        RIGHT ATRIUM           Index LA diam:        3.40 cm 1.65 cm/m   RA Pressure: 3.00 mmHg LA Vol (A2C):   59.9 ml 29.14 ml/m  RA Area:     14.10 cm LA Vol (A4C):   65.5 ml 31.87 ml/m  RA Volume:   33.90 ml  16.49 ml/m LA Biplane Vol: 69.1 ml 33.62 ml/m AORTIC VALVE LVOT Vmax:   93.40 cm/s LVOT Vmean:  61.700 cm/s LVOT VTI:    0.199 m  AORTA Ao Root diam: 3.70 cm Ao Asc diam:  2.90 cm  MITRAL VALVE                TRICUSPID VALVE MV Area (PHT): 2.04 cm     TR Peak grad:   15.4 mmHg MV Decel Time: 372 msec     TR Vmax:        196.00 cm/s MV E velocity: 79.70 cm/s   Estimated RAP:  3.00 mmHg MV A velocity: 113.00 cm/s  RVSP:           18.4 mmHg MV E/A ratio:  0.71 SHUNTS Systemic VTI:  0.20 m Systemic Diam: 1.90 cm  Laurance Flatten MD Electronically signed by Laurance Flatten MD Signature Date/Time: 09/06/2021/5:24:19 PM    Final     CT SCANS  CT CORONARY MORPH W/CTA COR W/SCORE 09/13/2021  Addendum 09/13/2021  6:52 PM ADDENDUM REPORT: 09/13/2021 18:50  CLINICAL DATA:  20F with hypertension, aortic  atherosclerosis and dyspnea.  EXAM: Cardiac/Coronary  CT  TECHNIQUE: The patient was scanned on a [employer].  FINDINGS: A 120 kV prospective scan was triggered in the descending thoracic aorta at 111 HU's. Axial non-contrast 3 mm slices were carried out through the heart. The data set was analyzed on a dedicated work station and scored using the Agatson method. Gantry rotation speed was 250 msecs and collimation was .6 mm. No beta blockade and 0.8 mg of sl NTG was given. The 3D data set was reconstructed in 5% intervals of the 67-82 % of the R-R cycle. Diastolic phases were analyzed on a dedicated work station using MPR, MIP and VRT modes. The patient received 80 cc of contrast.  Aorta: Normal size. Ascending aorta 3.0 cm. Mild calcification of the aortic root. No dissection.  Aortic Valve: Trileaflet.  Mild calcification.  Coronary Arteries:  Normal coronary origin.  Right dominance.  RCA is a large dominant artery that gives rise to PDA and PLVB. There is no plaque.  Left main is a large artery that gives rise to LAD and LCX arteries.  LAD is a large vessel that has no plaque.  LCX is a non-dominant artery that gives rise to a large OM1 and small OM2 branch. There is no plaque.  Coronary Calcium Score: 0  Percentile: 0  Other findings:  Normal pulmonary vein drainage into the left atrium.  Normal let atrial  appendage without a thrombus.  Normal size of the pulmonary artery.  IMPRESSION: 1. Coronary calcium score of 0. This was 0 percentile for age-, race-, and sex-matched controls.  2. Normal coronary origin with right dominance.  3. No evidence of CAD.  CAD-RADS 0.  4. Mild calcification of the aortic valve.  5. Mild calcification of the aortic root.  Chilton Si, MD   Electronically Signed By: Chilton Si M.D. On: 09/13/2021 18:50  Narrative EXAM: OVER-READ INTERPRETATION  CT CHEST  The following report is an  over-read performed by radiologist Dr. Caprice Renshaw of [facility] on 09/13/2021. This over-read does not include interpretation of cardiac or coronary anatomy or pathology. The coronary CTA interpretation by the cardiologist is attached.  COMPARISON:  None.  FINDINGS: Vascular: No extracardiac significant vascular findings. Trace pericardial fluid.  Mediastinum/Nodes: No lymphadenopathy.  Lungs/Pleura: Bibasilar hypoventilatory changes. No focal airspace disease. No suspicious pulmonary nodules within the field of view.  Upper Abdomen: No acute abnormality.  Musculoskeletal: No acute osseous abnormality. Old right anterior rib injury. No suspicious lytic or blastic lesions. Multilevel degenerative changes of the spine with prominent right anterolateral osteophyte formation.  IMPRESSION: No acute extracardiac findings in the chest. Interpretation of cardiac/coronary anatomy by cardiology to follow.  Electronically Signed: By: Caprice Renshaw M.D. On: 09/13/2021 16:13         Recent Labs: 07/07/2022: ALT 10; BUN 20; Creat 0.84; Hemoglobin 11.7; Platelets 268; Potassium 4.3; Sodium 140; TSH 2.10  Recent Lipid Panel    Component Value Date/Time   CHOL 185 07/07/2022 1700   CHOL 200 (H) 10/09/2020 1134   TRIG 132 07/07/2022 1700   HDL 44 (L) 07/07/2022 1700   HDL 48 10/09/2020 1134   CHOLHDL 4.2 07/07/2022 1700   VLDL 20 12/26/2007 0040   LDLCALC 116 (H) 07/07/2022 1700    History of Present Illness    82 year old female with the above past medical history including hypertension, hyperlipidemia, aortic atherosclerosis on CT, chronic venous insufficiency, osteoarthritis, and hypothyroidism.   Echocardiogram in 2020 showed normal LV function, G1 DD, mild LAE.  Aortic atherosclerosis noted on previous abdominal CT. Marland KitchenRepeat echocardiogram in 08/2021 in the setting of dyspnea on exertion showed EF 60 to 65%, mild to moderate LVH, G1 DD.  Coronary CTA showed calcium score  of 0, no evidence of CAD.  She was last seen in the office on 10/11/2021 and was doing well from a cardiac standpoint.  She noted mild dyspnea when climbing stairs, denies any other symptoms concerning for angina.   She presents today for follow-up.  Since her last visit she has   1. Dyspnea on exertion/grade 1 diastolic dysfunction: Most recent echo showed EF 60 to 65%, mild to moderate LVH, G1 DD. Coronary CTA showed no evidence of CAD.  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 She does have chronic mild bilateral non-pitting lower extremity edema in the setting of chronic venous insuffiencey. Euvolemic and well compensated on exam. Recent ischemic work-up very reassuring.   2. Hypertension: BP well controlled. Continue current antihypertensive regimen.    3. Hyperlipidemia: LDL was 114 in September 2022.  Encouraged ongoing lifestyle modifications with diet and exercise.   4. Chronic venous insufficiency: Chronic bilateral pedal edema. Stable. Encouraged elevation, compression.   5.  Obesity: Follows at healthy weight [facility] wellness clinic.  On Ozempic. Encouraged ongoing lifestyle modifications with diet  and exercise.   6. Disposition: F/u i  Home Medications    Current Outpatient Medications  Medication Sig Dispense Refill   ferrous sulfate 325 (65 FE) MG tablet Take 325 mg by mouth daily with breakfast.     Garlic 100 MG TABS Take 1 tablet by mouth daily.     levothyroxine (SYNTHROID, LEVOTHROID) 75 MCG tablet Take 75 mcg by mouth daily.     losartan-hydrochlorothiazide (HYZAAR) 50-12.5 MG per tablet Take 2 tablets by mouth daily.      MAGNESIUM PO Take 1 tablet by mouth daily.     metoprolol tartrate (LOPRESSOR) 50 MG tablet Take 1 tablet 2 hour prior to scan (Patient not taking: Reported on 10/11/2021) 1 tablet 0   Multiple Vitamin (MULTIVITAMIN) tablet Take 1 tablet by  mouth daily.     naproxen (NAPROSYN) 500 MG tablet Take 500 mg by mouth 2 (two) times daily as needed.     Omega-3 Fatty Acids (FISH OIL PO) Take 1 tablet by mouth daily.     OVER THE COUNTER MEDICATION Take 1 tablet by mouth daily. Med Name: Zyflamend     oxyCODONE-acetaminophen (PERCOCET) 5-325 MG tablet Take 1 tablet by mouth every 6 (six) hours as needed. (Patient not taking: Reported on 10/11/2021) 20 tablet 0   Semaglutide,0.25 or 0.5MG /DOS, (OZEMPIC, 0.25 OR 0.5 MG/DOSE,) 2 MG/1.5ML SOPN Inject 0.5 mg into the skin once a week. 3 mL 0   traMADol (ULTRAM) 50 MG tablet Take 50 mg by mouth 2 (two) times daily as needed. (Patient not taking: Reported on 10/11/2021)     UNABLE TO FIND Take 1 tablet by mouth daily. Med Name: Vital Reds     UNABLE TO FIND Take 1 tablet by mouth daily. Med Name: Total Restore     UNABLE TO FIND Bio complete 3-daily     valACYclovir (VALTREX) 1000 MG tablet Take 1,000 mg by mouth 2 (two) times daily. (Patient not taking: Reported on 10/11/2021)     Vitamin D, Ergocalciferol, (DRISDOL) 1.25 MG (50000 UNIT) CAPS capsule Take 1 capsule (50,000 Units total) by mouth every 7 (seven) days. 12 capsule 0   No current facility-administered medications for this visit.     Review of Systems    ***.  All other systems reviewed and are otherwise negative except as noted above.    Physical Exam    VS:  There were no vitals taken for this visit. , BMI There is no height or weight on file to calculate BMI.     GEN: Well nourished, well developed, in no acute distress. HEENT: normal. Neck: Supple, no JVD, carotid bruits, or masses. Cardiac: RRR, no murmurs, rubs, or gallops. No clubbing, cyanosis, edema.  Radials/DP/PT 2+ and equal bilaterally.  Respiratory:  Respirations regular and unlabored, clear to auscultation bilaterally. GI: Soft, nontender, nondistended, BS + x 4. MS: no deformity or atrophy. Skin: warm and dry, no rash. Neuro:  Strength and sensation are  intact. Psych: Normal affect.  Accessory Clinical Findings    ECG personally reviewed by me today -    - no acute changes.   Lab Results  Component Value Date   WBC 7.5 07/07/2022   HGB 11.7 07/07/2022   HCT 37.2 07/07/2022   MCV 72.9 (L) 07/07/2022   PLT 268 07/07/2022   Lab Results  Component Value Date   CREATININE 0.84 07/07/2022   BUN 20 07/07/2022   NA 140 07/07/2022   K 4.3 07/07/2022   CL 105 07/07/2022  CO2 24 07/07/2022   Lab Results  Component Value Date   ALT 10 07/07/2022   AST 15 07/07/2022   ALKPHOS 83 10/09/2020   BILITOT 0.5 07/07/2022   Lab Results  Component Value Date   CHOL 185 07/07/2022   HDL 44 (L) 07/07/2022   LDLCALC 116 (H) 07/07/2022   TRIG 132 07/07/2022   CHOLHDL 4.2 07/07/2022    No results found for: "HGBA1C"  Assessment & Plan    1.  ***  No BP recorded.  {Refresh Note OR Click here to enter BP  :1}***   Joylene Grapes, NP 01/18/2023, 5:58 AM

## 2023-04-20 ENCOUNTER — Telehealth: Payer: Self-pay | Admitting: Podiatry

## 2023-04-20 NOTE — Telephone Encounter (Signed)
DOS-05/17/2023  [place] BUNIONECTOMY [id number] HAMMERTOE REPAIR 2ND [id number]  [facility] EFFECTIVE DATE-08/12/2022  DEDUCTIBLE- $0.00 WITH REMAINING $0.00 OOP- $3600.00 WITH REMAINING $3545.00 COINSURANCE- 0%  PER THE [employer] WEBSITE PORTAL, PRIOR AUTH IS NOT REQUIRED FOR CPT CODES 62130 AND [phone]. GOOD FROM 05/17/2023 - 06/13/2023  AUTH Decision ID #: [id number]

## 2023-05-04 ENCOUNTER — Telehealth: Payer: Self-pay

## 2023-05-04 NOTE — Telephone Encounter (Signed)
Erin Vazquez called to cancel her surgery with Dr. Ardelle Anton on 05/17/2023. She stated she has some family issues she needs to take care of first. She will call back to reschedule. Notified Dr. Ardelle Anton and Aram Beecham at [facility].

## 2023-05-08 ENCOUNTER — Ambulatory Visit
Admission: RE | Admit: 2023-05-08 | Discharge: 2023-05-08 | Disposition: A | Discharge status: Home / Self Care | Payer: Medicare Other | Source: Ambulatory Visit | Attending: Internal Medicine | Admitting: Internal Medicine

## 2023-05-08 DIAGNOSIS — Z1231 Encounter for screening mammogram for malignant neoplasm of breast: Secondary | ICD-10-CM

## 2023-05-22 ENCOUNTER — Encounter: Payer: Medicare Other | Admitting: Podiatry

## 2023-06-01 ENCOUNTER — Encounter: Payer: Medicare Other | Admitting: Podiatry

## 2023-06-15 ENCOUNTER — Encounter: Payer: Medicare Other | Admitting: Podiatry

## 2024-03-25 ENCOUNTER — Other Ambulatory Visit: Payer: Self-pay | Admitting: Internal Medicine

## 2024-03-25 DIAGNOSIS — Z1231 Encounter for screening mammogram for malignant neoplasm of breast: Secondary | ICD-10-CM

## 2024-05-08 ENCOUNTER — Ambulatory Visit
Admission: RE | Admit: 2024-05-08 | Discharge: 2024-05-08 | Disposition: A | Source: Ambulatory Visit | Attending: Internal Medicine | Admitting: Internal Medicine

## 2024-05-08 DIAGNOSIS — Z1231 Encounter for screening mammogram for malignant neoplasm of breast: Secondary | ICD-10-CM

## 2024-05-13 ENCOUNTER — Other Ambulatory Visit: Payer: Self-pay | Admitting: Medical Genetics

## 2024-07-18 ENCOUNTER — Other Ambulatory Visit: Payer: Self-pay | Admitting: Medical Genetics

## 2024-07-18 DIAGNOSIS — Z006 Encounter for examination for normal comparison and control in clinical research program: Secondary | ICD-10-CM
